# Patient Record
Sex: Female | Born: 1985 | Race: Black or African American | Hispanic: No | Marital: Married | Smoking: Never smoker
Health system: Southern US, Community
[De-identification: ages and names within clinical notes are randomized; demographics above are authoritative.]

## PROBLEM LIST (undated history)

## (undated) DIAGNOSIS — T7840XA Allergy, unspecified, initial encounter: Secondary | ICD-10-CM

## (undated) DIAGNOSIS — E119 Type 2 diabetes mellitus without complications: Secondary | ICD-10-CM

## (undated) DIAGNOSIS — J40 Bronchitis, not specified as acute or chronic: Secondary | ICD-10-CM

## (undated) DIAGNOSIS — I10 Essential (primary) hypertension: Secondary | ICD-10-CM

## (undated) HISTORY — DX: Essential (primary) hypertension: I10

## (undated) HISTORY — DX: Type 2 diabetes mellitus without complications: E11.9

## (undated) HISTORY — DX: Allergy, unspecified, initial encounter: T78.40XA

---

## 2011-05-17 ENCOUNTER — Encounter (HOSPITAL_COMMUNITY): Payer: Self-pay

## 2011-05-17 ENCOUNTER — Emergency Department (INDEPENDENT_AMBULATORY_CARE_PROVIDER_SITE_OTHER)
Admission: EM | Admit: 2011-05-17 | Discharge: 2011-05-17 | Disposition: A | Discharge status: Home / Self Care | Payer: Self-pay | Source: Home / Self Care | Attending: Emergency Medicine | Admitting: Emergency Medicine

## 2011-05-17 DIAGNOSIS — T148XXA Other injury of unspecified body region, initial encounter: Secondary | ICD-10-CM

## 2011-05-17 DIAGNOSIS — J45990 Exercise induced bronchospasm: Secondary | ICD-10-CM

## 2011-05-17 HISTORY — DX: Bronchitis, not specified as acute or chronic: J40

## 2011-05-17 MED ORDER — CYCLOBENZAPRINE HCL 10 MG PO TABS: 10.0000 mg | ORAL_TABLET | Freq: Three times a day (TID) | ORAL | Status: AC | PRN

## 2011-05-17 MED ORDER — IBUPROFEN 600 MG PO TABS: 600.0000 mg | ORAL_TABLET | Freq: Four times a day (QID) | ORAL | Status: AC | PRN

## 2011-05-17 MED ORDER — ALBUTEROL SULFATE HFA 108 (90 BASE) MCG/ACT IN AERS: 1.0000 | INHALATION_SPRAY | Freq: Four times a day (QID) | RESPIRATORY_TRACT | Status: DC | PRN

## 2011-05-17 NOTE — ED Provider Notes (Signed)
History     CSN: [id number]  Arrival date & time 05/17/11  1327   First MD Initiated Contact with Patient 05/17/11 1439      Chief Complaint  Patient presents with  . Back Pain    (Consider location/radiation/quality/duration/timing/severity/associated sxs/prior treatment) HPI Comments: Patient complains of nonradiating upper back "achiness" starting yesterday after working out on the elliptical machine for the first time last night. Pain worse with rowing motion, arm use. Has not tried anything for this. Patient also reports chest tightness, coughing, wheezing starting about 10 minutes into exercising. States this lasted throughout the entire workout, and had persistent coughing last night.  Coughed up some greenish phlegm and Dyspnea has gotten better.  No nausea, vomiting, fevers, chest pain, abdominal pain. No history of asthma. Patient recently moved to [facility] from [place]. Patient has a significant history of multiple food and medication allergies.  Patient is a 26 y.o. female presenting with back pain. The history is provided by the patient. No language interpreter was used.  Back Pain  This is a new problem. The current episode started yesterday. The problem occurs constantly. The quality of the pain is described as aching. The pain does not radiate. Pertinent negatives include no chest pain, no fever and no abdominal pain. She has tried nothing for the symptoms. The treatment provided no relief.    Past Medical History  Diagnosis Date  . Bronchitis     History reviewed. No pertinent past surgical history.  History reviewed. No pertinent family history.  History  Substance Use Topics  . Smoking status: Never Smoker   . Smokeless tobacco: Not on file  . Alcohol Use: No    OB History    Grav Para Term Preterm Abortions TAB SAB Ect Mult Living                  Review of Systems  Constitutional: Negative for fever.  HENT: Negative.   Respiratory: Positive for  chest tightness, shortness of breath and wheezing.   Cardiovascular: Negative for chest pain.  Gastrointestinal: Negative for nausea, vomiting and abdominal pain.  Musculoskeletal: Positive for back pain.    Allergies  Amoxicillin; Corn-containing products; Peanut-containing drug products; Penicillins; and Wheat  Home Medications   Current Outpatient Rx  Name Route Sig Dispense Refill  . LEVONORGESTREL-ETHINYL ESTRAD 0.15-30 MG-MCG PO TABS Oral Take 1 tablet by mouth daily.    . ALBUTEROL SULFATE HFA 108 (90 BASE) MCG/ACT IN AERS Inhalation Inhale 1-2 puffs into the lungs every 6 (six) hours as needed for wheezing. 1 Inhaler 0  . CYCLOBENZAPRINE HCL 10 MG PO TABS Oral Take 1 tablet (10 mg total) by mouth 3 (three) times daily as needed for muscle spasms. 20 tablet 0  . IBUPROFEN 600 MG PO TABS Oral Take 1 tablet (600 mg total) by mouth every 6 (six) hours as needed for pain. 30 tablet 0    BP 126/71  Pulse 74  Temp(Src) 98.3 F (36.8 C) (Oral)  Resp 18  SpO2 100%  Physical Exam  Nursing note and vitals reviewed. Constitutional: She is oriented to person, place, and time. She appears well-developed and well-nourished.  HENT:  Head: Normocephalic and atraumatic.  Eyes: Conjunctivae and EOM are normal.  Neck: Normal range of motion.  Cardiovascular: Normal rate, regular rhythm, normal heart sounds and intact distal pulses.   No murmur heard. Pulmonary/Chest: Effort normal and breath sounds normal. No respiratory distress.  Abdominal: Soft. Bowel sounds are normal.  Musculoskeletal:  Normal range of motion. She exhibits no edema and no tenderness.       Cervical back: She exhibits tenderness. She exhibits normal range of motion, no bony tenderness and no spasm.       Back:       Tenderness along rhomboids b/l. Pain aggravated with rowing motion  Neurological: She is alert and oriented to person, place, and time.  Skin: Skin is warm and dry.  Psychiatric: She has a normal mood  and affect. Her behavior is normal. Judgment and thought content normal.    ED Course  Procedures (including critical care time)  Labs Reviewed - No data to display No results found.   1. Exercise induced bronchospasm   2. Muscle strain     MDM    Luiz Blare, MD 05/17/11 [phone]

## 2011-05-17 NOTE — ED Notes (Addendum)
Reports she had pushed herself at the gym last PM, and since then , she felt as if she was having a problem getting a good breath, her breathing felt tight, and she was having some wheezing. Reportedly brought up clear and yellow tinted , grainy textured phlegm, breathing some better after drinking hot tea; on ascultation, ?faint wheezing noted left upper chest

## 2012-10-16 ENCOUNTER — Emergency Department (INDEPENDENT_AMBULATORY_CARE_PROVIDER_SITE_OTHER)
Admission: EM | Admit: 2012-10-16 | Discharge: 2012-10-16 | Disposition: A | Discharge status: Home / Self Care | Payer: Self-pay | Source: Home / Self Care | Attending: Family Medicine | Admitting: Family Medicine

## 2012-10-16 ENCOUNTER — Encounter (HOSPITAL_COMMUNITY): Payer: Self-pay | Admitting: Emergency Medicine

## 2012-10-16 DIAGNOSIS — J029 Acute pharyngitis, unspecified: Secondary | ICD-10-CM

## 2012-10-16 DIAGNOSIS — J309 Allergic rhinitis, unspecified: Secondary | ICD-10-CM

## 2012-10-16 MED ORDER — PREDNISOLONE SODIUM PHOSPHATE 15 MG/5ML PO SOLN: 30.0000 mg | Freq: Every day | ORAL | Status: AC

## 2012-10-16 MED ORDER — FLUTICASONE PROPIONATE 50 MCG/ACT NA SUSP: 2.0000 | Freq: Every day | NASAL | Status: DC

## 2012-10-16 MED ORDER — CHLORPHENIRAMINE-PSE-IBUPROFEN 2-30-200 MG PO TABS: ORAL_TABLET | ORAL | Status: DC

## 2012-10-16 MED ORDER — EPINEPHRINE 0.3 MG/0.3ML IJ SOAJ: 0.3000 mg | Freq: Once | INTRAMUSCULAR | Status: DC

## 2012-10-16 MED ORDER — METHYLPREDNISOLONE 4 MG PO KIT: PACK | ORAL | Status: DC

## 2012-10-16 NOTE — ED Provider Notes (Signed)
History    CSN: [id number] Arrival date & time 10/16/12  1620  First MD Initiated Contact with Patient 10/16/12 1639     Chief Complaint  Patient presents with  . Sore Throat   (Consider location/radiation/quality/duration/timing/severity/associated sxs/prior Treatment) HPI Comments: 27 year old female presents complaining of tingling and soreness in the right side of her throat. She has a long history of this happening approximately once per year, usually associated with airborne pollen or grass allergy. She says she was recently sick with a cold for the past couple of days but today she feels much better. She says this problem and her throat is different, it feels intermittently tingling and dry. She denies any significant pain in the throat, fever, cough, chills, NVD, abdominal pain. Denies any shortness of breath or feelings of throat swelling  Patient is a 27 y.o. female presenting with pharyngitis.  Sore Throat Pertinent negatives include no chest pain, no abdominal pain and no shortness of breath.   Past Medical History  Diagnosis Date  . Bronchitis    History reviewed. No pertinent past surgical history. No family history on file. History  Substance Use Topics  . Smoking status: Never Smoker   . Smokeless tobacco: Not on file  . Alcohol Use: No   OB History   Grav Para Term Preterm Abortions TAB SAB Ect Mult Living                 Review of Systems  Constitutional: Negative for fever and chills.  HENT: Positive for sore throat (Tingling). Negative for ear pain, congestion, rhinorrhea, sneezing, mouth sores, neck pain, voice change and sinus pressure.   Eyes: Negative for visual disturbance.  Respiratory: Negative for cough and shortness of breath.   Cardiovascular: Negative for chest pain, palpitations and leg swelling.  Gastrointestinal: Negative for nausea, vomiting and abdominal pain.  Endocrine: Negative for polydipsia and polyuria.  Genitourinary: Negative  for dysuria, urgency and frequency.  Musculoskeletal: Negative for myalgias and arthralgias.  Skin: Negative for rash.  Neurological: Negative for dizziness, weakness and light-headedness.    Allergies  Amoxicillin; Corn-containing products; Peanut-containing drug products; Penicillins; Shellfish allergy; and Wheat  Home Medications   Current Outpatient Rx  Name  Route  Sig  Dispense  Refill  . levonorgestrel-ethinyl estradiol (NORDETTE) 0.15-30 MG-MCG tablet   Oral   Take 1 tablet by mouth daily.         Marland Kitchen EXPIRED: albuterol (PROVENTIL HFA;VENTOLIN HFA) 108 (90 BASE) MCG/ACT inhaler   Inhalation   Inhale 1-2 puffs into the lungs every 6 (six) hours as needed for wheezing.   1 Inhaler   0   . Chlorpheniramine-PSE-Ibuprofen (ADVIL ALLERGY SINUS) 2-30-200 MG TABS      2 tabs by mouth Q6 hours when necessary   84 each   0   . EPINEPHrine (EPIPEN 2-PAK) 0.3 mg/0.3 mL SOAJ   Intramuscular   Inject 0.3 mLs (0.3 mg total) into the muscle once.   1 Device   0   . fluticasone (FLONASE) 50 MCG/ACT nasal spray   Nasal   Place 2 sprays into the nose daily.   16 g   2   . prednisoLONE (ORAPRED) 15 MG/5ML solution   Oral   Take 10 mLs (30 mg total) by mouth daily.   50 mL   0    BP 137/87  Pulse 85  Temp(Src) 98.3 F (36.8 C) (Oral)  Resp 16  SpO2 100%  LMP 10/06/2012 Physical Exam  Nursing note  and vitals reviewed. Constitutional: She is oriented to person, place, and time. Vital signs are normal. She appears well-developed and well-nourished. No distress.  HENT:  Head: Normocephalic and atraumatic.  Right Ear: Hearing and ear canal normal. A middle ear effusion (serous) is present.  Left Ear: Hearing, tympanic membrane and ear canal normal.  Mouth/Throat: Uvula is midline and oropharynx is clear and moist. No oropharyngeal exudate, posterior oropharyngeal edema or posterior oropharyngeal erythema.  Eyes: EOM are normal. Pupils are equal, round, and reactive to  light.  Cardiovascular: Normal rate, regular rhythm and normal heart sounds.  Exam reveals no gallop and no friction rub.   No murmur heard. Pulmonary/Chest: Effort normal and breath sounds normal. No respiratory distress. She has no wheezes. She has no rales.  Abdominal: Soft. There is no tenderness.  Neurological: She is alert and oriented to person, place, and time. She has normal strength.  Skin: Skin is warm and dry. She is not diaphoretic.  Psychiatric: She has a normal mood and affect. Her behavior is normal. Judgment normal.    ED Course  Procedures (including critical care time) Labs Reviewed  POCT RAPID STREP A ([facility] CARE ONLY)   No results found. 1. Pharyngitis   2. Allergic rhinitis     MDM  With a normal physical exam and vital signs, this is most likely allergic eustachian tube irritation. We'll treat as such and have her followup if she does not improve. The EpiPen is at her request, she states she used to always keep one to 2 her severe peanut allergy but it has expired  Meds ordered this encounter  Medications                                               . fluticasone (FLONASE) 50 MCG/ACT nasal spray    Sig: Place 2 sprays into the nose daily.    Dispense:  16 g    Refill:  2  . Chlorpheniramine-PSE-Ibuprofen (ADVIL ALLERGY SINUS) 2-30-200 MG TABS    Sig: 2 tabs by mouth Q6 hours when necessary    Dispense:  84 each    Refill:  0  . prednisoLONE (ORAPRED) 15 MG/5ML solution    Sig: Take 10 mLs (30 mg total) by mouth daily.    Dispense:  50 mL    Refill:  0  . EPINEPHrine (EPIPEN 2-PAK) 0.3 mg/0.3 mL SOAJ    Sig: Inject 0.3 mLs (0.3 mg total) into the muscle once.    Dispense:  1 Device    Refill:  0     Graylon Good, PA-C 10/16/12 1746

## 2012-10-16 NOTE — ED Notes (Signed)
Pt c/o throat pain that feels swollen. This is a recurrent problem since 2007. Has tried saltwater rinse with no relief. Feels discomfort on the right side especially when she lies down. Today she felt numbness and tingling in her throat. Throat has closed before. In no acute respiratory distress. Saw ENT doctor in [place], who encouraged her to have tonsillectomy. Previously diagnosed with viral infection. Patient is alert and oriented.

## 2012-10-17 NOTE — ED Provider Notes (Signed)
Medical screening examination/treatment/procedure(s) were performed by resident physician or non-physician practitioner and as supervising physician I was immediately available for consultation/collaboration.   Barkley Bruns MD.   Linna Hoff, MD 10/17/12 [phone]

## 2012-10-18 LAB — CULTURE, GROUP A STREP

## 2012-10-18 NOTE — ED Notes (Signed)
Patient unable to get epi pen, give patient numbers for clinics in the area to establish pcp

## 2013-03-27 ENCOUNTER — Emergency Department (HOSPITAL_COMMUNITY): Payer: Self-pay

## 2013-03-27 ENCOUNTER — Encounter (HOSPITAL_COMMUNITY): Payer: Self-pay | Admitting: Emergency Medicine

## 2013-03-27 ENCOUNTER — Emergency Department (HOSPITAL_COMMUNITY)
Admission: EM | Admit: 2013-03-27 | Discharge: 2013-03-28 | Disposition: A | Discharge status: Home / Self Care | Payer: Self-pay | Attending: Emergency Medicine | Admitting: Emergency Medicine

## 2013-03-27 DIAGNOSIS — M546 Pain in thoracic spine: Secondary | ICD-10-CM | POA: Insufficient documentation

## 2013-03-27 DIAGNOSIS — R0789 Other chest pain: Secondary | ICD-10-CM | POA: Insufficient documentation

## 2013-03-27 DIAGNOSIS — Z79899 Other long term (current) drug therapy: Secondary | ICD-10-CM | POA: Insufficient documentation

## 2013-03-27 DIAGNOSIS — R05 Cough: Secondary | ICD-10-CM | POA: Insufficient documentation

## 2013-03-27 DIAGNOSIS — R059 Cough, unspecified: Secondary | ICD-10-CM | POA: Insufficient documentation

## 2013-03-27 DIAGNOSIS — R0682 Tachypnea, not elsewhere classified: Secondary | ICD-10-CM | POA: Insufficient documentation

## 2013-03-27 DIAGNOSIS — Z8709 Personal history of other diseases of the respiratory system: Secondary | ICD-10-CM | POA: Insufficient documentation

## 2013-03-27 DIAGNOSIS — R062 Wheezing: Secondary | ICD-10-CM | POA: Insufficient documentation

## 2013-03-27 DIAGNOSIS — Z880 Allergy status to penicillin: Secondary | ICD-10-CM | POA: Insufficient documentation

## 2013-03-27 DIAGNOSIS — IMO0002 Reserved for concepts with insufficient information to code with codable children: Secondary | ICD-10-CM | POA: Insufficient documentation

## 2013-03-27 DIAGNOSIS — R0602 Shortness of breath: Secondary | ICD-10-CM | POA: Insufficient documentation

## 2013-03-27 DIAGNOSIS — R000 Tachycardia, unspecified: Secondary | ICD-10-CM | POA: Insufficient documentation

## 2013-03-27 MED ORDER — IPRATROPIUM BROMIDE 0.02 % IN SOLN
0.5000 mg | Freq: Once | RESPIRATORY_TRACT | Status: AC
  Administered 2013-03-27: 0.5 mg via RESPIRATORY_TRACT
  Filled 2013-03-27: qty 2.5

## 2013-03-27 MED ORDER — ALBUTEROL SULFATE (5 MG/ML) 0.5% IN NEBU
5.0000 mg | INHALATION_SOLUTION | Freq: Once | RESPIRATORY_TRACT | Status: AC
  Administered 2013-03-27: 5 mg via RESPIRATORY_TRACT
  Filled 2013-03-27: qty 1

## 2013-03-27 MED ORDER — IBUPROFEN 800 MG PO TABS
800.0000 mg | ORAL_TABLET | Freq: Once | ORAL | Status: AC
  Administered 2013-03-27: 800 mg via ORAL
  Filled 2013-03-27: qty 1

## 2013-03-27 MED ORDER — IPRATROPIUM BROMIDE 0.02 % IN SOLN
0.5000 mg | Freq: Once | RESPIRATORY_TRACT | Status: AC
  Administered 2013-03-28: 0.5 mg via RESPIRATORY_TRACT
  Filled 2013-03-27: qty 2.5

## 2013-03-27 MED ORDER — PREDNISONE 20 MG PO TABS
60.0000 mg | ORAL_TABLET | Freq: Once | ORAL | Status: AC
  Administered 2013-03-27: 60 mg via ORAL
  Filled 2013-03-27: qty 3

## 2013-03-27 MED ORDER — ALBUTEROL SULFATE (5 MG/ML) 0.5% IN NEBU: 5.0000 mg | INHALATION_SOLUTION | Freq: Once | RESPIRATORY_TRACT | Status: DC

## 2013-03-27 MED ORDER — ALBUTEROL SULFATE (5 MG/ML) 0.5% IN NEBU
2.5000 mg | INHALATION_SOLUTION | Freq: Once | RESPIRATORY_TRACT | Status: AC
  Administered 2013-03-27: 5 mg via RESPIRATORY_TRACT
  Filled 2013-03-27: qty 0.5

## 2013-03-27 MED ORDER — ALBUTEROL SULFATE (5 MG/ML) 0.5% IN NEBU
2.5000 mg | INHALATION_SOLUTION | Freq: Once | RESPIRATORY_TRACT | Status: AC
  Administered 2013-03-28: 2.5 mg via RESPIRATORY_TRACT
  Filled 2013-03-27: qty 0.5
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
## 2013-03-28 MED ORDER — ALBUTEROL SULFATE HFA 108 (90 BASE) MCG/ACT IN AERS
2.0000 | INHALATION_SPRAY | RESPIRATORY_TRACT | Status: DC | PRN
  Administered 2013-03-28: 2 via RESPIRATORY_TRACT
  Filled 2013-03-28: qty 6.7

## 2013-03-28 MED ORDER — PREDNISONE 20 MG PO TABS: 40.0000 mg | ORAL_TABLET | Freq: Every day | ORAL | Status: DC

## 2013-03-28 NOTE — ED Provider Notes (Signed)
Medical screening examination/treatment/procedure(s) were performed by non-physician practitioner and as supervising physician I was immediately available for consultation/collaboration.    Mittie Knittel R Amaya Blakeman, MD 03/28/13 1530 

## 2013-03-30 ENCOUNTER — Emergency Department (HOSPITAL_COMMUNITY): Payer: Self-pay

## 2013-03-30 ENCOUNTER — Encounter (HOSPITAL_COMMUNITY): Payer: Self-pay | Admitting: Emergency Medicine

## 2013-03-30 ENCOUNTER — Emergency Department (HOSPITAL_COMMUNITY)
Admission: EM | Admit: 2013-03-30 | Discharge: 2013-03-30 | Disposition: A | Discharge status: Home / Self Care | Payer: Self-pay | Attending: Emergency Medicine | Admitting: Emergency Medicine

## 2013-03-30 DIAGNOSIS — Z79899 Other long term (current) drug therapy: Secondary | ICD-10-CM | POA: Insufficient documentation

## 2013-03-30 DIAGNOSIS — Z880 Allergy status to penicillin: Secondary | ICD-10-CM | POA: Insufficient documentation

## 2013-03-30 DIAGNOSIS — IMO0002 Reserved for concepts with insufficient information to code with codable children: Secondary | ICD-10-CM | POA: Insufficient documentation

## 2013-03-30 DIAGNOSIS — J40 Bronchitis, not specified as acute or chronic: Secondary | ICD-10-CM | POA: Insufficient documentation

## 2013-03-30 MED ORDER — GUAIFENESIN ER 600 MG PO TB12: 1200.0000 mg | ORAL_TABLET | Freq: Two times a day (BID) | ORAL | Status: DC
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
## 2013-03-30 NOTE — ED Provider Notes (Signed)
CSN: [id number]     Arrival date & time 03/30/13  1502 History   First MD Initiated Contact with Patient 03/30/13 2046     Chief Complaint  Patient presents with  . Shortness of Breath   HPI  History provided by the patient in recent medical chart. Patient is a 27 year old female who returns with complaints of some persistent episodes of shortness of breath and wheezing. Patient reports that she was seen in emergency department on the 24th for similar symptoms. She states that she first began having some increasing cough and wheezing symptoms shortly after using an insect bomb in her house on the 19th. Her symptoms were persistent and she was treated in emergency departments with breathing treatments and prednisone. She has continued to use albuterol inhaler at home and prednisone and reported having improvements. This morning she does report having one episode of tightness and wheezing that was difficult to improve with albuterol inhaler. She did use her boyfriend's grandfather's home nebulizer with albuterol and this seemed to improve symptoms. Because she was continuing to have problems throughout the day she was concerned and came for reevaluation. She denies having any other change in symptoms. Denies any chest pain. Denies any hemoptysis. Denies any fever, chills or sweats. She did use her last albuterol treatment shortly before arrival.    Past Medical History  Diagnosis Date  . Bronchitis    History reviewed. No pertinent past surgical history. History reviewed. No pertinent family history. History  Substance Use Topics  . Smoking status: Never Smoker   . Smokeless tobacco: Not on file  . Alcohol Use: No   OB History   Grav Para Term Preterm Abortions TAB SAB Ect Mult Living                 Review of Systems  Constitutional: Negative for fever, chills, diaphoresis and fatigue.  Respiratory: Positive for cough, shortness of breath and wheezing. Negative for stridor.    Cardiovascular: Negative for chest pain, palpitations and leg swelling.  Gastrointestinal: Negative for nausea, vomiting and abdominal pain.  All other systems reviewed and are negative.    Allergies  Amoxicillin; Corn-containing products; Penicillins; Peanut-containing drug products; Shellfish allergy; and Wheat  Home Medications   Current Outpatient Rx  Name  Route  Sig  Dispense  Refill  . albuterol (PROVENTIL HFA;VENTOLIN HFA) 108 (90 BASE) MCG/ACT inhaler   Inhalation   Inhale 1-2 puffs into the lungs every 6 (six) hours as needed for wheezing or shortness of breath.         . EPINEPHrine (EPIPEN) 0.3 mg/0.3 mL SOAJ injection   Intramuscular   Inject 0.3 mg into the muscle once as needed (for severe allergic reaction).         Marland Kitchen levonorgestrel-ethinyl estradiol (NORDETTE) 0.15-30 MG-MCG tablet   Oral   Take 1 tablet by mouth daily.         . predniSONE (DELTASONE) 20 MG tablet   Oral   Take 2 tablets (40 mg total) by mouth daily.   10 tablet   0    BP 115/68  Pulse 83  Temp(Src) 98.9 F (37.2 C) (Oral)  Resp 20  SpO2 99%  LMP 03/30/2013 Physical Exam  Nursing note and vitals reviewed. Constitutional: She is oriented to person, place, and time. She appears well-developed and well-nourished. No distress.  HENT:  Head: Normocephalic.  Cardiovascular: Normal rate and regular rhythm.   Pulmonary/Chest: Effort normal. No respiratory distress. She has wheezes. She has  no rales. She exhibits no tenderness.  Very slight expiratory wheeze bilaterally  Abdominal: Soft. There is no tenderness. There is no rebound and no guarding.  Musculoskeletal: Normal range of motion. She exhibits no edema and no tenderness.  Clinical signs concerning for DVT  Neurological: She is alert and oriented to person, place, and time.  Skin: Skin is warm and dry. No rash noted.  Psychiatric: She has a normal mood and affect. Her behavior is normal.    ED Course  Procedures    DIAGNOSTIC STUDIES: Oxygen Saturation is 99% on room air.    COORDINATION OF CARE:  Nursing notes reviewed. Vital signs reviewed. Initial pt interview and examination performed.   9:06 PM-patient seen and evaluated. She is resting comfortably in no distress. Normal respirations and O2 sats on room air. She has normal heart rate at rest in the 80s. Patient reported using albuterol just prior to arrival to the emergency department. She was slightly tachycardic at arrival.   Chest x-ray reviewed and discussed with the patient. No signs of pneumonia other concerning findings. Patient without any significant clinical concerns for PE. She has slight expiratory wheezes bilaterally. At this time we'll plan for recommendations of continued treatment as well as adding Mucinex for her clear sputum.   Imaging Review Dg Chest 2 View  03/30/2013   CLINICAL DATA:  Shortness of breath.  EXAM: CHEST  2 VIEW  COMPARISON:  March 27, 2013.  FINDINGS: The heart size and mediastinal contours are within normal limits. Both lungs are clear. No pleural effusion or pneumothorax is noted. The visualized skeletal structures are unremarkable.  IMPRESSION: No acute cardiopulmonary abnormality seen.   Electronically Signed   By: Roque Lias M.D.   On: 03/30/2013 16:23    EKG Interpretation   None      Date: 03/30/2013  Rate: 96  Rhythm: normal sinus rhythm  QRS Axis: normal  Intervals: normal  ST/T Wave abnormalities: normal  Conduction Disutrbances:none  Narrative Interpretation: borderline left atrial enlargement  Old EKG Reviewed: none available    MDM   1. Bronchitis        Angus Seller, PA-C 03/30/13 2135

## 2013-03-31 NOTE — ED Provider Notes (Signed)
Medical screening examination/treatment/procedure(s) were performed by non-physician practitioner and as supervising physician I was immediately available for consultation/collaboration.  EKG Interpretation   None         Gavin Pound. Laela Deviney, MD 03/31/13 1356

## 2013-04-13 ENCOUNTER — Emergency Department (HOSPITAL_COMMUNITY)
Admission: EM | Admit: 2013-04-13 | Discharge: 2013-04-13 | Disposition: A | Discharge status: Home / Self Care | Payer: Self-pay | Attending: Emergency Medicine | Admitting: Emergency Medicine

## 2013-04-13 ENCOUNTER — Emergency Department (HOSPITAL_COMMUNITY): Payer: Managed Care, Other (non HMO)

## 2013-04-13 ENCOUNTER — Encounter (HOSPITAL_COMMUNITY): Payer: Self-pay | Admitting: Emergency Medicine

## 2013-04-13 DIAGNOSIS — Z79899 Other long term (current) drug therapy: Secondary | ICD-10-CM | POA: Insufficient documentation

## 2013-04-13 DIAGNOSIS — Z880 Allergy status to penicillin: Secondary | ICD-10-CM | POA: Insufficient documentation

## 2013-04-13 DIAGNOSIS — R0602 Shortness of breath: Secondary | ICD-10-CM | POA: Insufficient documentation

## 2013-04-13 DIAGNOSIS — R062 Wheezing: Secondary | ICD-10-CM

## 2013-04-13 DIAGNOSIS — Z8709 Personal history of other diseases of the respiratory system: Secondary | ICD-10-CM | POA: Insufficient documentation

## 2013-04-13 DIAGNOSIS — F411 Generalized anxiety disorder: Secondary | ICD-10-CM | POA: Insufficient documentation

## 2013-04-13 MED ORDER — AZITHROMYCIN 250 MG PO TABS
250.0000 mg | ORAL_TABLET | Freq: Every day | ORAL | Status: DC
Start: 2013-04-13 — End: 2014-03-15

## 2013-04-13 MED ORDER — ALBUTEROL SULFATE (2.5 MG/3ML) 0.083% IN NEBU
5.0000 mg | INHALATION_SOLUTION | Freq: Once | RESPIRATORY_TRACT | Status: AC
  Administered 2013-04-13: 5 mg via RESPIRATORY_TRACT
  Filled 2013-04-13: qty 6

## 2013-04-13 MED ORDER — PREDNISONE 20 MG PO TABS
60.0000 mg | ORAL_TABLET | Freq: Once | ORAL | Status: AC
  Administered 2013-04-13: 60 mg via ORAL
  Filled 2013-04-13: qty 3

## 2013-04-13 MED ORDER — ALBUTEROL SULFATE HFA 108 (90 BASE) MCG/ACT IN AERS: 2.0000 | INHALATION_SPRAY | RESPIRATORY_TRACT | Status: DC | PRN

## 2013-04-13 MED ORDER — PREDNISONE 20 MG PO TABS
40.0000 mg | ORAL_TABLET | Freq: Every day | ORAL | Status: DC
Start: 2013-04-13 — End: 2014-03-15

## 2013-04-13 MED ORDER — IPRATROPIUM BROMIDE 0.02 % IN SOLN
0.5000 mg | Freq: Once | RESPIRATORY_TRACT | Status: AC
  Administered 2013-04-13: 0.5 mg via RESPIRATORY_TRACT
  Filled 2013-04-13: qty 2.5

## 2013-04-13 MED ORDER — ALBUTEROL SULFATE HFA 108 (90 BASE) MCG/ACT IN AERS
2.0000 | INHALATION_SPRAY | RESPIRATORY_TRACT | Status: DC | PRN
  Administered 2013-04-13: 2 via RESPIRATORY_TRACT
  Filled 2013-04-13: qty 6.7

## 2013-04-13 NOTE — ED Provider Notes (Signed)
CSN: [id number]     Arrival date & time 04/13/13  1739 History  This chart was scribed for non-physician practitioner, Roxy Horsemanobert Adalee Kathan, PA-C working with Candyce ChurnJohn David Wofford, MD by Greggory StallionKayla Andersen, ED scribe. This patient was seen in room TR06C/TR06C and the patient's care was started at 9:53 PM.   Chief Complaint  Patient presents with  . Cough  . Shortness of Breath   The history is provided by the patient. No language interpreter was used.   HPI Comments: Kathleen HousekeeperJessica [name] is a 28 y.o. female who presents to the Emergency Department complaining of SOB, wheezing and productive cough of white sputum that started about 3 weeks ago. Pt states she put pesticide bombs in her apartment in December before her symptoms started. States she was seen here on 12/24 and 12/26 for the same and was diagnosed with bronchitis at one of those visits. Pt has taken Mucinex 600 mg and an albuterol inhaler with no relief. Denies fever. Denies history of asthma.   Past Medical History  Diagnosis Date  . Bronchitis    History reviewed. No pertinent past surgical history. History reviewed. No pertinent family history. History  Substance Use Topics  . Smoking status: Never Smoker   . Smokeless tobacco: Not on file  . Alcohol Use: No   OB History   Grav Para Term Preterm Abortions TAB SAB Ect Mult Living                 Review of Systems A complete 10 system review of systems was obtained and all systems are negative except as noted in the HPI and PMH.   Allergies  Amoxicillin; Corn-containing products; Penicillins; Peanut-containing drug products; Shellfish allergy; and Wheat  Home Medications   Current Outpatient Rx  Name  Route  Sig  Dispense  Refill  . albuterol (PROVENTIL HFA;VENTOLIN HFA) 108 (90 BASE) MCG/ACT inhaler   Inhalation   Inhale 1-2 puffs into the lungs every 6 (six) hours as needed for wheezing or shortness of breath.         . EPINEPHrine (EPIPEN) 0.3 mg/0.3 mL SOAJ injection  Intramuscular   Inject 0.3 mg into the muscle once as needed (for severe allergic reaction).         Marland Kitchen. guaiFENesin (MUCINEX) 600 MG 12 hr tablet   Oral   Take 2 tablets (1,200 mg total) by mouth 2 (two) times daily.   40 tablet   0   . levonorgestrel-ethinyl estradiol (NORDETTE) 0.15-30 MG-MCG tablet   Oral   Take 1 tablet by mouth daily.         . Pseudoephedrine-APAP-DM (DAYQUIL MULTI-SYMPTOM PO)   Oral   Take 30 mLs by mouth daily as needed (for cough).          BP 125/79  Pulse 86  Temp(Src) 98.8 F (37.1 C) (Oral)  Resp 20  Wt 208 lb 7 oz (94.547 kg)  SpO2 97%  LMP 03/30/2013  Physical Exam  Nursing note and vitals reviewed. Constitutional: She is oriented to person, place, and time. She appears well-developed and well-nourished. No distress.  HENT:  Head: Normocephalic and atraumatic.  Eyes: EOM are normal.  Neck: Neck supple. No tracheal deviation present.  Cardiovascular: Normal rate, regular rhythm and normal heart sounds.  Exam reveals no gallop and no friction rub.   No murmur heard. Pulmonary/Chest: Effort normal. No respiratory distress. She has wheezes in the right upper field, the right middle field, the right lower field, the left upper field,  the left middle field and the left lower field.  Abdominal: She exhibits no distension.  Musculoskeletal: Normal range of motion.  Neurological: She is alert and oriented to person, place, and time.  Skin: Skin is warm and dry.  Psychiatric: She has a normal mood and affect. Her behavior is normal.  Anxious.    ED Course  Procedures (including critical care time)  DIAGNOSTIC STUDIES: Oxygen Saturation is 97% on RA, normal by my interpretation.    COORDINATION OF CARE: 10:01 PM-Discussed treatment plan which includes breathing treatment and prednisone with pt at bedside and pt agreed to plan. Advised pt to follow up with a pulmonologist if symptoms do not resolve.   Labs Review Labs Reviewed - No data  to display Imaging Review Dg Chest 2 View  04/13/2013   CLINICAL DATA:  Cough and chest tightness  EXAM: CHEST  2 VIEW  COMPARISON:  04/30/2012  FINDINGS: The heart size and mediastinal contours are within normal limits. Both lungs are clear. The visualized skeletal structures are unremarkable.  IMPRESSION: No active cardiopulmonary disease.   Electronically Signed   By: Alcide Clever M.D.   On: 04/13/2013 18:36    EKG Interpretation   None       MDM   1. Wheezing    Patient was wheezing. She states it is causing her to be short of breath. She denies fevers or chills. Wheezing is improved with her inhaler. She's never been diagnosed with, but says that she has lost about. As the symptoms have been ongoing for several weeks, and come and go.  Patient symptoms are much improved with nebulizer treatment. I'm going to discharge her to home with additional albuterol inhalers and prednisone. Return precautions are given. Will give her pulmonology followup. Patient is stable and ready for discharge.  I personally performed the services described in this documentation, which was scribed in my presence. The recorded information has been reviewed and is accurate.    Roxy Horseman, PA-C 04/13/13 2256

## 2013-04-13 NOTE — Discharge Instructions (Signed)
Bronchospasm, Adult °A bronchospasm is when the tubes that carry air in and out of your lungs (airwarys) spasm or tighten. During a bronchospasm it is hard to breathe. This is because the airways get smaller. A bronchospasm can be triggered by: °· Allergies. These may be to animals, pollen, food, or mold. °· Infection. This is a common cause of bronchospasm. °· Exercise. °· Irritants. These include pollution, cigarette smoke, strong odors, aerosol sprays, and paint fumes. °· Weather changes. °· Stress. °· Being emotional. °HOME CARE  °· Always have a plan for getting help. Know when to call your doctor and local emergency services (911 in the [place] Know where you can get emergency care. °· Only take medicines as told by your doctor. °· If you were prescribed an inhaler or nebulizer machine, ask your doctor how to use it correctly. Always use a spacer with your inhaler if you were given one. °· Stay calm during an attack. Try to relax and breathe more slowly. °· Control your home environment: °· Change your heating and air conditioning filter at least once a month. °· Limit your use of fireplaces and wood stoves. °· Do not  smoke. Do not  allow smoking in your home. °· Avoid perfumes and fragrances. °· Get rid of pests (such as roaches and mice) and their droppings. °· Throw away plants if you see mold on them. °· Keep your house clean and dust free. °· Replace carpet with wood, tile, or vinyl flooring. Carpet can trap dander and dust. °· Use allergy-proof pillows, mattress covers, and box spring covers. °· Wash bed sheets and blankets every week in hot water. Dry them in a dryer. °· Use blankets that are made of polyester or cotton. °· Wash hands frequently. °GET HELP IF: °· You have muscle aches. °· You have chest pain. °· The thick spit you spit or cough up (sputum) changes from clear or white to yellow, green, gray, or bloody. °· The thick spit you spit or cough up gets thicker. °· There are problems that may be  related to the medicine you are given such as: °· A rash. °· Itching. °· Swelling. °· Trouble breathing. °GET HELP RIGHT AWAY IF: °· You feel you cannot breathe or catch your breath. °· You cannot stop coughing. °· Your treatment is not helping you breathe better. °MAKE SURE YOU:  °· Understand these instructions. °· Will watch your condition. °· Will get help right away if you are not doing well or get worse. °Document Released: 01/17/2009 Document Revised: 11/22/2012 Document Reviewed: 09/12/2012 °[employer]® Patient Information ©2014 [employer]. ° °

## 2013-04-13 NOTE — ED Notes (Signed)
Pt c/o  Productive cough with white colored phlegm and SOB, the need to exercise to feel better, states "I am tired of having to exercise to feel better."  Pt seen here on the 03/28/2013 and 03/30/2013 for the same, pt states she was diagnosed with Bronchitis during one of those visits. Pt denies any new or worsening symptoms just ongoing symptoms.

## 2013-04-14 NOTE — ED Provider Notes (Signed)
Medical screening examination/treatment/procedure(s) were performed by non-physician practitioner and as supervising physician I was immediately available for consultation/collaboration.  EKG Interpretation   None         Candyce ChurnJohn David Ayse Mccartin, MD 04/14/13 [phone]

## 2014-03-11 ENCOUNTER — Ambulatory Visit: Payer: 59

## 2014-03-15 ENCOUNTER — Ambulatory Visit (INDEPENDENT_AMBULATORY_CARE_PROVIDER_SITE_OTHER): Payer: 59 | Admitting: Family Medicine

## 2014-03-15 VITALS — BP 122/74 | HR 88 | Temp 98.4°F | Resp 18 | Ht 65.0 in | Wt 232.2 lb

## 2014-03-15 DIAGNOSIS — Z6841 Body Mass Index (BMI) 40.0 and over, adult: Secondary | ICD-10-CM

## 2014-03-15 DIAGNOSIS — Z131 Encounter for screening for diabetes mellitus: Secondary | ICD-10-CM

## 2014-03-15 DIAGNOSIS — Z130 Encounter for screening for diseases of the blood and blood-forming organs and certain disorders involving the immune mechanism: Secondary | ICD-10-CM

## 2014-03-15 DIAGNOSIS — Z0000 Encounter for general adult medical examination without abnormal findings: Secondary | ICD-10-CM

## 2014-03-15 DIAGNOSIS — E669 Obesity, unspecified: Secondary | ICD-10-CM

## 2014-03-15 DIAGNOSIS — N926 Irregular menstruation, unspecified: Secondary | ICD-10-CM

## 2014-03-15 DIAGNOSIS — Z124 Encounter for screening for malignant neoplasm of cervix: Secondary | ICD-10-CM

## 2014-03-15 DIAGNOSIS — Z13220 Encounter for screening for lipoid disorders: Secondary | ICD-10-CM

## 2014-03-15 DIAGNOSIS — Z1329 Encounter for screening for other suspected endocrine disorder: Secondary | ICD-10-CM

## 2014-03-15 LAB — COMPREHENSIVE METABOLIC PANEL
ALBUMIN: 3.8 g/dL (ref 3.5–5.2)
ALT: 28 U/L (ref 0–35)
AST: 22 U/L (ref 0–37)
Alkaline Phosphatase: 97 U/L (ref 39–117)
BUN: 9 mg/dL (ref 6–23)
CALCIUM: 9 mg/dL (ref 8.4–10.5)
CHLORIDE: 106 meq/L (ref 96–112)
CO2: 22 mEq/L (ref 19–32)
Creat: 0.78 mg/dL (ref 0.50–1.10)
Glucose, Bld: 94 mg/dL (ref 70–99)
POTASSIUM: 4.7 meq/L (ref 3.5–5.3)
SODIUM: 138 meq/L (ref 135–145)
Total Bilirubin: 0.4 mg/dL (ref 0.2–1.2)
Total Protein: 6.8 g/dL (ref 6.0–8.3)

## 2014-03-15 LAB — POCT URINE PREGNANCY: Preg Test, Ur: NEGATIVE

## 2014-03-15 LAB — TSH: TSH: 2.713 u[IU]/mL (ref 0.350–4.500)

## 2014-03-15 LAB — CBC
HCT: 40.5 % (ref 36.0–46.0)
HEMOGLOBIN: 12.9 g/dL (ref 12.0–15.0)
MCH: 26.7 pg (ref 26.0–34.0)
MCHC: 31.9 g/dL (ref 30.0–36.0)
MCV: 83.7 fL (ref 78.0–100.0)
MPV: 9.7 fL (ref 9.4–12.4)
Platelets: 418 10*3/uL — ABNORMAL HIGH (ref 150–400)
RBC: 4.84 MIL/uL (ref 3.87–5.11)
RDW: 14.2 % (ref 11.5–15.5)
WBC: 7.4 10*3/uL (ref 4.0–10.5)

## 2014-03-15 LAB — LIPID PANEL
CHOL/HDL RATIO: 3.8 ratio
Cholesterol: 218 mg/dL — ABNORMAL HIGH (ref 0–200)
HDL: 57 mg/dL (ref >39)
LDL CALC: 144 mg/dL — AB (ref 0–99)
Triglycerides: 83 mg/dL (ref <150)
VLDL: 17 mg/dL (ref 0–40)

## 2014-03-15 NOTE — Progress Notes (Signed)
[facility] [address], [place] [id number] [phone]  Date:  03/15/2014   Name:  Kathleen HousekeeperJessica Canterbury   DOB:  1986-01-23   MRN:  [id number]  PCP:  No PCP Per Patient    Chief Complaint: Annual Exam   History of Present Illness:  Kathleen [name] is a 28 y.o. very pleasant female patient who presents with the following:  Here today for a CPE and labs for work.   She is fasting today for labs  She would like to have a pap today- never had an abnormal pap She is not on OCP right now. Her menses are late- last was in early October.  However she is not surprised by this as her menses do tend to be irregular. She had stopped her OCP due to some nausea  She took a home test a couple of weeks ago which was negative.   She is married and does not have any children.  She does not want to concieve right now and is open to going back on her OCP  There are no active problems to display for this patient.   Past Medical History  Diagnosis Date  . Bronchitis   . Allergy     History reviewed. No pertinent past surgical history.  History  Substance Use Topics  . Smoking status: Never Smoker   . Smokeless tobacco: Not on file  . Alcohol Use: 0.6 oz/week    1 Not specified per week    Family History  Problem Relation Age of Onset  . Diabetes Father   . Diabetes Maternal Grandmother     Allergies  Allergen Reactions  . Amoxicillin Other (See Comments)    Pain in back  . Corn-Containing Products Anaphylaxis and Hives  . Penicillins Other (See Comments)    Pain in back  . Peanut-Containing Drug Products Hives  . Shellfish Allergy Hives, Itching and Swelling  . Wheat Hives and Itching    Medication list has been reviewed and updated.  Current Outpatient Prescriptions on File Prior to Visit  Medication Sig Dispense Refill  . albuterol (PROVENTIL HFA;VENTOLIN HFA) 108 (90 BASE) MCG/ACT inhaler Inhale 1-2 puffs into the lungs every 6 (six) hours as needed  for wheezing or shortness of breath.    Marland Kitchen. albuterol (PROVENTIL HFA;VENTOLIN HFA) 108 (90 BASE) MCG/ACT inhaler Inhale 2 puffs into the lungs every 4 (four) hours as needed for wheezing or shortness of breath. (Patient not taking: Reported on 03/15/2014) 1 Inhaler 3  . azithromycin (ZITHROMAX Z-PAK) 250 MG tablet Take 1 tablet (250 mg total) by mouth daily. 500mg  PO day 1, then 250mg  PO days 205 (Patient not taking: Reported on 03/15/2014) 6 tablet 0  . EPINEPHrine (EPIPEN) 0.3 mg/0.3 mL SOAJ injection Inject 0.3 mg into the muscle once as needed (for severe allergic reaction).    Marland Kitchen. guaiFENesin (MUCINEX) 600 MG 12 hr tablet Take 2 tablets (1,200 mg total) by mouth 2 (two) times daily. (Patient not taking: Reported on 03/15/2014) 40 tablet 0  . levonorgestrel-ethinyl estradiol (NORDETTE) 0.15-30 MG-MCG tablet Take 1 tablet by mouth daily.    . predniSONE (DELTASONE) 20 MG tablet Take 2 tablets (40 mg total) by mouth daily. (Patient not taking: Reported on 03/15/2014) 10 tablet 0  . Pseudoephedrine-APAP-DM (DAYQUIL MULTI-SYMPTOM PO) Take 30 mLs by mouth daily as needed (for cough).     No current facility-administered medications on file prior to visit.    Review of Systems:  As per HPI-  otherwise negative.   Physical Examination: Filed Vitals:   03/15/14 1359  BP: 122/74  Pulse: 88  Temp: 98.4 F (36.9 C)  Resp: 18   Filed Vitals:   03/15/14 1359  Height: 5\' 5"  (1.651 m)  Weight: 232 lb 3.2 oz (105.325 kg)   Body mass index is 38.64 kg/(m^2). Ideal Body Weight: Weight in (lb) to have BMI = 25: 149.9  GEN: WDWN, NAD, Non-toxic, A & O x 3, obese but looks well HEENT: Atraumatic, Normocephalic. Neck supple. No masses, No LAD. Bilateral TM wnl, oropharynx normal.  PEERL,EOMI.   Ears and Nose: No external deformity. CV: RRR, No M/G/R. No JVD. No thrill. No extra heart sounds. PULM: CTA B, no wheezes, crackles, rhonchi. No retractions. No resp. distress. No accessory muscle use. ABD:  S, NT, ND. No rebound. No HSM. EXTR: No c/c/e NEURO Normal gait.  PSYCH: Normally interactive. Conversant. Not depressed or anxious appearing.  Calm demeanor.  Pelvic: normal, no vaginal lesions or discharge. Uterus normal, no CMT, no adnexal tendereness or masses  Results for orders placed or performed in visit on 03/15/14  POCT urine pregnancy  Result Value Ref Range   Preg Test, Ur Negative      Assessment and Plan: Physical exam  Screening for cervical cancer - Plan: Pap IG, CT/NG w/ reflex HPV when ASC-U, Hemoglobin A1c  Screening for diabetes mellitus - Plan: Comprehensive metabolic panel  Screening for hyperlipidemia - Plan: Lipid panel  Screening for hypothyroidism - Plan: TSH  Screening for deficiency anemia - Plan: CBC  Irregular menses - Plan: POCT urine pregnancy  Await her labs as above and will mail in her form for her insurance program.  She declines a flu shot today and plans to send us records for her other vaccines.   Encouraged weight loss for her health and also to help with likely PCOS. She will restart her OCP and will take a home test in 2 weeks.    Signed Abbe AmsterdamJessica Adylee Leonardo, MD

## 2014-03-15 NOTE — Patient Instructions (Signed)
Your pregnancy test is negative today.  If you would like to avoid pregnancy I would recommend that you restart your pill.  You can start this now.  Take a home test in about 2 weeks to double check that you are not pregnant.    I will be in touch with your labs and will fax in your form for you when your results come in  You likely have irregular menses due to a condition called "PCOS."  Losing weight will help to restore normal ovulation for if/ wen you want to conceive later on.   Take care!

## 2014-03-16 LAB — HEMOGLOBIN A1C
Hgb A1c MFr Bld: 6.2 % — ABNORMAL HIGH
Mean Plasma Glucose: 131 mg/dL — ABNORMAL HIGH

## 2014-03-19 ENCOUNTER — Encounter: Payer: Self-pay | Admitting: Family Medicine

## 2014-03-19 LAB — PAP IG, CT-NG, RFX HPV ASCU
Chlamydia Probe Amp: NEGATIVE
GC Probe Amp: NEGATIVE

## 2014-04-14 ENCOUNTER — Telehealth: Payer: Self-pay

## 2014-04-14 MED ORDER — LEVONORGESTREL-ETHINYL ESTRAD 0.15-30 MG-MCG PO TABS: 1.0000 | ORAL_TABLET | Freq: Every day | ORAL | Status: DC

## 2014-04-14 NOTE — Telephone Encounter (Signed)
Pt stopped by the office. Spoke with Chelle who RF'ed the BCP. Pt notified.

## 2014-04-14 NOTE — Telephone Encounter (Signed)
Meds ordered this encounter  Medications  . levonorgestrel-ethinyl estradiol (NORDETTE) 0.15-30 MG-MCG tablet    Sig: Take 1 tablet by mouth daily.    Dispense:  1 Package    Refill:  12    OK to dispense #3 packs, RF x 3.    Order Specific Question:  Supervising Provider    Answer:  Ellamae SiaOLITTLE, ROBERT P [[id number]]

## 2014-04-14 NOTE — Telephone Encounter (Signed)
The patient called to ask for a refill of her birth control.  She saw Dr. Patsy Lageropland for her last visit.  The pharmacy on file is [facility] [address], but they are not open on the weekends.  She would [facility] [address].  She also asked about a prescription transfer, if that was possible.  If she needs to come in to be seen in order to get the prescription, she would like to be informed.  She said that she started her period on time with her typical pill cycle.  Please advise.  Thank you.  CB#: [phone]

## 2014-04-30 ENCOUNTER — Ambulatory Visit (INDEPENDENT_AMBULATORY_CARE_PROVIDER_SITE_OTHER): Payer: 59 | Admitting: Family Medicine

## 2014-04-30 VITALS — BP 120/76 | HR 86 | Temp 99.2°F | Resp 18 | Ht 64.0 in | Wt 224.0 lb

## 2014-04-30 DIAGNOSIS — R1012 Left upper quadrant pain: Secondary | ICD-10-CM

## 2014-04-30 DIAGNOSIS — Z791 Long term (current) use of non-steroidal anti-inflammatories (NSAID): Secondary | ICD-10-CM

## 2014-04-30 LAB — POCT CBC
Granulocyte percent: 31.4 %G — AB (ref 37–80)
HCT, POC: 40.6 % (ref 37.7–47.9)
Hemoglobin: 12.6 g/dL (ref 12.2–16.2)
Lymph, poc: 6.1 — AB (ref 0.6–3.4)
MCH, POC: 27.2 pg (ref 27–31.2)
MCHC: 31 g/dL — AB (ref 31.8–35.4)
MCV: 87.9 fL (ref 80–97)
MID (cbc): 0.8 (ref 0–0.9)
MPV: 8.1 fL (ref 0–99.8)
POC Granulocyte: 3.1 (ref 2–6.9)
POC LYMPH PERCENT: 60.9 % — AB (ref 10–50)
POC MID %: 7.7 % (ref 0–12)
Platelet Count, POC: 316 10*3/uL (ref 142–424)
RBC: 4.61 M/uL (ref 4.04–5.48)
RDW, POC: 15.4 %
WBC: 10 10*3/uL (ref 4.6–10.2)

## 2014-04-30 NOTE — Progress Notes (Signed)
Chief Complaint:  Chief Complaint  Patient presents with  . Flank Pain    left upper abd pain x1 yr   . Abdominal Pain    HPI: Kathleen [name] is a 29 y.o. female who is here for left upper quadrant pain, feels like a balloon, seh ahd a BM 1.5 days ago, she has a history fo constipation and when she has had BM she still has left upper quadrant pain She feels it comes on more when she is hungery and it goes away with food , she deneis heart burn, had no ulcers. She had diarrhea nonbloody. She she has no fever or chills or viitng She hhad chinesse food, greays. She had wonton soup and steam dumpling She has some food allergies. NO fevers or chills.   Wt Readings from Last 3 Encounters:  04/30/14 224 lb (101.606 kg)  03/15/14 232 lb 3.2 oz (105.325 kg)  04/13/13 208 lb 7 oz (94.547 kg)     SpO2 Readings from Last 3 Encounters:  04/30/14 98%  03/15/14 99%  04/13/13 98%     Past Medical History  Diagnosis Date  . Bronchitis   . Allergy    History reviewed. No pertinent past surgical history. History   Social History  . Marital Status: Married    Spouse Name: N/A    Number of Children: N/A  . Years of Education: N/A   Social History Main Topics  . Smoking status: Never Smoker   . Smokeless tobacco: None  . Alcohol Use: 0.6 oz/week    1 Not specified per week  . Drug Use: No  . Sexual Activity: None   Other Topics Concern  . None   Social History Narrative   Family History  Problem Relation Age of Onset  . Diabetes Father   . Diabetes Maternal Grandmother    Allergies  Allergen Reactions  . Amoxicillin Other (See Comments)    Pain in back  . Corn-Containing Products Anaphylaxis and Hives  . Penicillins Other (See Comments)    Pain in back  . Peanut-Containing Drug Products Hives  . Shellfish Allergy Hives, Itching and Swelling  . Wheat Hives and Itching   Prior to Admission medications   Medication Sig Start Date End Date Taking? Authorizing  Provider  EPINEPHrine (EPIPEN) 0.3 mg/0.3 mL SOAJ injection Inject 0.3 mg into the muscle once as needed (for severe allergic reaction).   Yes Historical Provider, MD  levonorgestrel-ethinyl estradiol (NORDETTE) 0.15-30 MG-MCG tablet Take 1 tablet by mouth daily. 04/14/14  Yes Chelle S Jeffery, PA-C  albuterol (PROVENTIL HFA;VENTOLIN HFA) 108 (90 BASE) MCG/ACT inhaler Inhale 1-2 puffs into the lungs every 6 (six) hours as needed for wheezing or shortness of breath.    Historical Provider, MD  albuterol (PROVENTIL HFA;VENTOLIN HFA) 108 (90 BASE) MCG/ACT inhaler Inhale 2 puffs into the lungs every 4 (four) hours as needed for wheezing or shortness of breath. Patient not taking: Reported on 03/15/2014 04/13/13   Roxy Horsemanobert Browning, PA-C  guaiFENesin (MUCINEX) 600 MG 12 hr tablet Take 2 tablets (1,200 mg total) by mouth 2 (two) times daily. Patient not taking: Reported on 03/15/2014 03/30/13   Phill MutterPeter S Dammen, PA-C  Pseudoephedrine-APAP-DM (DAYQUIL MULTI-SYMPTOM PO) Take 30 mLs by mouth daily as needed (for cough).    Historical Provider, MD     ROS: The patient denies fevers, chills, night sweats, unintentional weight loss, chest pain, palpitations, wheezing, dyspnea on exertion, dysuria, hematuria, melena, numbness, weakness, or tingling.  All other systems have been reviewed and were otherwise negative with the exception of those mentioned in the HPI and as above.    PHYSICAL EXAM: Filed Vitals:   04/30/14 2008  BP: 120/76  Pulse: 86  Temp: 99.2 F (37.3 C)  Resp: 18   Filed Vitals:   04/30/14 2008  Height:  (1.626 m)  Weight: 224 lb (101.606 kg)   Body mass index is 38.43 kg/(m^2).  General: Alert, no acute distress HEENT:  Normocephalic, atraumatic, oropharynx patent. EOMI, PERRLA Cardiovascular:  Regular rate and rhythm, no rubs murmurs or gallops.  No Carotid bruits, radial pulse intact. No pedal edema.  Respiratory: Clear to auscultation bilaterally.  No wheezes, rales, or  rhonchi.  No cyanosis, no use of accessory musculature GI: No organomegaly, abdomen is soft and non-tender currently , positive bowel sounds.  No masses. Skin: No rashes. Neurologic: Facial musculature symmetric. Psychiatric: Patient is appropriate throughout our interaction. Lymphatic: No cervical lymphadenopathy Musculoskeletal: Gait intact.   LABS: Results for orders placed or performed in visit on 04/30/14  POCT CBC  Result Value Ref Range   WBC 10.0 4.6 - 10.2 K/uL   Lymph, poc 6.1 (A) 0.6 - 3.4   POC LYMPH PERCENT 60.9 (A) 10 - 50 %L   MID (cbc) 0.8 0 - 0.9   POC MID % 7.7 0 - 12 %M   POC Granulocyte 3.1 2 - 6.9   Granulocyte percent 31.4 (A) 37 - 80 %G   RBC 4.61 4.04 - 5.48 M/uL   Hemoglobin 12.6 12.2 - 16.2 g/dL   HCT, POC 40.9 81.1 - 47.9 %   MCV 87.9 80 - 97 fL   MCH, POC 27.2 27 - 31.2 pg   MCHC 31.0 (A) 31.8 - 35.4 g/dL   RDW, POC 91.4 %   Platelet Count, POC 316 142 - 424 K/uL   MPV 8.1 0 - 99.8 fL     EKG/XRAY:   Primary read interpreted by Dr. Conley Rolls at [facility].   ASSESSMENT/PLAN: Encounter Diagnoses  Name Primary?  . Abdominal pain, left upper quadrant Yes  . NSAID long-term use    Currenlty not very symptomatic Will get labs, if she needs referral to GI then will do that but will get [place] abd rule out any splenomegaly OTC PPI recommended for now due to use of NSAIDs, stop NSAIDs Labs pending   Gross sideeffects, risk and benefits, and alternatives of medications d/w patient. Patient is aware that all medications have potential sideeffects and we are unable to predict every sideeffect or drug-drug interaction that may occur.  LE, THAO PHUONG, DO 04/30/2014 9:15 PM   05/03/14 LM at callback number to call with lab results. Will get [place] abd  As planned

## 2014-04-30 NOTE — Patient Instructions (Signed)

## 2014-05-01 LAB — COMPLETE METABOLIC PANEL WITHOUT GFR
AST: 14 U/L (ref 0–37)
Alkaline Phosphatase: 59 U/L (ref 39–117)
CO2: 22 meq/L (ref 19–32)
Chloride: 107 meq/L (ref 96–112)
GFR, Est African American: >89 mL/min
Sodium: 139 meq/L (ref 135–145)
Total Bilirubin: 0.4 mg/dL (ref 0.2–1.2)

## 2014-05-01 LAB — COMPLETE METABOLIC PANEL WITH GFR
ALT: 16 U/L (ref 0–35)
Albumin: 3.7 g/dL (ref 3.5–5.2)
BUN: 8 mg/dL (ref 6–23)
Calcium: 8.9 mg/dL (ref 8.4–10.5)
Creat: 0.78 mg/dL (ref 0.50–1.10)
GFR, Est Non African American: >89 mL/min
Glucose, Bld: 83 mg/dL (ref 70–99)
Potassium: 4.6 mEq/L (ref 3.5–5.3)
Total Protein: 6.5 g/dL (ref 6.0–8.3)

## 2014-05-01 LAB — LIPASE: Lipase: 27 U/L (ref 0–75)

## 2014-05-29 ENCOUNTER — Encounter: Payer: Self-pay | Admitting: Family Medicine

## 2015-03-07 ENCOUNTER — Other Ambulatory Visit: Payer: Self-pay | Admitting: Physician Assistant

## 2015-04-30 ENCOUNTER — Ambulatory Visit (INDEPENDENT_AMBULATORY_CARE_PROVIDER_SITE_OTHER): Payer: 59 | Admitting: Family Medicine

## 2015-04-30 VITALS — BP 110/80 | HR 83 | Temp 98.3°F | Resp 20 | Ht 63.78 in | Wt 221.4 lb

## 2015-04-30 DIAGNOSIS — Z309 Encounter for contraceptive management, unspecified: Secondary | ICD-10-CM | POA: Diagnosis not present

## 2015-04-30 DIAGNOSIS — Z0189 Encounter for other specified special examinations: Secondary | ICD-10-CM

## 2015-04-30 DIAGNOSIS — Z0000 Encounter for general adult medical examination without abnormal findings: Secondary | ICD-10-CM | POA: Diagnosis not present

## 2015-04-30 DIAGNOSIS — Z30011 Encounter for initial prescription of contraceptive pills: Secondary | ICD-10-CM

## 2015-04-30 MED ORDER — NORGESTIM-ETH ESTRAD TRIPHASIC 0.18/0.215/0.25 MG-25 MCG PO TABS: 1.0000 | ORAL_TABLET | Freq: Every day | ORAL | Status: DC

## 2015-04-30 NOTE — Progress Notes (Signed)
Kathleen [name] is a 30 y.o. who presents today for wellness exam.  Denies tobacco or drug use.  Otherwise doing well without complaints.  PAP performed in 2015 and up to date.  No need for early screening of DM/Hyperlipidemia.  BP well controlled today   Past Medical History  Diagnosis Date  . Bronchitis   . Allergy     Social History   Social History  . Marital Status: Married    Spouse Name: N/A  . Number of Children: N/A  . Years of Education: N/A   Occupational History  . Not on file.   Social History Main Topics  . Smoking status: Never Smoker   . Smokeless tobacco: Not on file  . Alcohol Use: 0.6 oz/week    1 Standard drinks or equivalent per week  . Drug Use: No  . Sexual Activity: Not on file   Other Topics Concern  . Not on file   Social History Narrative    Family History  Problem Relation Age of Onset  . Diabetes Father   . Diabetes Maternal Grandmother     Current Outpatient Prescriptions on File Prior to Visit  Medication Sig Dispense Refill  . EPINEPHrine (EPIPEN) 0.3 mg/0.3 mL SOAJ injection Inject 0.3 mg into the muscle once as needed (for severe allergic reaction).    Marland Kitchen levonorgestrel-ethinyl estradiol (LEVORA 0.15/30, 28,) 0.15-30 MG-MCG tablet Take 1 tablet by mouth daily. THEN PATIENT NEEDS OFFICE VISIT FOR ADDITIONAL REFILLS 28 tablet 1  . albuterol (PROVENTIL HFA;VENTOLIN HFA) 108 (90 BASE) MCG/ACT inhaler Inhale 1-2 puffs into the lungs every 6 (six) hours as needed for wheezing or shortness of breath. Reported on 04/30/2015    . albuterol (PROVENTIL HFA;VENTOLIN HFA) 108 (90 BASE) MCG/ACT inhaler Inhale 2 puffs into the lungs every 4 (four) hours as needed for wheezing or shortness of breath. (Patient not taking: Reported on 03/15/2014) 1 Inhaler 3  . guaiFENesin (MUCINEX) 600 MG 12 hr tablet Take 2 tablets (1,200 mg total) by mouth 2 (two) times daily. (Patient not taking: Reported on 03/15/2014) 40 tablet 0  . Pseudoephedrine-APAP-DM (DAYQUIL  MULTI-SYMPTOM PO) Take 30 mLs by mouth daily as needed (for cough). Reported on 04/30/2015     No current facility-administered medications on file prior to visit.    Patient Information Form: Screening and ROS  AUDIT-C Score: 2 Do you feel safe in relationships? Yes.   PHQ-2:negative  Review of Symptoms  General:  Negative for nexplained weight loss, fever Skin: Negative for new or changing mole, sore that won't heal HEENT: Negative for trouble hearing, trouble seeing, ringing in ears, mouth sores, hoarseness, change in voice, dysphagia. CV:  Negative for chest pain, dyspnea, edema, palpitations Resp: Negative for cough, dyspnea, hemoptysis GI: Negative for nausea, vomiting, diarrhea, constipation, abdominal pain, melena, hematochezia. GU: Negative for dysuria, incontinence, urinary hesitance, hematuria, vaginal or penile discharge, polyuria, sexual difficulty, lumps in testicle or breasts MSK: Negative for muscle cramps or aches, joint pain or swelling Neuro: Negative for headaches, weakness, numbness, dizziness, passing out/fainting Psych: Negative for depression, anxiety, memory problems  Physical Exam Filed Vitals:   04/30/15 1756  BP: 110/80  Pulse: 83  Temp: 98.3 F (36.8 C)  Resp: 20    Gen: NAD, Well nourished, Well developed HEENT: PERLA, EOMI, [place]/AT Neck: no JVD Cardio: RRR, No murmurs/gallops/rubs Lungs: CTA, no wheezes, rhonchi, crackles Abd: NABS, soft nontender nondistended MSK: ROM normal  Neuro: CN 2-12 intact, MS 5/5 B/L UE and LE, +2 patellar and achilles  relfex b/l  Psych: AAO x 3     Chemistry      Component Value Date/Time   NA 139 04/30/2014 2040   K 4.6 04/30/2014 2040   CL 107 04/30/2014 2040   CO2 22 04/30/2014 2040   BUN 8 04/30/2014 2040   CREATININE 0.78 04/30/2014 2040      Component Value Date/Time   CALCIUM 8.9 04/30/2014 2040   ALKPHOS 59 04/30/2014 2040   AST 14 04/30/2014 2040   ALT 16 04/30/2014 2040   BILITOT 0.4  04/30/2014 2040      Lab Results  Component Value Date   WBC 10.0 04/30/2014   HGB 12.6 04/30/2014   HCT 40.6 04/30/2014   MCV 87.9 04/30/2014   PLT 418* 03/15/2014   Lab Results  Component Value Date   TSH 2.713 03/15/2014   Lab Results  Component Value Date   HGBA1C 6.2* 03/15/2014    Assessment/Plan 1) Wellness examination 2) Exam for immunization  3) Screening for STD  Denies wanting influenza vaccine and states she is up to date on TDAP.  Will obtain records form her PCP in [place].  Also states she was screened for HIV in the past year as well.

## 2015-05-28 ENCOUNTER — Other Ambulatory Visit: Payer: Self-pay | Admitting: Physician Assistant

## 2015-06-02 ENCOUNTER — Telehealth: Payer: Self-pay

## 2015-06-02 MED ORDER — LEVONORGESTREL-ETHINYL ESTRAD 0.15-30 MG-MCG PO TABS: 1.0000 | ORAL_TABLET | Freq: Every day | ORAL | Status: DC

## 2015-06-02 NOTE — Telephone Encounter (Signed)
Pt called and stated that when she was here for check up last month, she told the Dr that she would like to be put on ortho tri cyl lo because it was listed on formulary. Since then, she found that formulary has changed and she can actually get the Levora which she has been on all last year (and just ran out of day before yesterday) for $0 co-pay. She would like [place] to send in the Rx to just continue on that instead of changing to the Ortho tri. I sent in the RFs as req'd since OV notes states she was doing well on it.

## 2016-04-29 ENCOUNTER — Other Ambulatory Visit: Payer: Self-pay | Admitting: Physician Assistant

## 2016-04-29 NOTE — Telephone Encounter (Signed)
Please call and schedule an appt for the patient with her new PCP

## 2016-06-28 ENCOUNTER — Other Ambulatory Visit: Payer: Self-pay | Admitting: Physician Assistant

## 2016-06-28 NOTE — Telephone Encounter (Signed)
Pt has lost the third pack of BC Kathleen [name](Kathleen [name]) & pharmacy told her call us so it wont get denied.   Please Advise

## 2016-06-30 NOTE — Telephone Encounter (Signed)
Please let the patient know she should use back up birth control since she missed 2 days

## 2016-06-30 NOTE — Telephone Encounter (Signed)
Pt calling back about refill on Birth control stating that she has missed two days of taking the medicine

## 2016-07-01 NOTE — Telephone Encounter (Signed)
Left message reporting the comments by PA-Weber.

## 2016-09-20 ENCOUNTER — Other Ambulatory Visit: Payer: Self-pay | Admitting: Physician Assistant

## 2016-09-20 NOTE — Telephone Encounter (Signed)
Patient needs to establish with a PCP within the next 3 months before the medication that I gave her today runs out

## 2016-12-13 ENCOUNTER — Other Ambulatory Visit: Payer: Self-pay | Admitting: Physician Assistant

## 2016-12-22 ENCOUNTER — Ambulatory Visit: Payer: Managed Care, Other (non HMO) | Admitting: Family Medicine

## 2016-12-23 ENCOUNTER — Telehealth: Payer: Self-pay | Admitting: Physician Assistant

## 2016-12-23 NOTE — Telephone Encounter (Signed)
Pt no longer need BC refill.  Her new doctor is able to see her at the beginning of October before her medication runs out.  Thank you!

## 2016-12-23 NOTE — Telephone Encounter (Signed)
(  I TRIED TO ATTACH THIS MESSAGE TO THE REFILL REQUEST IN SEPT. TO SARAH BUT I COULD NOT ATTACH IT). PATIENT IS TRYING TO GET A REFILL ON HER LEVONORGESTREL-ETHINYL BIRTH CONTROL. I TOLD HER THAT IT WAS DENIED BECAUSE SHE NEEDS TO HAVE AN OFFICE VISIT. SHE EXPLAINED THAT WITH THE INSURANCE PLAN SHE HAS NOW IT IS NOT EXCEPTED HERE. SHE HAS MADE AN APPT. IN DEC. WITH DR. BETTY Swaziland WITH [facility] WHICH IS THE EARLIEST THEY COULD GET HER IN. SHE JUST NEEDS ENOUGH TO MAKE IT UNTIL HER APPT. WITH DR. Swaziland. BEST PHONE [phone] (CELL) PHARMACY CHOICE IS [facility] ON [address]. MBC

## 2017-01-04 ENCOUNTER — Encounter: Payer: Self-pay | Admitting: Family Medicine

## 2017-01-04 ENCOUNTER — Ambulatory Visit (INDEPENDENT_AMBULATORY_CARE_PROVIDER_SITE_OTHER): Payer: 59 | Admitting: Family Medicine

## 2017-01-04 VITALS — BP 118/70 | HR 80 | Resp 12 | Ht 63.78 in | Wt 233.4 lb

## 2017-01-04 DIAGNOSIS — R7303 Prediabetes: Secondary | ICD-10-CM

## 2017-01-04 DIAGNOSIS — E119 Type 2 diabetes mellitus without complications: Secondary | ICD-10-CM | POA: Insufficient documentation

## 2017-01-04 DIAGNOSIS — Z309 Encounter for contraceptive management, unspecified: Secondary | ICD-10-CM | POA: Insufficient documentation

## 2017-01-04 DIAGNOSIS — Z3041 Encounter for surveillance of contraceptive pills: Secondary | ICD-10-CM | POA: Diagnosis not present

## 2017-01-04 DIAGNOSIS — Z91018 Allergy to other foods: Secondary | ICD-10-CM | POA: Diagnosis not present

## 2017-01-04 DIAGNOSIS — E785 Hyperlipidemia, unspecified: Secondary | ICD-10-CM | POA: Insufficient documentation

## 2017-01-04 DIAGNOSIS — Z6841 Body Mass Index (BMI) 40.0 and over, adult: Secondary | ICD-10-CM

## 2017-01-04 MED ORDER — EPINEPHRINE 0.3 MG/0.3ML IJ SOAJ: 0.3000 mg | Freq: Once | INTRAMUSCULAR | 1 refills | Status: DC | PRN

## 2017-01-04 MED ORDER — NORGESTIM-ETH ESTRAD TRIPHASIC 0.18/0.215/0.25 MG-25 MCG PO TABS: 1.0000 | ORAL_TABLET | Freq: Every day | ORAL | 2 refills | Status: DC

## 2017-01-04 NOTE — Patient Instructions (Addendum)
A few things to remember from today's visit:   Morbid obesity with BMI of 40.0-44.9, adult (HCC)  Pre-diabetes  Hyperlipidemia, unspecified hyperlipidemia type What are some tips for weight loss? People become overweight for many reasons. Weight issues can run in families. They can be caused by unhealthy behaviors and a person's environment. Certain health problems and medicines can also lead to weight gain. There are some simple things you can do to reach and maintain a healthy weight:  Eat small more frequent healthy meals instead 3 bid meals. Also Weight Watchers is a good option. Avoid sweet drinks. These include regular soft drinks, fruit juices, fruit drinks, energy drinks, sweetened iced tea, and flavored milk. Avoid fast foods. Fast foods such as french fries, hamburgers, chicken nuggets, and pizza are high in calories and can cause weight gain. Eat a healthy breakfast. People who skip breakfast tend to weigh more. Don't watch more than two hours of television per day. Chew sugar-free gum between meals to cut down on snacking. Avoid grocery shopping when you're hungry. Pack a healthy lunch instead of eating out to control what and how much you eat. Eat a lot of fruits and vegetables. Aim for about 2 cups of fruit and 2 to 3 cups of vegetables per day. Aim for 150 minutes per week of moderate-intensity exercise (such as brisk walking), or 75 minutes per week of vigorous exercise (such as jogging or running). OR 15-30 min of daily brisk walking. Be more active. Small changes in physical activity can easily be added to your daily routine. For example, take the stairs instead of the elevator. Take a walk with your family. A daily walk is a great way to get exercise and to catch up on the day's events.    Please be sure medication list is accurate. If a new problem present, please set up appointment sooner than planned today.

## 2017-01-04 NOTE — Progress Notes (Signed)
HPI:   Kathleen [name] is a 31 y.o. female, who is here today to establish care.  Former PCP: Dr Paulina Fusi Last preventive routine visit: 04/2015. Last pap smear 2016.  Chronic medical problems: Allergic rhinitis and obesity.   Concerns today: Weight gain.  She is concerned about her weight and would like nutrition counseling. She has several food allergies and intolerant to some: So her dietary choices are limited.   She does not followed a healthy diet and does not exercising regularly. Hx of HLD and "borderline" diabetes.  Lab Results  Component Value Date   CHOL 218 (H) 03/15/2014   HDL 57 03/15/2014   LDLCALC 144 (H) 03/15/2014   TRIG 83 03/15/2014   CHOLHDL 3.8 03/15/2014   Lab Results  Component Value Date   HGBA1C 6.2 (H) 03/15/2014   She is also requesting refill on her OCP. She mentions that she's not having menses unless she forgets taking OCP for a couple days. She was on Depo-Provera in the past but discontinue because it caused yeast infections.  She is married and planning on starting a family within the next 3-5 years. She denies Hx of irregular menses before she started birth control.   Review of Systems  Constitutional: Negative for activity change, appetite change, fatigue and fever.  HENT: Negative for mouth sores, nosebleeds and trouble swallowing.   Eyes: Negative for redness and visual disturbance.  Respiratory: Negative for cough, shortness of breath and wheezing.   Cardiovascular: Negative for chest pain, palpitations and leg swelling.  Gastrointestinal: Negative for abdominal pain, nausea and vomiting.       Negative for changes in bowel habits.  Endocrine: Negative for cold intolerance, heat intolerance, polydipsia, polyphagia and polyuria.  Genitourinary: Negative for decreased urine volume, dysuria and hematuria.  Musculoskeletal: Negative for gait problem and myalgias.  Skin: Negative for pallor and rash.    Allergic/Immunologic: Positive for environmental allergies.  Neurological: Negative for syncope, weakness and headaches.  Psychiatric/Behavioral: Negative for confusion. The patient is not nervous/anxious.       Current Outpatient Prescriptions on File Prior to Visit  Medication Sig Dispense Refill  . PORTIA-28 0.15-30 MG-MCG tablet TAKE 1 TABLET BY MOUTH DAILY 84 tablet 0  . Pseudoephedrine-APAP-DM (DAYQUIL MULTI-SYMPTOM PO) Take 30 mLs by mouth daily as needed (for cough). Reported on 04/30/2015     No current facility-administered medications on file prior to visit.      Past Medical History:  Diagnosis Date  . Allergy   . Bronchitis    Allergies  Allergen Reactions  . Amoxicillin Other (See Comments)    Pain in back  . Corn-Containing Products Anaphylaxis and Hives  . Penicillins Other (See Comments)    Pain in back  . Fruit & Vegetable Daily [Nutritional Supplements]     Allergy to fruits  . Peanut-Containing Drug Products Hives  . Shellfish Allergy Hives, Itching and Swelling  . Wheat Hives and Itching    Family History  Problem Relation Age of Onset  . Diabetes Father   . Diabetes Maternal Grandmother   . Cancer Neg Hx   . Hypertension Neg Hx   . Stroke Neg Hx     Social History   Social History  . Marital status: Married    Spouse name: N/A  . Number of children: N/A  . Years of education: N/A   Social History Main Topics  . Smoking status: Never Smoker  . Smokeless tobacco: Never Used  .  Alcohol use 0.6 oz/week    1 Standard drinks or equivalent per week  . Drug use: No  . Sexual activity: Yes    Partners: Male   Other Topics Concern  . None   Social History Narrative  . None    Vitals:   01/04/17 0732  BP: 118/70  Pulse: 80  Resp: 12   Body mass index is 40.34 kg/m.   Physical Exam  Nursing note and vitals reviewed. Constitutional: She is oriented to person, place, and time. She appears well-developed. No distress.  HENT:   Head: Normocephalic and atraumatic.  Mouth/Throat: Oropharynx is clear and moist and mucous membranes are normal.  Eyes: Pupils are equal, round, and reactive to light. Conjunctivae are normal.  Neck: No tracheal deviation present. No thyroid mass and no thyromegaly present.  Cardiovascular: Normal rate and regular rhythm.   No murmur heard. Pulses:      Dorsalis pedis pulses are 2+ on the right side, and 2+ on the left side.  Respiratory: Effort normal and breath sounds normal. No respiratory distress.  GI: Soft. She exhibits no mass. There is no hepatomegaly. There is no tenderness.  Musculoskeletal: She exhibits no edema.  Lymphadenopathy:    She has no cervical adenopathy.  Neurological: She is alert and oriented to person, place, and time. She has normal strength. Coordination and gait normal.  Skin: Skin is warm. No erythema.  Psychiatric: She has a normal mood and affect.  Well groomed, good eye contact.     ASSESSMENT AND PLAN:   Ms. Kathleen [name] was seen today for establish care.  Diagnoses and all orders for this visit:  Morbid obesity with BMI of 40.0-44.9, adult (HCC)  We discussed benefits of wt loss as well as adverse effects of obesity. Consistency with healthy diet and physical activity recommended. Calorie count, portion control, and reviewing labels recommended. Weight Watchers is a good option as well as daily brisk walking for 15-30 min as tolerated.  Pre-diabetes  Healthy lifestyle for primary prevention discussed. ? PCOS.  -     Basic metabolic panel; Future -     Hemoglobin A1c; Future  Hyperlipidemia, unspecified hyperlipidemia type  Low fat diet recommended for now. She will come for fasting labs and further recommendations will be given accordingly.  -     Lipid panel; Future  Encounter for surveillance of contraceptive pills  We discussed birth control options and side effects. Mirena,explanon could be a good option if she wants to hold on  pregnancy for 3-5 years. She is not interested in neither one. Continue current OCP.  -     Norgestimate-Ethinyl Estradiol Triphasic (ORTHO TRI-CYCLEN LO) 0.18/0.215/0.25 MG-25 MCG tab; Take 1 tablet by mouth daily.  Multiple food allergies -     EPINEPHrine (EPIPEN) 0.3 mg/0.3 mL IJ SOAJ injection; Inject 0.3 mLs (0.3 mg total) into the muscle once as needed (for severe allergic reaction).      Prescott Truex G. Swaziland, MD  [facility]. [place] office.

## 2017-01-06 ENCOUNTER — Encounter: Payer: Self-pay | Admitting: Family Medicine

## 2017-01-10 ENCOUNTER — Telehealth: Payer: Self-pay | Admitting: General Practice

## 2017-01-10 MED ORDER — LEVONORGESTREL-ETHINYL ESTRAD 0.15-30 MG-MCG PO TABS: 1.0000 | ORAL_TABLET | Freq: Every day | ORAL | 2 refills | Status: DC

## 2017-01-10 NOTE — Telephone Encounter (Signed)
Pt calling back stated that the correct name of the medication that she use to take is Levora-20 tab 0.15/30 so she needs this one called in.   [facility] and [place]

## 2017-01-10 NOTE — Telephone Encounter (Signed)
Pt states she would like a rx that is free to her for birth control. Pt states she has used PORTIA-28 0.15-30 MG-MCG tablet   Dr Swaziland prescribed: Norgestimate-Ethinyl Estradiol Triphasic (ORTHO TRI-CYCLEN LO) 0.18/0.215/0.25 MG-25 MCG tab  I advised pt note to pharmacy said OK to use formulary alternative.  Pt is going back to the pharmacy and hopes we can contact pharmacy to clarify, because this tx was $18  [id number] - [place], [place] - [address] AT [facility] OF [address]

## 2017-01-10 NOTE — Telephone Encounter (Signed)
I sent in the Rx below that is covered by patient's insurance. I d/c'd the one that was $18.   Just an FYI!  Thanks!

## 2017-01-12 ENCOUNTER — Other Ambulatory Visit (INDEPENDENT_AMBULATORY_CARE_PROVIDER_SITE_OTHER): Payer: 59

## 2017-01-12 DIAGNOSIS — E785 Hyperlipidemia, unspecified: Secondary | ICD-10-CM

## 2017-01-12 DIAGNOSIS — R7303 Prediabetes: Secondary | ICD-10-CM | POA: Diagnosis not present

## 2017-01-12 LAB — BASIC METABOLIC PANEL
BUN: 12 mg/dL (ref 6–23)
CO2: 24 mEq/L (ref 19–32)
Calcium: 8.9 mg/dL (ref 8.4–10.5)
Chloride: 106 mEq/L (ref 96–112)
Creatinine, Ser: 0.88 mg/dL (ref 0.40–1.20)
GFR: 97.27 mL/min (ref >60.00)
Glucose, Bld: 97 mg/dL (ref 70–99)
POTASSIUM: 4.4 meq/L (ref 3.5–5.1)
Sodium: 138 mEq/L (ref 135–145)

## 2017-01-12 LAB — LIPID PANEL
Cholesterol: 182 mg/dL (ref 0–200)
HDL: 41.8 mg/dL (ref >39.00)
LDL Cholesterol: 128 mg/dL — ABNORMAL HIGH (ref 0–99)
NonHDL: 140.61
Total CHOL/HDL Ratio: 4
Triglycerides: 65 mg/dL (ref 0.0–149.0)
VLDL: 13 mg/dL (ref 0.0–40.0)

## 2017-01-12 LAB — HEMOGLOBIN A1C: HEMOGLOBIN A1C: 6.5 % (ref 4.6–6.5)

## 2017-01-14 ENCOUNTER — Ambulatory Visit: Payer: Managed Care, Other (non HMO) | Admitting: Family Medicine

## 2017-01-26 ENCOUNTER — Other Ambulatory Visit: Payer: Self-pay

## 2017-01-26 DIAGNOSIS — R7303 Prediabetes: Secondary | ICD-10-CM

## 2017-08-14 ENCOUNTER — Telehealth: Payer: Self-pay | Admitting: Family Medicine

## 2017-09-02 ENCOUNTER — Other Ambulatory Visit: Payer: Self-pay | Admitting: Family Medicine

## 2017-09-02 MED ORDER — LEVONORGESTREL-ETHINYL ESTRAD 0.15-30 MG-MCG PO TABS: 1.0000 | ORAL_TABLET | Freq: Every day | ORAL | 1 refills | Status: DC

## 2017-09-02 NOTE — Telephone Encounter (Signed)
Message sent to Dr. Jordan for review and approval. 

## 2017-09-02 NOTE — Telephone Encounter (Signed)
Prescription for Kathleen [name] was sent to her pharmacy. Patient advised patient to arrange follow-up appointment for October/2019,annual follow up.  Thanks, BJ

## 2017-09-02 NOTE — Telephone Encounter (Signed)
Pt calling to request this refill for levonorgestrel-ethinyl estradiol (LEVORA 0.15/30, 28,) 0.15-30 MG-MCG tablet  [id number] - [place], [place] - [address] AT [facility] OF [address] [phone] (Phone) [phone] (Fax)    Pt states she cannot go without her bc right now. Pt states she has discussed this med with Dr Swaziland

## 2017-09-02 NOTE — Telephone Encounter (Signed)
Patient informed that Rx was sent to pharmacy and reminded to schedule appointment in October 2019.

## 2017-11-02 ENCOUNTER — Ambulatory Visit: Payer: 59 | Admitting: Family Medicine

## 2017-11-09 ENCOUNTER — Ambulatory Visit: Payer: 59 | Admitting: Family Medicine

## 2018-02-03 ENCOUNTER — Other Ambulatory Visit: Payer: Self-pay | Admitting: Family Medicine

## 2018-02-22 ENCOUNTER — Other Ambulatory Visit: Payer: Self-pay | Admitting: Family Medicine

## 2018-02-22 NOTE — Telephone Encounter (Addendum)
Copied from CRM [id number]. Topic: General - Other >> Feb 22, 2018 11:10 AM Leafy Roobinson, Norma J wrote: Reason for CRM: pt is [facility] told her dr Swazilandjordan is unable to receive faxes. Pt needs refill on  levora bcp . [facility]. Pt has a physical schedule for 03-08-18

## 2018-02-23 MED ORDER — LEVONORGESTREL-ETHINYL ESTRAD 0.15-30 MG-MCG PO TABS: 1.0000 | ORAL_TABLET | Freq: Every day | ORAL | 0 refills | Status: DC

## 2018-02-23 NOTE — Telephone Encounter (Signed)
Courtesy refill. Appt with Dr. SwazilandJordan 03/08/18

## 2018-03-08 ENCOUNTER — Encounter: Payer: 59 | Admitting: Family Medicine

## 2018-08-11 ENCOUNTER — Inpatient Hospital Stay (HOSPITAL_COMMUNITY)
Admission: EM | Admit: 2018-08-11 | Discharge: 2018-08-15 | DRG: 638 | Disposition: A | Discharge status: Home / Self Care | Payer: No Typology Code available for payment source | Attending: Family Medicine | Admitting: Family Medicine

## 2018-08-11 ENCOUNTER — Inpatient Hospital Stay (HOSPITAL_COMMUNITY): Payer: No Typology Code available for payment source

## 2018-08-11 ENCOUNTER — Emergency Department (HOSPITAL_COMMUNITY): Payer: No Typology Code available for payment source

## 2018-08-11 ENCOUNTER — Other Ambulatory Visit: Payer: Self-pay

## 2018-08-11 DIAGNOSIS — K219 Gastro-esophageal reflux disease without esophagitis: Secondary | ICD-10-CM | POA: Diagnosis present

## 2018-08-11 DIAGNOSIS — E1310 Other specified diabetes mellitus with ketoacidosis without coma: Secondary | ICD-10-CM | POA: Diagnosis present

## 2018-08-11 DIAGNOSIS — E876 Hypokalemia: Secondary | ICD-10-CM | POA: Diagnosis present

## 2018-08-11 DIAGNOSIS — Z880 Allergy status to penicillin: Secondary | ICD-10-CM | POA: Diagnosis not present

## 2018-08-11 DIAGNOSIS — E1010 Type 1 diabetes mellitus with ketoacidosis without coma: Secondary | ICD-10-CM

## 2018-08-11 DIAGNOSIS — Z79899 Other long term (current) drug therapy: Secondary | ICD-10-CM

## 2018-08-11 DIAGNOSIS — Z91010 Allergy to peanuts: Secondary | ICD-10-CM

## 2018-08-11 DIAGNOSIS — Z91013 Allergy to seafood: Secondary | ICD-10-CM | POA: Diagnosis not present

## 2018-08-11 DIAGNOSIS — Z888 Allergy status to other drugs, medicaments and biological substances status: Secondary | ICD-10-CM

## 2018-08-11 DIAGNOSIS — Z91018 Allergy to other foods: Secondary | ICD-10-CM | POA: Diagnosis not present

## 2018-08-11 DIAGNOSIS — E669 Obesity, unspecified: Secondary | ICD-10-CM | POA: Diagnosis present

## 2018-08-11 DIAGNOSIS — Z6833 Body mass index (BMI) 33.0-33.9, adult: Secondary | ICD-10-CM | POA: Diagnosis not present

## 2018-08-11 DIAGNOSIS — N179 Acute kidney failure, unspecified: Secondary | ICD-10-CM | POA: Diagnosis present

## 2018-08-11 DIAGNOSIS — Z20828 Contact with and (suspected) exposure to other viral communicable diseases: Secondary | ICD-10-CM | POA: Diagnosis present

## 2018-08-11 DIAGNOSIS — E785 Hyperlipidemia, unspecified: Secondary | ICD-10-CM | POA: Diagnosis present

## 2018-08-11 DIAGNOSIS — Z833 Family history of diabetes mellitus: Secondary | ICD-10-CM

## 2018-08-11 DIAGNOSIS — E1110 Type 2 diabetes mellitus with ketoacidosis without coma: Secondary | ICD-10-CM | POA: Diagnosis not present

## 2018-08-11 DIAGNOSIS — E119 Type 2 diabetes mellitus without complications: Secondary | ICD-10-CM | POA: Diagnosis present

## 2018-08-11 DIAGNOSIS — E875 Hyperkalemia: Secondary | ICD-10-CM | POA: Diagnosis present

## 2018-08-11 DIAGNOSIS — J982 Interstitial emphysema: Secondary | ICD-10-CM | POA: Diagnosis present

## 2018-08-11 DIAGNOSIS — R0602 Shortness of breath: Secondary | ICD-10-CM

## 2018-08-11 LAB — BLOOD GAS, ARTERIAL
Acid-base deficit: 18.4 mmol/L — ABNORMAL HIGH (ref 0.0–2.0)
Bicarbonate: 8.2 mmol/L — ABNORMAL LOW (ref 20.0–28.0)
Drawn by: 270211
Drawn by: 270211
O2 Content: 2 L/min
O2 Saturation: 97.2 %
O2 Saturation: 98.4 %
Patient temperature: 98.6
Patient temperature: 37
pCO2 arterial: 21.4 mmHg — ABNORMAL LOW (ref 32.0–48.0)
pH, Arterial: 7.014 — CL (ref 7.350–7.450)
pH, Arterial: 7.207 — ABNORMAL LOW (ref 7.350–7.450)
pO2, Arterial: 109 mmHg — ABNORMAL HIGH (ref 83.0–108.0)
pO2, Arterial: 107 mmHg (ref 83.0–108.0)

## 2018-08-11 LAB — BASIC METABOLIC PANEL
Anion gap: 18 — ABNORMAL HIGH (ref 5–15)
Anion gap: 21 — ABNORMAL HIGH (ref 5–15)
BUN: 14 mg/dL (ref 6–20)
BUN: 13 mg/dL (ref 6–20)
BUN: 12 mg/dL (ref 6–20)
CO2: <7 mmol/L — ABNORMAL LOW (ref 22–32)
CO2: 7 mmol/L — ABNORMAL LOW (ref 22–32)
CO2: 11 mmol/L — ABNORMAL LOW (ref 22–32)
Calcium: 8 mg/dL — ABNORMAL LOW (ref 8.9–10.3)
Calcium: 8 mg/dL — ABNORMAL LOW (ref 8.9–10.3)
Calcium: 8.1 mg/dL — ABNORMAL LOW (ref 8.9–10.3)
Chloride: 108 mmol/L (ref 98–111)
Chloride: 118 mmol/L — ABNORMAL HIGH (ref 98–111)
Chloride: 114 mmol/L — ABNORMAL HIGH (ref 98–111)
Creatinine, Ser: 1.98 mg/dL — ABNORMAL HIGH (ref 0.44–1.00)
Creatinine, Ser: 1.45 mg/dL — ABNORMAL HIGH (ref 0.44–1.00)
Creatinine, Ser: 1.22 mg/dL — ABNORMAL HIGH (ref 0.44–1.00)
GFR calc Af Amer: 38 mL/min — ABNORMAL LOW (ref >60)
GFR calc Af Amer: 55 mL/min — ABNORMAL LOW (ref >60)
GFR calc Af Amer: >60 mL/min (ref >60)
GFR calc non Af Amer: 33 mL/min — ABNORMAL LOW (ref >60)
GFR calc non Af Amer: 48 mL/min — ABNORMAL LOW (ref >60)
GFR calc non Af Amer: 59 mL/min — ABNORMAL LOW (ref >60)
Glucose, Bld: 694 mg/dL (ref 70–99)
Glucose, Bld: 241 mg/dL — ABNORMAL HIGH (ref 70–99)
Glucose, Bld: 189 mg/dL — ABNORMAL HIGH (ref 70–99)
Potassium: 5.4 mmol/L — ABNORMAL HIGH (ref 3.5–5.1)
Potassium: 4 mmol/L (ref 3.5–5.1)
Potassium: 3.7 mmol/L (ref 3.5–5.1)
Sodium: 136 mmol/L (ref 135–145)
Sodium: 143 mmol/L (ref 135–145)
Sodium: 146 mmol/L — ABNORMAL HIGH (ref 135–145)

## 2018-08-11 LAB — ABO/RH: ABO/RH(D): B POS

## 2018-08-11 LAB — GLUCOSE, CAPILLARY
Glucose-Capillary: 205 mg/dL — ABNORMAL HIGH (ref 70–99)
Glucose-Capillary: 202 mg/dL — ABNORMAL HIGH (ref 70–99)
Glucose-Capillary: 192 mg/dL — ABNORMAL HIGH (ref 70–99)
Glucose-Capillary: 173 mg/dL — ABNORMAL HIGH (ref 70–99)
Glucose-Capillary: 173 mg/dL — ABNORMAL HIGH (ref 70–99)
Glucose-Capillary: 190 mg/dL — ABNORMAL HIGH (ref 70–99)

## 2018-08-11 LAB — URINALYSIS, ROUTINE W REFLEX MICROSCOPIC
Bilirubin Urine: NEGATIVE
Glucose, UA: >=500 mg/dL — AB
Ketones, ur: 80 mg/dL — AB
Leukocytes,Ua: NEGATIVE
Nitrite: NEGATIVE
Protein, ur: 100 mg/dL — AB
Specific Gravity, Urine: 1.024 (ref 1.005–1.030)
pH: 5 (ref 5.0–8.0)

## 2018-08-11 LAB — CBG MONITORING, ED
Glucose-Capillary: >600 mg/dL (ref 70–99)
Glucose-Capillary: >600 mg/dL (ref 70–99)
Glucose-Capillary: >600 mg/dL (ref 70–99)
Glucose-Capillary: 463 mg/dL — ABNORMAL HIGH (ref 70–99)
Glucose-Capillary: 498 mg/dL — ABNORMAL HIGH (ref 70–99)
Glucose-Capillary: 297 mg/dL — ABNORMAL HIGH (ref 70–99)

## 2018-08-11 LAB — MRSA PCR SCREENING: MRSA by PCR: NEGATIVE

## 2018-08-11 LAB — BLOOD GAS, VENOUS
Acid-base deficit: 28.5 mmol/L — ABNORMAL HIGH (ref 0.0–2.0)
Bicarbonate: 5 mmol/L — ABNORMAL LOW (ref 20.0–28.0)
O2 Saturation: 56 %
Patient temperature: 98.6
pCO2, Ven: 28 mmHg — ABNORMAL LOW (ref 44.0–60.0)
pH, Ven: 6.884 — CL (ref 7.250–7.430)
pO2, Ven: 34.5 mmHg (ref 32.0–45.0)

## 2018-08-11 LAB — I-STAT BETA HCG BLOOD, ED (MC, WL, AP ONLY): I-stat hCG, quantitative: <5 m[IU]/mL (ref <5)

## 2018-08-11 LAB — CBC
HCT: 47 % — ABNORMAL HIGH (ref 36.0–46.0)
Hemoglobin: 14.3 g/dL (ref 12.0–15.0)
MCH: 27.4 pg (ref 26.0–34.0)
MCHC: 30.4 g/dL (ref 30.0–36.0)
MCV: 90.2 fL (ref 80.0–100.0)
Platelets: 291 10*3/uL (ref 150–400)
RBC: 5.21 MIL/uL — ABNORMAL HIGH (ref 3.87–5.11)
RDW: 13.6 % (ref 11.5–15.5)
WBC: 25.8 10*3/uL — ABNORMAL HIGH (ref 4.0–10.5)
nRBC: 0 % (ref 0.0–0.2)

## 2018-08-11 LAB — MAGNESIUM
Magnesium: 2 mg/dL (ref 1.7–2.4)
Magnesium: 1.9 mg/dL (ref 1.7–2.4)

## 2018-08-11 LAB — SARS CORONAVIRUS 2 BY RT PCR (HOSPITAL ORDER, PERFORMED IN ~~LOC~~ HOSPITAL LAB): SARS Coronavirus 2: NEGATIVE

## 2018-08-11 LAB — TSH: TSH: 1.33 u[IU]/mL (ref 0.350–4.500)

## 2018-08-11 MED ORDER — STERILE WATER FOR INJECTION IV SOLN: Freq: Once | INTRAVENOUS | Status: AC

## 2018-08-11 MED ORDER — INSULIN REGULAR(HUMAN) IN NACL 100-0.9 UT/100ML-% IV SOLN
INTRAVENOUS | Status: DC
  Filled 2018-08-11: qty 100

## 2018-08-11 MED ORDER — SODIUM CHLORIDE 0.9 % IV BOLUS
1000.0000 mL | Freq: Once | INTRAVENOUS | Status: AC
  Administered 2018-08-11: 1000 mL via INTRAVENOUS

## 2018-08-11 MED ORDER — SODIUM BICARBONATE 8.4 % IV SOLN
INTRAVENOUS | Status: AC
  Administered 2018-08-11: 50 meq via INTRAVENOUS
  Filled 2018-08-11: qty 50

## 2018-08-11 MED ORDER — SODIUM CHLORIDE 0.9 % IV SOLN
INTRAVENOUS | Status: DC
  Administered 2018-08-11: 17:00:00 via INTRAVENOUS

## 2018-08-11 MED ORDER — INSULIN GLARGINE 100 UNIT/ML ~~LOC~~ SOLN: 10.0000 [IU] | Freq: Every day | SUBCUTANEOUS | Status: DC

## 2018-08-11 MED ORDER — SODIUM BICARBONATE 8.4 % IV SOLN
50.0000 meq | Freq: Once | INTRAVENOUS | Status: AC
  Administered 2018-08-11: 18:00:00 50 meq via INTRAVENOUS

## 2018-08-11 MED ORDER — LIVING WELL WITH DIABETES BOOK
Freq: Once | Status: AC
  Administered 2018-08-11: 16:00:00
  Filled 2018-08-11: qty 1

## 2018-08-11 MED ORDER — INSULIN GLARGINE 100 UNIT/ML ~~LOC~~ SOLN
10.0000 [IU] | Freq: Once | SUBCUTANEOUS | Status: DC
  Filled 2018-08-11: qty 0.1

## 2018-08-11 MED ORDER — SODIUM CHLORIDE 0.9% FLUSH
3.0000 mL | Freq: Two times a day (BID) | INTRAVENOUS | Status: DC
  Administered 2018-08-11 (×2): 3 mL via INTRAVENOUS

## 2018-08-11 MED ORDER — STERILE WATER FOR INJECTION IV SOLN
Freq: Once | INTRAVENOUS | Status: AC
  Administered 2018-08-11: 13:00:00 via INTRAVENOUS
  Filled 2018-08-11: qty 850

## 2018-08-11 MED ORDER — SODIUM BICARBONATE 8.4 % IV SOLN
50.0000 meq | Freq: Once | INTRAVENOUS | Status: AC
  Administered 2018-08-11: 50 meq via INTRAVENOUS

## 2018-08-11 MED ORDER — DEXTROSE 50 % IV SOLN: 25.0000 mL | INTRAVENOUS | Status: DC | PRN

## 2018-08-11 MED ORDER — DEXTROSE-NACL 5-0.45 % IV SOLN
INTRAVENOUS | Status: DC
  Administered 2018-08-11: 18:00:00 via INTRAVENOUS

## 2018-08-11 MED ORDER — INSULIN REGULAR BOLUS VIA INFUSION
0.0000 [IU] | Freq: Three times a day (TID) | INTRAVENOUS | Status: DC
  Administered 2018-08-12: 5.6 [IU]/h via INTRAVENOUS
  Filled 2018-08-11: qty 10

## 2018-08-11 MED ORDER — INSULIN REGULAR(HUMAN) IN NACL 100-0.9 UT/100ML-% IV SOLN
INTRAVENOUS | Status: DC
  Administered 2018-08-11: 5.4 [IU]/h via INTRAVENOUS
  Filled 2018-08-11: qty 100

## 2018-08-11 MED ORDER — DEXTROSE-NACL 5-0.45 % IV SOLN
INTRAVENOUS | Status: DC
  Administered 2018-08-11: 12:00:00 via INTRAVENOUS

## 2018-08-11 MED ORDER — ENOXAPARIN SODIUM 40 MG/0.4ML ~~LOC~~ SOLN
40.0000 mg | SUBCUTANEOUS | Status: DC
  Administered 2018-08-11 – 2018-08-14 (×4): 40 mg via SUBCUTANEOUS
  Filled 2018-08-11 (×4): qty 0.4

## 2018-08-11 MED ORDER — SODIUM CHLORIDE 0.9 % IV SOLN
INTRAVENOUS | Status: DC
  Administered 2018-08-11: 13:00:00 via INTRAVENOUS

## 2018-08-11 NOTE — Progress Notes (Signed)
Inpatient Diabetes Program Recommendations  AACE/ADA: New Consensus Statement on Inpatient Glycemic Control (2015)  Target Ranges:  Prepandial:   less than 140 mg/dL      Peak postprandial:   less than 180 mg/dL (1-2 hours)      Critically ill patients:  140 - 180 mg/dL   Lab Results  Component Value Date   GLUCAP 463 (H) 08/11/2018   HGBA1C 6.5 01/12/2017    Review of Glycemic Control  Diabetes history: "Pre-diabetes" Outpatient Diabetes medications: None Current orders for Inpatient glycemic control: IV insulin per ED GlucoStabilizer order set  HgbA1C pending. Pt has recently been on a liquid diet to lose weight. BMI - 40 CO2 < 7    AG not calculated Will need to be on DKA order set  Inpatient Diabetes Program Recommendations:     IV insulin per DKA order set (includes BMET Q4H) Will order Living Well with Diabetes book Order OP Diabetes Education consult for new onset DM Will need close f/u with PCP within 1-2 weeks of discharge.  Will attempt to speak with pt via phone regarding new diagnosis of DM when pt admitted.  Continue to follow.  Thank you. Ailene Ards, RD, LDN, CDE Inpatient Diabetes Coordinator [phone]

## 2018-08-11 NOTE — ED Triage Notes (Signed)
Pt BIBA from home c/o polyuria and polydipsia x3 days.  Pt reports fatigue for same amount of time. Pt states that shes been on a liquid diet x4 weeks in an effort to lose weight.    Normally ambulatory, unable to at this time. AOx4.

## 2018-08-11 NOTE — ED Provider Notes (Addendum)
[facility]-EMERGENCY DEPT Provider Note   CSN: [id number] Arrival date & time: 08/11/18  0955    History   Chief Complaint Chief Complaint  Patient presents with  . Hyperglycemia    HPI Kathleen [name] is a 33 y.o. female.     HPI   She presents for evaluation of shortness of breath, and "trouble breathing," since this morning.  She is also had increased urinary frequency, and drinking more fluids than usual.  She had a couple episodes of vomiting today.  She denies cough, diarrhea, documented fever, back pain, focal weakness or paresthesia.  Today she has felt cold but not taken her temperature.  She does not have any known sick contacts.  She states that she has been trying to diet using a liquid diet for about a month.  She has never been diagnosed with diabetes.  She has no known sick contacts.  No recent travel.  There are no other known modifying factors.  Past Medical History:  Diagnosis Date  . Allergy   . Bronchitis     Patient Active Problem List   Diagnosis Date Noted  . Pre-diabetes 01/04/2017  . Hyperlipidemia 01/04/2017  . Contraception management 01/04/2017  . Multiple food allergies 01/04/2017  . Morbid obesity with BMI of 40.0-44.9, adult (HCC) 03/15/2014    No past surgical history on file.   OB History   No obstetric history on file.      Home Medications    Prior to Admission medications   Medication Sig Start Date End Date Taking? Authorizing Provider  EPINEPHrine (EPIPEN) 0.3 mg/0.3 mL IJ SOAJ injection Inject 0.3 mLs (0.3 mg total) into the muscle once as needed (for severe allergic reaction). 01/04/17   [place], Betty G, MD  levonorgestrel-ethinyl estradiol (LEVORA 0.15/30, 28,) 0.15-30 MG-MCG tablet Take 1 tablet by mouth daily. 02/23/18   [place], Betty G, MD  Pseudoephedrine-APAP-DM (DAYQUIL MULTI-SYMPTOM PO) Take 30 mLs by mouth daily as needed (for cough). Reported on 04/30/2015    [provider]     Family History Family History  Problem Relation Age of Onset  . Diabetes Father   . Diabetes Maternal Grandmother   . Cancer Neg Hx   . Hypertension Neg Hx   . Stroke Neg Hx     Social History Social History   Tobacco Use  . Smoking status: Never Smoker  . Smokeless tobacco: Never Used  Substance Use Topics  . Alcohol use: Yes    Alcohol/week: 1.0 standard drinks    Types: 1 Standard drinks or equivalent per week  . Drug use: No     Allergies   Amoxicillin; Corn-containing products; Penicillins; Fruit & vegetable daily [nutritional supplements]; Peanut-containing drug products; Shellfish allergy; and Wheat   Review of Systems Review of Systems  All other systems reviewed and are negative.    Physical Exam Updated Vital Signs BP (!) 147/127   Pulse (!) 129   Temp 98.6 F (37 C) (Rectal)   Resp 16   Ht  (1.626 m)   Wt 106.6 kg   SpO2 100%   BMI 40.34 kg/m   Physical Exam Vitals signs and nursing note reviewed.  Constitutional:      General: She is in acute distress.     Appearance: She is well-developed. She is obese. She is ill-appearing and toxic-appearing. She is not diaphoretic.  HENT:     Head: Normocephalic and atraumatic.     Right Ear: External ear normal.  Left Ear: External ear normal.  Eyes:     Conjunctiva/sclera: Conjunctivae normal.     Pupils: Pupils are equal, round, and reactive to light.  Neck:     Musculoskeletal: Normal range of motion and neck supple.     Trachea: Phonation normal.  Cardiovascular:     Rate and Rhythm: Regular rhythm. Tachycardia present.     Heart sounds: Normal heart sounds.  Pulmonary:     Effort: Respiratory distress present.     Breath sounds: Normal breath sounds.     Comments: Tachypneic Abdominal:     Palpations: Abdomen is soft.     Tenderness: There is no abdominal tenderness.  Musculoskeletal: Normal range of motion.     Right lower leg: No edema.     Left lower leg: No edema.  Skin:     General: Skin is warm and dry.     Coloration: Skin is not jaundiced or pale.  Neurological:     Mental Status: She is alert and oriented to person, place, and time.     Cranial Nerves: No cranial nerve deficit.     Sensory: No sensory deficit.     Motor: No abnormal muscle tone.     Coordination: Coordination normal.  Psychiatric:        Behavior: Behavior normal.        Thought Content: Thought content normal.        Judgment: Judgment normal.      ED Treatments / Results  Labs (all labs ordered are listed, but only abnormal results are displayed) Labs Reviewed  BASIC METABOLIC PANEL - Abnormal; Notable for the following components:      Result Value   Potassium 5.4 (*)    CO2 <7 (*)    Glucose, Bld 694 (*)    Creatinine, Ser 1.98 (*)    Calcium 8.0 (*)    GFR calc non Af Amer 33 (*)    GFR calc Af Amer 38 (*)    All other components within normal limits  CBC - Abnormal; Notable for the following components:   WBC 25.8 (*)    RBC 5.21 (*)    HCT 47.0 (*)    All other components within normal limits  BLOOD GAS, VENOUS - Abnormal; Notable for the following components:   pH, Ven 6.884 (*)    pCO2, Ven 28.0 (*)    Bicarbonate 5.0 (*)    Acid-base deficit 28.5 (*)    All other components within normal limits  CBG MONITORING, ED - Abnormal; Notable for the following components:   Glucose-Capillary >600 (*)    All other components within normal limits  CBG MONITORING, ED - Abnormal; Notable for the following components:   Glucose-Capillary >600 (*)    All other components within normal limits  CBG MONITORING, ED - Abnormal; Notable for the following components:   Glucose-Capillary >600 (*)    All other components within normal limits  SARS CORONAVIRUS 2 (HOSPITAL ORDER, PERFORMED IN [facility])  URINALYSIS, ROUTINE W REFLEX MICROSCOPIC  HIV ANTIBODY (ROUTINE TESTING W REFLEX)  BASIC METABOLIC PANEL  I-STAT BETA HCG BLOOD, ED (MC, [facility], AP ONLY)  I-STAT  BETA HCG BLOOD, ED ([facility], [facility], [facility] ONLY)  ABO/RH    EKG None  Radiology Dg Chest Port 1 View  Result Date: 08/11/2018 CLINICAL DATA:  Shortness of breath EXAM: PORTABLE CHEST 1 VIEW COMPARISON:  04/13/2013 FINDINGS: Pneumomediastinum seen along the left heart border and the descending aorta. Small volume  gas is seen in the neck. There is no history of vomiting and source is uncertain, presumably barotrauma. No evidence of pneumothorax. The lungs appear clear and normal volume. Normal heart size. IMPRESSION: 1. Pneumomediastinum. 2. Clear lungs. Electronically Signed   By: Marnee Spring M.D.   On: 08/11/2018 11:52    Procedures .Critical Care Performed by: Mancel Bale, MD Authorized by: Mancel Bale, MD   Critical care provider statement:    Critical care time (minutes):  35   Critical care start time:  08/11/2018 10:25 AM   Critical care end time:  08/11/2018 1:51 PM   Critical care time was exclusive of:  Separately billable procedures and treating other patients   Critical care was necessary to treat or prevent imminent or life-threatening deterioration of the following conditions:  Metabolic crisis   Critical care was time spent personally by me on the following activities:  Blood draw for specimens, development of treatment plan with patient or surrogate, discussions with consultants, evaluation of patient's response to treatment, examination of patient, obtaining history from patient or surrogate, ordering and performing treatments and interventions, ordering and review of laboratory studies, pulse oximetry, re-evaluation of patient's condition, review of old charts and ordering and review of radiographic studies   (including critical care time)  Medications Ordered in ED Medications  sodium chloride flush (NS) 0.9 % injection 3 mL (3 mLs Intravenous Given 08/11/18 1108)  dextrose 5 %-0.45 % sodium chloride infusion ( Intravenous Paused 08/11/18 1247)  insulin regular, human  (MYXREDLIN) 100 units/ 100 mL infusion (10.8 Units/hr Intravenous Rate/Dose Change 08/11/18 1254)  sodium chloride 0.9 % bolus 1,000 mL (1,000 mLs Intravenous New Bag/Given 08/11/18 1149)    And  sodium chloride 0.9 % bolus 1,000 mL (1,000 mLs Intravenous New Bag/Given 08/11/18 1250)    And  sodium chloride 0.9 % bolus 1,000 mL (has no administration in time range)    And  0.9 %  sodium chloride infusion ( Intravenous New Bag/Given 08/11/18 1249)  sodium bicarbonate 150 mEq in sterile water 1,000 mL infusion ( Intravenous New Bag/Given 08/11/18 1253)     Initial Impression / Assessment and Plan / ED Course  I have reviewed the triage vital signs and the nursing notes.  Pertinent labs & imaging results that were available during my care of the patient were reviewed by me and considered in my medical decision making (see chart for details).  Clinical Course as of Aug 10 1400  Fri Aug 11, 2018  1138 Elevated  CBG monitoring, ED(!!) [EW]  1139 Abnormal, potassium high, glucose high, creatinine high, GFR low  Basic metabolic panel(!!) [EW]  1139 Abnormal, pH low, PCO2 low, bicarb low, acid-base high  Blood gas, venous(!!) [EW]  1139 Abnormal, white count high, hematocrit high  CBC(!) [EW]  1154 Call received from radiologist, Dr. Grace Isaac, we discussed the chest x-ray which shows a pneumomediastinum.  No apparent pneumothorax or infiltrate.   [EW]  1155 Pneumomediastinum, no CHF or infiltrate, image reviewed by me  DG Chest Port 1 View [EW]  1228 Normal  SARS Coronavirus 2 [facility] order, Performed in [facility] hospital lab) [EW]    Clinical Course User Index [EW] Mancel Bale, MD        Patient Vitals for the past 24 hrs:  BP Temp Temp src Pulse Resp SpO2 Height Weight  08/11/18 1220 - - - (!) 129 16 100 % - -  08/11/18 1215 - - - (!) 130 (!) 28 100 % - -  08/11/18 1149 - - - (!) 127 (!) 23 100 % - -  08/11/18 1145 - - - (!) 128 16 100 % - -  08/11/18 1130 (!) 147/127 - - - (!) 26  - - -  08/11/18 1129 - 98.6 F (37 C) Rectal - - - - -  08/11/18 1115 - - - (!) 128 (!) 34 100 % - -  08/11/18 1100 124/79 - - - (!) 34 - - -  08/11/18 1021 127/86 98.5 F (36.9 C) Axillary (!) 132 (!) 33 100 % - -  08/11/18 1015 - - - - - 100 % 5\' 4"  (1.626 m) 106.6 kg  08/11/18 1009 - - - - - 98 % - -    12:35 PM Reevaluation with update and discussion. After initial assessment and treatment, an updated evaluation reveals she remains tachypneic, and tachycardic.  Findings discussed with the patient all questions were answered. Mancel Bale   Medical Decision Making: Patient with signs and symptoms of diabetic ketoacidosis, new onset diabetes.  Patient appears severely dehydrated with elevated creatinine from baseline.  Nonspecific white count elevation, likely related to acute illness.  Urinalysis ordered, not yet reported, 1:04 PM.  Fluid management with saline resuscitation and medications, insulin per protocol for DKA.  Incidental pneumomediastinum likely related to vomiting, and tachypnea.  This can be evaluated further with CT imaging after fluid resuscitation to improve renal function.  Note is for pneumothorax, or requirement for advanced airway, at this time.  She will require admission to an intensive care unit for close monitoring and treatment.  CRITICAL CARE-yes Performed by: Mancel Bale   Nursing Notes Reviewed/ Care Coordinated Applicable Imaging Reviewed Interpretation of Laboratory Data incorporated into ED treatment   1:09 PM-Consult complete with hospitalist. Patient case explained and discussed.  He agrees to admit patient for further evaluation and treatment. Call ended at 2:02 PM  Plan: Admit  Final Clinical Impressions(s) / ED Diagnoses   Final diagnoses:  Diabetic ketoacidosis without coma associated with type 1 diabetes mellitus (HCC)  Pneumomediastinum (HCC)  AKI (acute kidney injury) (HCC)  Hyperkalemia    ED Discharge Orders    None        Mancel Bale, MD 08/11/18 1402    Mancel Bale, MD 08/11/18 [phone]

## 2018-08-11 NOTE — ED Notes (Signed)
Date and time results received: 08/11/18 11:04 AM  Test: Glucose Critical Value: 694  Name of Provider Notified: Wentz,MD  Awaiting orders

## 2018-08-11 NOTE — ED Notes (Signed)
Pure wick has been placed. Pt has been educated on pure wick. Suction set to 45mmHg 

## 2018-08-11 NOTE — H&P (Addendum)
[facility] History and Physical  Kevyn Minelli Meller ASN:[id number] DOB: [birth year] DOA: 08/11/2018  Referring physician:  PCP: [place], Betty G, MD   Chief Complaint: DKA/diabetes type 2 uncontrolled  HPI: Kathleen [name] is a 33 y.o. female She presents for evaluation of shortness of breath, and "trouble breathing," since this morning.  She is also had increased urinary frequency, and drinking more fluids than usual.  She had a couple episodes of vomiting today.  She denies cough, diarrhea, documented fever, back pain, focal weakness or paresthesia.  Today she has felt cold but not taken her temperature.  She does not have any known sick contacts.  She states that she has been trying to diet using a liquid diet for about a month.  She has never been diagnosed with diabetes.  She has no known sick contacts.  No recent travel.  There are no other known modifying factors.      Review of Systems:  Constitutional:  No weight loss, night sweats, Fevers, positive chills, fatigue.  HEENT:  No headaches, Difficulty swallowing,Tooth/dental problems,Sore throat,  No sneezing, itching, ear ache, nasal congestion, post nasal drip,  Cardio-vascular:  No chest pain, Orthopnea, PND, swelling in lower extremities, anasarca, dizziness, palpitations  GI:  No heartburn, indigestion, abdominal pain, nausea, vomiting, diarrhea, change in bowel habits, loss of appetite  Resp:  positive shortness of breath with exertion or at rest. No excess mucus, no productive cough, No non-productive cough, No coughing up of blood.No change in color of mucus.No wheezing.No chest wall deformity  Skin:  no rash or lesions.  GU:  no dysuria, change in color of urine, no urgency or frequency. No flank pain.  Musculoskeletal:  No joint pain or swelling. No decreased range of motion. No back pain.  Psych:  positivechange in mood or affect. No depression or anxiety. No memory loss.   Past Medical History:   Diagnosis Date  . Allergy   . Bronchitis    No past surgical history on file. Social History:  reports that she has never smoked. She has never used smokeless tobacco. She reports current alcohol use of about 1.0 standard drinks of alcohol per week. She reports that she does not use drugs.  Allergies  Allergen Reactions  . Amoxicillin Other (See Comments)    Pain in back  . Corn-Containing Products Anaphylaxis and Hives  . Penicillins Other (See Comments)    Pain in back  . Fruit & Vegetable Daily [Nutritional Supplements]     Allergy to fruits  . Peanut-Containing Drug Products Hives  . Shellfish Allergy Hives, Itching and Swelling  . Wheat Hives and Itching    Family History  Problem Relation Age of Onset  . Diabetes Father   . Diabetes Maternal Grandmother   . Cancer Neg Hx   . Hypertension Neg Hx   . Stroke Neg Hx         Prior to Admission medications   Medication Sig Start Date End Date Taking? Authorizing Provider  EPINEPHrine (EPIPEN) 0.3 mg/0.3 mL IJ SOAJ injection Inject 0.3 mLs (0.3 mg total) into the muscle once as needed (for severe allergic reaction). 01/04/17   [place], Betty G, MD  levonorgestrel-ethinyl estradiol (LEVORA 0.15/30, 28,) 0.15-30 MG-MCG tablet Take 1 tablet by mouth daily. 02/23/18   [place], Betty G, MD  Pseudoephedrine-APAP-DM (DAYQUIL MULTI-SYMPTOM PO) Take 30 mLs by mouth daily as needed (for cough). Reported on 04/30/2015    [provider]     Consultants:  Procedures/Significant Events:  5/8 PCXR:-Pneumomediastinum seen along the left heart border and the descending aorta. Small volume gas is seen in the neck.    I have personally reviewed and interpreted all radiology studies and my findings are as above.   VENTILATOR SETTINGS:    Cultures   Antimicrobials:    Devices    LINES / TUBES:      Continuous Infusions: . sodium chloride     And  . sodium chloride 125 mL/hr at 08/11/18 1249  . dextrose 5  % and 0.45% NaCl Stopped (08/11/18 1247)  . insulin 10.8 Units/hr (08/11/18 1254)    Physical Exam: Vitals:   08/11/18 1145 08/11/18 1149 08/11/18 1215 08/11/18 1220  BP:      Pulse: (!) 128 (!) 127 (!) 130 (!) 129  Resp: 16 (!) 23 (!) 28 16  Temp:      TempSrc:      SpO2: 100% 100% 100% 100%  Weight:      Height:        Wt Readings from Last 3 Encounters:  08/11/18 106.6 kg  01/04/17 105.9 kg  04/30/15 100.4 kg    General: A/O x3 (does not know when) positive acute respiratory distress Eyes: negative scleral hemorrhage, negative anisocoria, negative icterus ENT: Negative Runny nose, negative gingival bleeding, Neck:  Negative scars, masses, torticollis, lymphadenopathy, JVD Lungs: Tachypneic, clear to auscultation bilaterally without wheezes or crackles Cardiovascular: Tachycardic, regular rate and rhythm without murmur gallop or rub normal S1 and S2 Abdomen: negative abdominal pain, nondistended, positive soft, bowel sounds, no rebound, no ascites, no appreciable mass Extremities: No significant cyanosis, clubbing, or edema bilateral lower extremities Skin: Negative rashes, lesions, ulcers Psychiatric:  Negative depression, negative anxiety, negative fatigue, negative mania  Central nervous system:  Cranial nerves II through XII intact, tongue/uvula midline, all extremities muscle strength 5/5, negative dysarthria, negative expressive aphasia, negative receptive aphasia.        Labs on Admission:  Basic Metabolic Panel: Recent Labs  Lab 08/11/18 1022  NA 136  K 5.4*  CL 108  CO2 <7*  GLUCOSE 694*  BUN 14  CREATININE 1.98*  CALCIUM 8.0*   Liver Function Tests: No results for input(s): AST, ALT, ALKPHOS, BILITOT, PROT, ALBUMIN in the last 168 hours. No results for input(s): LIPASE, AMYLASE in the last 168 hours. No results for input(s): AMMONIA in the last 168 hours. CBC: Recent Labs  Lab 08/11/18 1022  WBC 25.8*  HGB 14.3  HCT 47.0*  MCV 90.2  PLT 291    Cardiac Enzymes: No results for input(s): CKTOTAL, CKMB, CKMBINDEX, TROPONINI in the last 168 hours.  BNP (last 3 results) No results for input(s): BNP in the last 8760 hours.  ProBNP (last 3 results) No results for input(s): PROBNP in the last 8760 hours.  CBG: Recent Labs  Lab 08/11/18 1011 08/11/18 1125 08/11/18 1240  GLUCAP >600* >600* >600*    Radiological Exams on Admission: Dg Chest Port 1 View  Result Date: 08/11/2018 CLINICAL DATA:  Shortness of breath EXAM: PORTABLE CHEST 1 VIEW COMPARISON:  04/13/2013 FINDINGS: Pneumomediastinum seen along the left heart border and the descending aorta. Small volume gas is seen in the neck. There is no history of vomiting and source is uncertain, presumably barotrauma. No evidence of pneumothorax. The lungs appear clear and normal volume. Normal heart size. IMPRESSION: 1. Pneumomediastinum. 2. Clear lungs. Electronically Signed   By: Marnee SpringJonathon  Watts M.D.   On: 08/11/2018 11:52    EKG: Pending  Assessment/Plan  Active Problems:   * No active hospital problems. *  DKA (new onset)/Diabetes type 2 uncontrolled -Admit glucose stabilizer protocol - Sodium bicarb drip 138ml/h:x 1 L - Sodium bicarb 1 amp (50 mEq) x1; after 1814 BMP repeat sodium bicarb 1 amp (50 mEq) x1 -BMP shows HCO3 =7 - 1841 ABG repeat 7.2, PCO2= 21.4, PO2= 107 -Hemoglobin A1c pending -Administer Lantus 10 units ONLY AFTER patient has had 4 consecutive CBG<200 -Start moderate SSI ONLY AFTER patient has had 4 consecutive CBG<200  HLD -Lipid panel pending   Pneumomediastinum -Will require CT chest with contrast once patient's renal failure improved - 1830 patient continues with tachypnea could be secondary to DKA however will obtain repeat PCXR to ensure no interval change all pneumomediastinum  AKI vs CKD - Last creatinine 01/12/2017= 0.88 - Most likely AKI but will need to monitor closely to see if this is a permanent change in renal status that has worsened  over the years.      Code Status: Full (DVT Prophylaxis: Lovenox Family Communication: None Disposition Plan: TBD   Data Reviewed: Care during the described time interval was provided by me .  I have reviewed this patient's available data, including medical history, events of note, physical examination, and all test results as part of my evaluation.   The patient is critically ill with multiple organ systems failure and requires high complexity decision making for assessment and support, frequent evaluation and titration of therapies, application of advanced monitoring technologies and extensive interpretation of multiple databases. Critical Care Time devoted to patient care services described in this note  Time spent: 70 minutes   WOODS, Roselind Messier [facility] Pager [phone]

## 2018-08-11 NOTE — ED Notes (Signed)
ED TO INPATIENT HANDOFF REPORT  ED Nurse Name and Phone #: [place], [place]  S Name/Age/Gender Kathleen [name] 33 y.o. female Room/Bed: WA11/WA11  Code Status   Code Status: Full Code  Home/SNF/Other Home Patient oriented to: self, place, time and situation Is this baseline? Yes   Triage Complete: Triage complete  Chief Complaint frequent urination thirst hypoglycemic  Triage Note Pt BIBA from home c/o polyuria and polydipsia x3 days.  Pt reports fatigue for same amount of time. Pt states that shes been on a liquid diet x4 weeks in an effort to lose weight.    Normally ambulatory, unable to at this time. AOx4.    Allergies Allergies  Allergen Reactions  . Amoxicillin Other (See Comments)    Pain in back  . Corn-Containing Products Anaphylaxis and Hives  . Penicillins Other (See Comments)    Pain in back  . Fruit & Vegetable Daily [Nutritional Supplements]     Allergy to fruits  . Peanut-Containing Drug Products Hives  . Shellfish Allergy Hives, Itching and Swelling  . Wheat Hives and Itching    Level of Care/Admitting Diagnosis ED Disposition    ED Disposition Condition Comment   Admit  Hospital Area: [facility] [[id number]]  Level of Care: Stepdown [14]  Admit to SDU based on following criteria: Other see comments  Comments: New onset DKA on glucose stabilizer protocol  Covid Evaluation: N/A  Diagnosis: Secondary DM with DKA, uncontrolled (HCC) [[id number]]  Admitting Physician: Drema Dallas [[id number]]  Attending Physician: Drema Dallas [[id number]]  Estimated length of stay: 3 - 4 days  Certification:: I certify this patient will need inpatient services for at least 2 midnights  PT Class (Do Not Modify): Inpatient [101]  PT Acc Code (Do Not Modify): Private [1]       B Medical/Surgery History Past Medical History:  Diagnosis Date  . Allergy   . Bronchitis    No past surgical history on file.   A IV  Location/Drains/Wounds Patient Lines/Drains/Airways Status   Active Line/Drains/Airways    Name:   Placement date:   Placement time:   Site:   Days:   Peripheral IV 08/11/18 Right Antecubital   08/11/18    -    Antecubital   less than 1   External Urinary Catheter   08/11/18    -    -   less than 1          Intake/Output Last 24 hours  Intake/Output Summary (Last 24 hours) at 08/11/2018 1634 Last data filed at 08/11/2018 1008 Gross per 24 hour  Intake 150 ml  Output -  Net 150 ml    Labs/Imaging Results for orders placed or performed during the hospital encounter of 08/11/18 (from the past 48 hour(s))  CBG monitoring, ED     Status: Abnormal   Collection Time: 08/11/18 10:11 AM  Result Value Ref Range   Glucose-Capillary >600 (HH) 70 - 99 mg/dL  Basic metabolic panel     Status: Abnormal   Collection Time: 08/11/18 10:22 AM  Result Value Ref Range   Sodium 136 135 - 145 mmol/L    Comment: REPEATED TO VERIFY   Potassium 5.4 (H) 3.5 - 5.1 mmol/L    Comment: SLIGHT HEMOLYSIS   Chloride 108 98 - 111 mmol/L    Comment: REPEATED TO VERIFY   CO2 <7 (L) 22 - 32 mmol/L    Comment: REPEATED TO VERIFY   Glucose, Bld 694 (HH) 70 - 99  mg/dL    Comment: CRITICAL RESULT CALLED TO, READ BACK BY AND VERIFIED WITH: VAUGHN,L. RN AT 1104 08/11/18 MULLINS,T    BUN 14 6 - 20 mg/dL   Creatinine, Ser 1.91 (H) 0.44 - 1.00 mg/dL   Calcium 8.0 (L) 8.9 - 10.3 mg/dL   GFR calc non Af Amer 33 (L) >60 mL/min   GFR calc Af Amer 38 (L) >60 mL/min   Anion gap NOT CALCULATED 5 - 15    Comment: Performed at [facility], [address], [place], [place] [address]  CBC     Status: Abnormal   Collection Time: 08/11/18 10:22 AM  Result Value Ref Range   WBC 25.8 (H) 4.0 - 10.5 K/uL   RBC 5.21 (H) 3.87 - 5.11 MIL/uL   Hemoglobin 14.3 12.0 - 15.0 g/dL   HCT 56.2 (H) 13.0 - 86.5 %   MCV 90.2 80.0 - 100.0 fL   MCH 27.4 26.0 - 34.0 pg   MCHC 30.4 30.0 - 36.0 g/dL   RDW 78.4 69.6 - 29.5  %   Platelets 291 150 - 400 K/uL   nRBC 0.0 0.0 - 0.2 %    Comment: Performed at [facility], [address] [place], [place] [address]  I-Stat beta hCG blood, ED     Status: None   Collection Time: 08/11/18 10:29 AM  Result Value Ref Range   I-stat hCG, quantitative <5.0 <5 mIU/mL   Comment 3            Comment:   GEST. AGE      CONC.  (mIU/mL)   <=1 WEEK        5 - 50     2 WEEKS       50 - 500     3 WEEKS       100 - 10,000     4 WEEKS     1,000 - 30,000        FEMALE AND NON-PREGNANT FEMALE:     LESS THAN 5 mIU/mL   ABO/Rh     Status: None   Collection Time: 08/11/18 10:37 AM  Result Value Ref Range   ABO/RH(D) B POS    No rh immune globuloin      NOT A RH IMMUNE GLOBULIN CANDIDATE, PT RH POSITIVE Performed at [facility], [address] [place], [place] [address]   SARS Coronavirus 2 [facility] Main order, Performed in [facility] hospital lab)     Status: None   Collection Time: 08/11/18 10:38 AM  Result Value Ref Range   SARS Coronavirus 2 NEGATIVE NEGATIVE    Comment: (NOTE) If result is NEGATIVE SARS-CoV-2 target nucleic acids are NOT DETECTED. The SARS-CoV-2 RNA is generally detectable in upper and lower  respiratory specimens during the acute phase of infection. The lowest  concentration of SARS-CoV-2 viral copies this assay can detect is 250  copies / mL. A negative result does not preclude SARS-CoV-2 infection  and should not be used as the sole basis for treatment or other  patient management decisions.  A negative result may occur with  improper specimen collection / handling, submission of specimen other  than nasopharyngeal swab, presence of viral mutation(s) within the  areas targeted by this assay, and inadequate number of viral copies  (<250 copies / mL). A negative result must be combined with clinical  observations, patient history, and epidemiological information. If result is POSITIVE SARS-CoV-2 target nucleic  acids are DETECTED.  The SARS-CoV-2 RNA is generally detectable in upper and lower  respiratory specimens dur ing the acute phase of infection.  Positive  results are indicative of active infection with SARS-CoV-2.  Clinical  correlation with patient history and other diagnostic information is  necessary to determine patient infection status.  Positive results do  not rule out bacterial infection or co-infection with other viruses. If result is PRESUMPTIVE POSTIVE SARS-CoV-2 nucleic acids MAY BE PRESENT.   A presumptive positive result was obtained on the submitted specimen  and confirmed on repeat testing.  While 2019 novel coronavirus  (SARS-CoV-2) nucleic acids may be present in the submitted sample  additional confirmatory testing may be necessary for epidemiological  and / or clinical management purposes  to differentiate between  SARS-CoV-2 and other Sarbecovirus currently known to infect humans.  If clinically indicated additional testing with an alternate test  methodology [phone]) is advised. The SARS-CoV-2 RNA is generally  detectable in upper and lower respiratory sp ecimens during the acute  phase of infection. The expected result is Negative. Fact Sheet for Patients:  BoilerBrush.com.cy Fact Sheet for Healthcare Providers: https://pope.com/ This test is not yet approved or cleared by the [place] FDA and has been authorized for detection and/or diagnosis of SARS-CoV-2 by FDA under an Emergency Use Authorization (EUA).  This EUA will remain in effect (meaning this test can be used) for the duration of the COVID-19 declaration under Section 564(b)(1) of the Act, 21 U.S.C. section 360bbb-3(b)(1), unless the authorization is terminated or revoked sooner. Performed at [facility], [address], [place], [place] [address]   Blood gas, venous     Status: Abnormal   Collection Time: 08/11/18 11:11 AM   Result Value Ref Range   pH, Ven 6.884 (LL) 7.250 - 7.430    Comment: CRITICAL RESULT CALLED TO, READ BACK BY AND VERIFIED WITH: dr Gray Bernhardt by Currie Paris rrt rcp on 08/11/18 at 1114    pCO2, Ven 28.0 (L) 44.0 - 60.0 mmHg   pO2, Ven 34.5 32.0 - 45.0 mmHg   Bicarbonate 5.0 (L) 20.0 - 28.0 mmol/L   Acid-base deficit 28.5 (H) 0.0 - 2.0 mmol/L   O2 Saturation 56.0 %   Patient temperature 98.6    Collection site VEIN    Drawn by DRAWN BY RN    Sample type VENOUS     Comment: Performed at [facility], [address], [place], [place] [address]  CBG monitoring, ED     Status: Abnormal   Collection Time: 08/11/18 11:25 AM  Result Value Ref Range   Glucose-Capillary >600 (HH) 70 - 99 mg/dL  CBG monitoring, ED     Status: Abnormal   Collection Time: 08/11/18 12:40 PM  Result Value Ref Range   Glucose-Capillary >600 (HH) 70 - 99 mg/dL  CBG monitoring, ED     Status: Abnormal   Collection Time: 08/11/18  2:01 PM  Result Value Ref Range   Glucose-Capillary 463 (H) 70 - 99 mg/dL  CBG monitoring, ED     Status: Abnormal   Collection Time: 08/11/18  2:39 PM  Result Value Ref Range   Glucose-Capillary 498 (H) 70 - 99 mg/dL  Urinalysis, Routine w reflex microscopic     Status: Abnormal   Collection Time: 08/11/18  2:56 PM  Result Value Ref Range   Color, Urine YELLOW YELLOW   APPearance HAZY (A) CLEAR   Specific Gravity, Urine 1.024 1.005 - 1.030   pH 5.0 5.0 -  8.0   Glucose, UA >=500 (A) NEGATIVE mg/dL   Hgb urine dipstick SMALL (A) NEGATIVE   Bilirubin Urine NEGATIVE NEGATIVE   Ketones, ur 80 (A) NEGATIVE mg/dL   Protein, ur 161 (A) NEGATIVE mg/dL   Nitrite NEGATIVE NEGATIVE   Leukocytes,Ua NEGATIVE NEGATIVE   RBC / HPF 0-5 0 - 5 RBC/hpf   WBC, UA 0-5 0 - 5 WBC/hpf   Bacteria, UA RARE (A) NONE SEEN   Squamous Epithelial / LPF 11-20 0 - 5    Comment: Performed at [facility], [address], [place], [place] [address]  Blood gas, arterial      Status: Abnormal   Collection Time: 08/11/18  3:26 PM  Result Value Ref Range   pH, Arterial 7.014 (LL) 7.350 - 7.450   pCO2 arterial BELOW REPORTABLE RANGE 32.0 - 48.0 mmHg    Comment: CRITICAL RESULT CALLED TO, READ BACK BY AND VERIFIED WITH: CURTIS WOODS MD BY ANGIE DUNLAP RRT RCP ON 08/11/18 AT 1537    pO2, Arterial 109 (H) 83.0 - 108.0 mmHg   Bicarbonate BELOW REPORTABLE RANGE 20.0 - 28.0 mmol/L    Comment: CRITICAL RESULT CALLED TO, READ BACK BY AND VERIFIED WITH: CURTIS WOODS MD BY ANGIE DUNLAP RRT RCP ON 08/11/18 1538    Acid-Base Excess BELOW REPORTABLE RANGE 0.0 - 2.0 mmol/L    Comment: CRITICAL RESULT CALLED TO, READ BACK BY AND VERIFIED WITH: CURTIS WOODS MD BY ANGIE DUNLAP RRT RCP ON 08/11/18 AT 1537    Acid-base deficit BELOW REPORTABLE RANGE 0.0 - 2.0 mmol/L    Comment: CRITICAL RESULT CALLED TO, READ BACK BY AND VERIFIED WITH: CURTIS WOODS MD BY ANGIE DUNLAP RRT RCP ON 08/11/18 AT 1537    O2 Saturation 97.2 %   Patient temperature 98.6    Collection site RIGHT RADIAL    Drawn by [id number]    Sample type ARTERIAL DRAW    Allens test (pass/fail) PASS PASS   Mechanical Rate PASS     Comment: Performed at [facility], [address], [place], [place] [address]  CBG monitoring, ED     Status: Abnormal   Collection Time: 08/11/18  4:12 PM  Result Value Ref Range   Glucose-Capillary 297 (H) 70 - 99 mg/dL   Dg Chest Port 1 View  Result Date: 08/11/2018 CLINICAL DATA:  Shortness of breath EXAM: PORTABLE CHEST 1 VIEW COMPARISON:  04/13/2013 FINDINGS: Pneumomediastinum seen along the left heart border and the descending aorta. Small volume gas is seen in the neck. There is no history of vomiting and source is uncertain, presumably barotrauma. No evidence of pneumothorax. The lungs appear clear and normal volume. Normal heart size. IMPRESSION: 1. Pneumomediastinum. 2. Clear lungs. Electronically Signed   By: Marnee Spring M.D.   On: 08/11/2018 11:52    Pending  Labs Unresulted Labs (From admission, onward)    Start     Ordered   08/18/18 0500  Creatinine, serum  (enoxaparin (LOVENOX)    CrCl >/= 30 ml/min)  Weekly,   R    Comments:  while on enoxaparin therapy    08/11/18 1416   08/11/18 1527  Basic metabolic panel  ONCE - STAT,   R     08/11/18 1526   08/11/18 1526  Magnesium  ONCE - STAT,   R     08/11/18 1526   08/11/18 1416  Hemoglobin A1c  ONCE - STAT,   R     08/11/18 1416   08/11/18  1416  Lipid panel  Once,   R     08/11/18 1416   08/11/18 1416  Lipid panel  Once,   R     08/11/18 1416   08/11/18 1415  Magnesium  Now then every 4 hours,   R     08/11/18 1416   08/11/18 1415  CBC  Now then every 12 hours,   R     08/11/18 1416   08/11/18 1415  Urine culture  ONCE - STAT,   R     08/11/18 1416   08/11/18 1415  TSH  Once,   R     08/11/18 1416   08/11/18 1414  Basic metabolic panel  Now then every 4 hours,   R     08/11/18 1416   08/11/18 1410  CBC  (enoxaparin (LOVENOX)    CrCl >/= 30 ml/min)  Once,   R    Comments:  Baseline for enoxaparin therapy IF NOT ALREADY DRAWN.  Notify MD if PLT < 100 K.    08/11/18 1416   08/11/18 1303  Basic metabolic panel  Once,   STAT     08/11/18 1302   08/11/18 1113  HIV Antibody (routine testing w rflx)  Once,   R     08/11/18 1113          Vitals/Pain Today's Vitals   08/11/18 1529 08/11/18 1530 08/11/18 1600 08/11/18 1615  BP:  (!) 141/64 127/72   Pulse: (!) 130  (!) 129 (!) 133  Resp: (!) 26 (!) 29 (!) 27 (!) 25  Temp:      TempSrc:      SpO2: 100%  100% 100%  Weight:      Height:      PainSc:        Isolation Precautions No active isolations  Medications Medications  sodium chloride flush (NS) 0.9 % injection 3 mL (3 mLs Intravenous Given 08/11/18 1108)  dextrose 5 %-0.45 % sodium chloride infusion ( Intravenous Hold 08/11/18 1422)  insulin regular bolus via infusion 0-10 Units (has no administration in time range)  insulin regular, human (MYXREDLIN) 100 units/ 100 mL  infusion (4.7 Units/hr Intravenous Rate/Dose Change 08/11/18 1630)  dextrose 50 % solution 25 mL (has no administration in time range)  0.9 %  sodium chloride infusion ( Intravenous New Bag/Given 08/11/18 1631)  enoxaparin (LOVENOX) injection 40 mg (has no administration in time range)  living well with diabetes book MISC (has no administration in time range)  sodium chloride 0.9 % bolus 1,000 mL (0 mLs Intravenous Stopped 08/11/18 1410)    And  sodium chloride 0.9 % bolus 1,000 mL (0 mLs Intravenous Stopped 08/11/18 1410)    And  sodium chloride 0.9 % bolus 1,000 mL (0 mLs Intravenous Stopped 08/11/18 1627)  sodium bicarbonate 150 mEq in sterile water 1,000 mL infusion ( Intravenous Paused 08/11/18 1503)    Mobility walks Moderate fall risk   Focused Assessments Cardiac Assessment Handoff:  Cardiac Rhythm: Sinus tachycardia No results found for: CKTOTAL, CKMB, CKMBINDEX, TROPONINI No results found for: DDIMER Does the Patient currently have chest pain? No      R Recommendations: See Admitting Provider Note  Report given to:   Additional Notes:

## 2018-08-11 NOTE — ED Notes (Signed)
Bed: NI77 Expected date:  Expected time:  Means of arrival:  Comments: Thirst and frequent urination

## 2018-08-11 NOTE — ED Notes (Signed)
Report called to floorApolonio Schneiders, RN

## 2018-08-11 NOTE — ED Notes (Signed)
Pt stated that she has been on a liquid fast since Monday, drinking  "anything and everything liquid"  "I stopped drinking sodas, but anything else liquid."

## 2018-08-12 ENCOUNTER — Inpatient Hospital Stay (HOSPITAL_COMMUNITY): Payer: No Typology Code available for payment source

## 2018-08-12 LAB — BLOOD GAS, ARTERIAL
Acid-base deficit: 4.2 mmol/L — ABNORMAL HIGH (ref 0.0–2.0)
Bicarbonate: 18.7 mmol/L — ABNORMAL LOW (ref 20.0–28.0)
Drawn by: 441261
O2 Content: 2 L/min
O2 Saturation: 99.2 %
Patient temperature: 37
pCO2 arterial: 28.8 mmHg — ABNORMAL LOW (ref 32.0–48.0)
pH, Arterial: 7.427 (ref 7.350–7.450)
pO2, Arterial: 123 mmHg — ABNORMAL HIGH (ref 83.0–108.0)

## 2018-08-12 LAB — GLUCOSE, CAPILLARY
Glucose-Capillary: 118 mg/dL — ABNORMAL HIGH (ref 70–99)
Glucose-Capillary: 140 mg/dL — ABNORMAL HIGH (ref 70–99)
Glucose-Capillary: 144 mg/dL — ABNORMAL HIGH (ref 70–99)
Glucose-Capillary: 149 mg/dL — ABNORMAL HIGH (ref 70–99)
Glucose-Capillary: 157 mg/dL — ABNORMAL HIGH (ref 70–99)
Glucose-Capillary: 157 mg/dL — ABNORMAL HIGH (ref 70–99)
Glucose-Capillary: 161 mg/dL — ABNORMAL HIGH (ref 70–99)
Glucose-Capillary: 162 mg/dL — ABNORMAL HIGH (ref 70–99)
Glucose-Capillary: 163 mg/dL — ABNORMAL HIGH (ref 70–99)
Glucose-Capillary: 171 mg/dL — ABNORMAL HIGH (ref 70–99)
Glucose-Capillary: 214 mg/dL — ABNORMAL HIGH (ref 70–99)
Glucose-Capillary: 384 mg/dL — ABNORMAL HIGH (ref 70–99)

## 2018-08-12 LAB — BASIC METABOLIC PANEL
Anion gap: 15 (ref 5–15)
Anion gap: 15 (ref 5–15)
Anion gap: 16 — ABNORMAL HIGH (ref 5–15)
BUN: 11 mg/dL (ref 6–20)
BUN: 11 mg/dL (ref 6–20)
BUN: 13 mg/dL (ref 6–20)
CO2: 16 mmol/L — ABNORMAL LOW (ref 22–32)
CO2: 17 mmol/L — ABNORMAL LOW (ref 22–32)
CO2: 11 mmol/L — ABNORMAL LOW (ref 22–32)
Calcium: 8 mg/dL — ABNORMAL LOW (ref 8.9–10.3)
Calcium: 8.3 mg/dL — ABNORMAL LOW (ref 8.9–10.3)
Calcium: 8.3 mg/dL — ABNORMAL LOW (ref 8.9–10.3)
Chloride: 113 mmol/L — ABNORMAL HIGH (ref 98–111)
Chloride: 113 mmol/L — ABNORMAL HIGH (ref 98–111)
Chloride: 110 mmol/L (ref 98–111)
Creatinine, Ser: 1.12 mg/dL — ABNORMAL HIGH (ref 0.44–1.00)
Creatinine, Ser: 1.06 mg/dL — ABNORMAL HIGH (ref 0.44–1.00)
Creatinine, Ser: 1.17 mg/dL — ABNORMAL HIGH (ref 0.44–1.00)
GFR calc Af Amer: >60 mL/min (ref >60)
GFR calc Af Amer: >60 mL/min (ref >60)
GFR calc Af Amer: >60 mL/min (ref >60)
GFR calc non Af Amer: >60 mL/min (ref >60)
GFR calc non Af Amer: >60 mL/min (ref >60)
GFR calc non Af Amer: >60 mL/min (ref >60)
Glucose, Bld: 173 mg/dL — ABNORMAL HIGH (ref 70–99)
Glucose, Bld: 165 mg/dL — ABNORMAL HIGH (ref 70–99)
Glucose, Bld: 377 mg/dL — ABNORMAL HIGH (ref 70–99)
Potassium: 3.8 mmol/L (ref 3.5–5.1)
Potassium: 2.5 mmol/L — CL (ref 3.5–5.1)
Potassium: 3.4 mmol/L — ABNORMAL LOW (ref 3.5–5.1)
Sodium: 144 mmol/L (ref 135–145)
Sodium: 145 mmol/L (ref 135–145)
Sodium: 137 mmol/L (ref 135–145)

## 2018-08-12 LAB — LIPID PANEL
Cholesterol: 158 mg/dL (ref 0–200)
HDL: 31 mg/dL — ABNORMAL LOW (ref >40)
Total CHOL/HDL Ratio: 5.1 RATIO
Triglycerides: 105 mg/dL (ref <150)
VLDL: 21 mg/dL (ref 0–40)

## 2018-08-12 LAB — URINE CULTURE

## 2018-08-12 LAB — MAGNESIUM
Magnesium: 1.8 mg/dL (ref 1.7–2.4)
Magnesium: 1.9 mg/dL (ref 1.7–2.4)

## 2018-08-12 LAB — CBC
HCT: 37 % (ref 36.0–46.0)
Hemoglobin: 12.6 g/dL (ref 12.0–15.0)
MCH: 27.4 pg (ref 26.0–34.0)
MCHC: 34.1 g/dL (ref 30.0–36.0)
MCV: 80.4 fL (ref 80.0–100.0)
Platelets: 203 10*3/uL (ref 150–400)
RBC: 4.6 MIL/uL (ref 3.87–5.11)
RDW: 38.4 % — ABNORMAL HIGH (ref 11.5–15.5)
WBC: 15.4 10*3/uL — ABNORMAL HIGH (ref 4.0–10.5)
nRBC: 0.2 % (ref 0.0–0.2)

## 2018-08-12 LAB — HCG, QUANTITATIVE, PREGNANCY: hCG, Beta Chain, Quant, S: <1 m[IU]/mL (ref <5)

## 2018-08-12 LAB — HIV ANTIBODY (ROUTINE TESTING W REFLEX): HIV Screen 4th Generation wRfx: NONREACTIVE

## 2018-08-12 MED ORDER — INSULIN DETEMIR 100 UNIT/ML ~~LOC~~ SOLN
8.0000 [IU] | Freq: Every day | SUBCUTANEOUS | Status: DC
  Administered 2018-08-12 – 2018-08-13 (×2): 8 [IU] via SUBCUTANEOUS
  Filled 2018-08-12 (×2): qty 0.08

## 2018-08-12 MED ORDER — SODIUM CHLORIDE 0.45 % IV SOLN
INTRAVENOUS | Status: DC
  Administered 2018-08-12 – 2018-08-13 (×3): via INTRAVENOUS
  Filled 2018-08-12 (×6): qty 1000

## 2018-08-12 MED ORDER — PANTOPRAZOLE SODIUM 40 MG IV SOLR
40.0000 mg | Freq: Two times a day (BID) | INTRAVENOUS | Status: DC
  Administered 2018-08-12 (×2): 40 mg via INTRAVENOUS
  Filled 2018-08-12 (×2): qty 40

## 2018-08-12 MED ORDER — INSULIN ASPART 100 UNIT/ML ~~LOC~~ SOLN
0.0000 [IU] | Freq: Three times a day (TID) | SUBCUTANEOUS | Status: DC
  Administered 2018-08-12: 11:00:00 2 [IU] via SUBCUTANEOUS
  Administered 2018-08-12: 9 [IU] via SUBCUTANEOUS
  Administered 2018-08-13: 3 [IU] via SUBCUTANEOUS
  Administered 2018-08-13 (×2): 9 [IU] via SUBCUTANEOUS
  Administered 2018-08-14: 5 [IU] via SUBCUTANEOUS

## 2018-08-12 MED ORDER — LIP MEDEX EX OINT
TOPICAL_OINTMENT | CUTANEOUS | Status: AC
  Administered 2018-08-12: 1
  Filled 2018-08-12: qty 7

## 2018-08-12 MED ORDER — LIP MEDEX EX OINT
TOPICAL_OINTMENT | CUTANEOUS | Status: DC | PRN
  Administered 2018-08-12: 1 via TOPICAL
  Filled 2018-08-12: qty 7

## 2018-08-12 MED ORDER — INSULIN ASPART 100 UNIT/ML ~~LOC~~ SOLN
3.0000 [IU] | Freq: Three times a day (TID) | SUBCUTANEOUS | Status: DC
  Administered 2018-08-12 – 2018-08-14 (×6): 3 [IU] via SUBCUTANEOUS

## 2018-08-12 MED ORDER — INSULIN STARTER KIT- PEN NEEDLES (ENGLISH)
1.0000 | Freq: Once | Status: DC
  Filled 2018-08-12 (×3): qty 1

## 2018-08-12 MED ORDER — CHLORHEXIDINE GLUCONATE CLOTH 2 % EX PADS
6.0000 | MEDICATED_PAD | Freq: Every day | CUTANEOUS | Status: DC
  Administered 2018-08-12 – 2018-08-13 (×2): 6 via TOPICAL

## 2018-08-12 NOTE — Progress Notes (Addendum)
Inpatient Diabetes Program Recommendations  AACE/ADA: New Consensus Statement on Inpatient Glycemic Control (2015)  Target Ranges:  Prepandial:   less than 140 mg/dL      Peak postprandial:   less than 180 mg/dL (1-2 hours)      Critically ill patients:  140 - 180 mg/dL   Lab Results  Component Value Date   GLUCAP 118 (H) 08/12/2018   HGBA1C 6.5 01/12/2017    Review of Glycemic Control Results for Kathleen [name], Kathleen [name] (MRN [id number]) as of 08/12/2018 09:34  Ref. Range 08/12/2018 05:22 08/12/2018 06:28  Glucose-Capillary Latest Ref Range: 70 - 99 mg/dL 794 (H) 801 (H)   Diabetes history: New Dx. DM Outpatient Diabetes medications: None Current orders for Inpatient glycemic control:  Transitioning off insulin drip to Levemir 8 units daily, Novolog 3 units tid with meals, and Novolog sensitive tid with meals and HS  Inpatient Diabetes Program Recommendations:    Note new diagnosis.  Please consider adding A1c to labs.  May also consider adding c-peptide to determine if patient is producing insulin?  Consider increasing Levemir to 8 units bid.  Will attempt to talk to patient today regarding insulin.  It does not appear that patient has insurance so will also order care management consult.   Thanks  Beryl Meager, RN, BC-ADM Inpatient Diabetes Coordinator Pager [phone] (8a-5p)   Addendum:  Spoke to patient by phone regarding new diagnosis. Patient is very surprised regarding new diagnosis.  She was told in the past that she had borderline DM.  She asked if she had Type 1 vs. Type 2 DM.  We reviewed both and I explained that it will likely take more testing to determine. She has multiple food allergies.   Discussed what DKA is and that she definitely will need insulin at this time.  RN will review insulin administration, checking blood sugars and have patient watch videos regarding DM.  A1C pending.  Will follow-up on 5/10- She states that she works for [employer] care and does  have insurance for medications.  Patient is overwhelmed with new dx. But very interested in learning.

## 2018-08-12 NOTE — Progress Notes (Signed)
[facility] PROGRESS NOTE  Kathleen [name] MRN:[id number] DOB: 12/08/1985 DOA: 08/11/2018 PCP: Jordan, Betty G, MD   Narrative: 33-year-old African-American female originally from [place] moved here a year ago or so Has been trying to do a juice cleanse in addition to trial of weight loss recently Has a history of HLD and borderline diabetes not on meds  last followed with Dr. Jordan in 2018 and was supposed to see her 01/2018  Presented to emergency department with severe DKA And severe acidosis was given bicarb in the ED and placed on Glucomander It was felt that she might have pneumomediastinum based on plain x-ray films  She was admitted to the stepdown unit where she remained   A & Plan Severe DKA-transitioned rapidly off the same Blood gas showed resolution of acidosis Needs significant education-reasonable to consider C-peptide doubt A1c would change my management at this time would need that in 3 months Continue 8 units Levemir 3 times daily meals 3 units coverage and sliding scale and will determine long-term needs She will need to be here at least through the weekend for teaching Pneumomediastinum secondary to excessive vomiting Will obtain CT scan of chest rule out worsening pneumomediastinum as she is still tachypneic tachycardic AKI on admission Hypokalemia severe Now resolving, give half normal saline + K, repeat labs 1500 A.m. labs including magnesium Leukocytosis Stress response likely secondary to pneumomediastinum etc. Likely reflux secondary to profuse vomiting from DKA Start PPI IV consider Carafate if able to take p.o. BMI 33 Requires medically supervised weight loss-CC Dr. Jordan  Lovenox, full code, stepdown, inpatient   Samtani, MD  [facility] Via amion app OR [email or web address] 7PM-7AM contact night coverage as above 08/12/2018, 8:27 AM  LOS: 1 day   Consultants:  None  Procedures:  No  Antimicrobials:  No  Interval  history/Subjective:  Awake Alert but in distress this morning when I saw her, main complaint was abdominal and chest pain No fever no chills Cannot really keep down much Looks a little tachypneic Neurologically intact no focal deficit   Objctive:  Vitals:  Vitals:   08/12/18 0600 08/12/18 0700  BP: (!) 143/85 134/88  Pulse:    Resp: 19 18  Temp:    SpO2:  98%    Exam:  Sleepy slightly tachypneic no distress Monitors heart rate 1 10-1 20 sinus tach Abdomen soft however tenderness in epigastrium and upper chest No blurred vision no double vision No lower extremity edema    I have personally reviewed the following:  DATA   Labs:  K2.5 BUN/creatinine down to 11/1.0 magnesium 1.9 White count 15 hemoglobin 12 MCV 80 platelet 203  Imaging studies:  CT chest pending   Scheduled Meds: . Chlorhexidine Gluconate Cloth  6 each Topical Daily  . enoxaparin (LOVENOX) injection  40 mg Subcutaneous Q24H  . insulin aspart  0-9 Units Subcutaneous TID WC  . insulin aspart  3 Units Subcutaneous TID WC  . insulin detemir  8 Units Subcutaneous Daily  . insulin starter kit- pen needles  1 kit Other Once  . pantoprazole (PROTONIX) IV  40 mg Intravenous Q12H   Continuous Infusions: . sodium chloride 0.45 % 1,000 mL with potassium chloride 40 mEq infusion 125 mL/hr at 08/12/18 1400    Active Problems:   Secondary DM with DKA, uncontrolled (HCC)   LOS: 1 day          

## 2018-08-12 NOTE — Progress Notes (Signed)
K 2.5 given to Dr. Mahala Menghini. Md will place orders.

## 2018-08-13 ENCOUNTER — Encounter (HOSPITAL_COMMUNITY): Payer: Self-pay

## 2018-08-13 LAB — GLUCOSE, CAPILLARY
Glucose-Capillary: 368 mg/dL — ABNORMAL HIGH (ref 70–99)
Glucose-Capillary: 386 mg/dL — ABNORMAL HIGH (ref 70–99)
Glucose-Capillary: 216 mg/dL — ABNORMAL HIGH (ref 70–99)
Glucose-Capillary: 206 mg/dL — ABNORMAL HIGH (ref 70–99)

## 2018-08-13 LAB — CBC WITH DIFFERENTIAL/PLATELET
Abs Immature Granulocytes: 0.05 10*3/uL (ref 0.00–0.07)
Basophils Absolute: 0 10*3/uL (ref 0.0–0.1)
Basophils Relative: 0 %
Eosinophils Absolute: 0 10*3/uL (ref 0.0–0.5)
Eosinophils Relative: 0 %
HCT: 33.2 % — ABNORMAL LOW (ref 36.0–46.0)
Hemoglobin: 11.2 g/dL — ABNORMAL LOW (ref 12.0–15.0)
Immature Granulocytes: 0 %
Lymphocytes Relative: 23 %
Lymphs Abs: 2.8 10*3/uL (ref 0.7–4.0)
MCH: 27.5 pg (ref 26.0–34.0)
MCHC: 33.7 g/dL (ref 30.0–36.0)
MCV: 81.4 fL (ref 80.0–100.0)
Monocytes Absolute: 1.4 10*3/uL — ABNORMAL HIGH (ref 0.1–1.0)
Monocytes Relative: 11 %
Neutro Abs: 8.1 10*3/uL — ABNORMAL HIGH (ref 1.7–7.7)
Neutrophils Relative %: 66 %
Platelets: 193 10*3/uL (ref 150–400)
RBC: 4.08 MIL/uL (ref 3.87–5.11)
RDW: 13.8 % (ref 11.5–15.5)
WBC: 12.3 10*3/uL — ABNORMAL HIGH (ref 4.0–10.5)
nRBC: 0.2 % (ref 0.0–0.2)

## 2018-08-13 LAB — BASIC METABOLIC PANEL
Anion gap: 17 — ABNORMAL HIGH (ref 5–15)
BUN: 11 mg/dL (ref 6–20)
CO2: 12 mmol/L — ABNORMAL LOW (ref 22–32)
Calcium: 8.4 mg/dL — ABNORMAL LOW (ref 8.9–10.3)
Chloride: 111 mmol/L (ref 98–111)
Creatinine, Ser: 1.09 mg/dL — ABNORMAL HIGH (ref 0.44–1.00)
GFR calc Af Amer: >60 mL/min (ref >60)
GFR calc non Af Amer: >60 mL/min (ref >60)
Glucose, Bld: 277 mg/dL — ABNORMAL HIGH (ref 70–99)
Potassium: 3.2 mmol/L — ABNORMAL LOW (ref 3.5–5.1)
Sodium: 140 mmol/L (ref 135–145)

## 2018-08-13 LAB — MAGNESIUM: Magnesium: 2.2 mg/dL (ref 1.7–2.4)

## 2018-08-13 MED ORDER — LEVONORGESTREL-ETHINYL ESTRAD 0.15-30 MG-MCG PO TABS: 1.0000 | ORAL_TABLET | Freq: Every day | ORAL | Status: DC

## 2018-08-13 MED ORDER — INSULIN DETEMIR 100 UNIT/ML ~~LOC~~ SOLN
8.0000 [IU] | Freq: Two times a day (BID) | SUBCUTANEOUS | Status: DC
  Administered 2018-08-13: 8 [IU] via SUBCUTANEOUS
  Filled 2018-08-13 (×3): qty 0.08

## 2018-08-13 MED ORDER — CARVEDILOL 6.25 MG PO TABS
6.2500 mg | ORAL_TABLET | Freq: Two times a day (BID) | ORAL | Status: DC
  Administered 2018-08-13 – 2018-08-15 (×5): 6.25 mg via ORAL
  Filled 2018-08-13 (×6): qty 1

## 2018-08-13 MED ORDER — METFORMIN HCL 500 MG PO TABS
500.0000 mg | ORAL_TABLET | Freq: Every day | ORAL | Status: DC
  Administered 2018-08-13 – 2018-08-15 (×3): 500 mg via ORAL
  Filled 2018-08-13 (×4): qty 1

## 2018-08-13 MED ORDER — PANTOPRAZOLE SODIUM 40 MG PO TBEC
40.0000 mg | DELAYED_RELEASE_TABLET | Freq: Every day | ORAL | Status: DC
  Administered 2018-08-13 – 2018-08-15 (×3): 40 mg via ORAL
  Filled 2018-08-13 (×4): qty 1

## 2018-08-13 MED ORDER — POTASSIUM CHLORIDE CRYS ER 20 MEQ PO TBCR
40.0000 meq | EXTENDED_RELEASE_TABLET | Freq: Two times a day (BID) | ORAL | Status: DC
  Administered 2018-08-13 – 2018-08-15 (×5): 40 meq via ORAL
  Filled 2018-08-13 (×5): qty 2

## 2018-08-13 MED ORDER — ACETAMINOPHEN 500 MG PO TABS
500.0000 mg | ORAL_TABLET | Freq: Four times a day (QID) | ORAL | Status: DC | PRN
  Administered 2018-08-13: 500 mg via ORAL
  Filled 2018-08-13: qty 1

## 2018-08-13 MED ORDER — SUCRALFATE 1 G PO TABS
1.0000 g | ORAL_TABLET | Freq: Three times a day (TID) | ORAL | Status: DC
  Administered 2018-08-13 – 2018-08-15 (×9): 1 g via ORAL
  Filled 2018-08-13 (×9): qty 1

## 2018-08-13 NOTE — Progress Notes (Signed)
Inpatient Diabetes Program Recommendations  AACE/ADA: New Consensus Statement on Inpatient Glycemic Control (2015)  Target Ranges:  Prepandial:   less than 140 mg/dL      Peak postprandial:   less than 180 mg/dL (1-2 hours)      Critically ill patients:  140 - 180 mg/dL   Lab Results  Component Value Date   GLUCAP 368 (H) 08/13/2018   HGBA1C 6.5 01/12/2017    Review of Glycemic Control Results for Kathleen [name], DESHMUKH (MRN [id number]) as of 08/13/2018 11:31  Ref. Range 08/12/2018 11:19 08/12/2018 15:52 08/12/2018 21:25 08/13/2018 07:51  Glucose-Capillary Latest Ref Range: 70 - 99 mg/dL 433 (H) 295 (H) 188 (H) 368 (H)   Diabetes history: New Dx. DM Outpatient Diabetes medications: None Current orders for Inpatient glycemic control:  Levemir 8 units daily, Novolog 3 units tid with meals, and Novolog sensitive tid with meals and HS  Inpatient Diabetes Program Recommendations:   -Increase Levemir to 8 units bid -Increase Novolog to 4 units meal coverage if eats 50%  Thank you, Darel Hong E. Parv Manthey, RN, MSN, CDE  Diabetes Coordinator Inpatient Glycemic Control Team Team Pager [phone] (8am-5pm) 08/13/2018 11:34 AM

## 2018-08-13 NOTE — Progress Notes (Signed)
Dr Mahala Menghini paged, patient would like to speak to you about Metformin side effects prior to taking.

## 2018-08-13 NOTE — Progress Notes (Signed)
Report called to receiving Rn [id number]. Patient given diabetes education book and diet education materials. Patient also given instructions on insulin administration and self administered insulin. Will transfer via WC.

## 2018-08-13 NOTE — Progress Notes (Signed)
[facility] PROGRESS NOTE  Kathleen [name] ZRA:[id number] DOB: 1985-07-03 DOA: 08/11/2018 PCP: Martinique, Betty G, MD   Narrative: 33 year old African-American female originally from [place] moved here a year ago or so Has been trying to do a juice cleanse in addition to trial of weight loss recently Has a history of HLD and borderline diabetes not on meds  last followed with Dr. Martinique in 2018 and was supposed to see her 01/2018  Presented to emergency department with severe DKA And severe acidosis was given bicarb in the ED and placed on Glucomander It was felt that she might have pneumomediastinum based on plain x-ray films  She was admitted to the stepdown unit where she remained   A & Plan Severe DKA-transitioned rapidly off the same Blood gas showed resolution of acidosis Needs significant education-has bizarre beliefs re: juicing, mother has concerns about metformin--I have given ample education for patient to read, and left long and detailed VM on mother's VM as did not reach her in person Changed 8 units Levemir to bid  3 times daily meals 3 units coverage and sliding scale and will determine long-term needs Expect continuation of Education as OP on d/c am 5/11 Pneumomediastinum secondary to excessive vomiting Ct chest concordant with CXR No furthe rwork up AKI on admission Hypokalemia severe D/c slaine Kdur 40 bid Mag corrected Leukocytosis Stress response and resolved Likely reflux secondary to profuse vomiting from DKA Start PPI IV consider Carafate if able to take p.o. BMI 33 Requires medically supervised weight loss-CC Dr. Martinique  Lovenox, full code, stepdown, inpatient   Zaina Jenkin, MD  [facility] [place] [email or web address] 7PM-7AM contact night coverage as above 08/13/2018, 4:10 PM  LOS: 2 days   Consultants:  None  Procedures:  No  Antimicrobials:  No  Interval history/Subjective:  Awake alert coherent no distress less  pain tolerating diet Eating and drinking Asked me if she can have her OCPs    Objctive:  Vitals:  Vitals:   08/13/18 1300 08/13/18 1415  BP: (!) 161/86 122/74  Pulse: (!) 106 (!) 107  Resp: 19 16  Temp:  99.1 F (37.3 C)  SpO2: 96% 99%    Exam:  Awake alert pleasant no distress EOMI NCAT Chest clear no added sound Abdomen soft nontender no rebound no guarding No lower extremity edema Visual acuity intact More awake alert than previous  I have personally reviewed the following:  DATA   Labs:  Potassium 3.2 BUN/creatinine 11/1.09 magnesium up to 2.2 white count down to 10  Imaging studies:  CT chest pending   Scheduled Meds: . carvedilol  6.25 mg Oral BID WC  . Chlorhexidine Gluconate Cloth  6 each Topical Daily  . enoxaparin (LOVENOX) injection  40 mg Subcutaneous Q24H  . insulin aspart  0-9 Units Subcutaneous TID WC  . insulin aspart  3 Units Subcutaneous TID WC  . insulin detemir  8 Units Subcutaneous BID  . insulin starter kit- pen needles  1 kit Other Once  . metFORMIN  500 mg Oral Q breakfast  . pantoprazole  40 mg Oral Daily  . potassium chloride  40 mEq Oral BID  . sucralfate  1 g Oral TID WC & HS   Continuous Infusions:   Active Problems:   Secondary DM with DKA, uncontrolled ([place])   LOS: 2 days

## 2018-08-14 LAB — HEMOGLOBIN A1C
Hgb A1c MFr Bld: 15 % — ABNORMAL HIGH (ref 4.8–5.6)
Mean Plasma Glucose: 384 mg/dL

## 2018-08-14 LAB — GLUCOSE, CAPILLARY
Glucose-Capillary: 263 mg/dL — ABNORMAL HIGH (ref 70–99)
Glucose-Capillary: 325 mg/dL — ABNORMAL HIGH (ref 70–99)
Glucose-Capillary: 293 mg/dL — ABNORMAL HIGH (ref 70–99)
Glucose-Capillary: 271 mg/dL — ABNORMAL HIGH (ref 70–99)
Glucose-Capillary: 177 mg/dL — ABNORMAL HIGH (ref 70–99)

## 2018-08-14 LAB — RENAL FUNCTION PANEL
Albumin: 3 g/dL — ABNORMAL LOW (ref 3.5–5.0)
Anion gap: 12 (ref 5–15)
BUN: 13 mg/dL (ref 6–20)
CO2: 17 mmol/L — ABNORMAL LOW (ref 22–32)
Calcium: 9 mg/dL (ref 8.9–10.3)
Chloride: 110 mmol/L (ref 98–111)
Creatinine, Ser: 0.94 mg/dL (ref 0.44–1.00)
GFR calc Af Amer: >60 mL/min (ref >60)
GFR calc non Af Amer: >60 mL/min (ref >60)
Glucose, Bld: 335 mg/dL — ABNORMAL HIGH (ref 70–99)
Phosphorus: <1 mg/dL — CL (ref 2.5–4.6)
Potassium: 3.1 mmol/L — ABNORMAL LOW (ref 3.5–5.1)
Sodium: 139 mmol/L (ref 135–145)

## 2018-08-14 LAB — MAGNESIUM: Magnesium: 2 mg/dL (ref 1.7–2.4)

## 2018-08-14 MED ORDER — INSULIN ASPART 100 UNIT/ML ~~LOC~~ SOLN: 0.0000 [IU] | Freq: Every day | SUBCUTANEOUS | Status: DC

## 2018-08-14 MED ORDER — INSULIN DETEMIR 100 UNIT/ML ~~LOC~~ SOLN
10.0000 [IU] | Freq: Once | SUBCUTANEOUS | Status: AC
  Administered 2018-08-14: 10 [IU] via SUBCUTANEOUS
  Filled 2018-08-14: qty 0.1

## 2018-08-14 MED ORDER — PHENOL 1.4 % MT LIQD
1.0000 | OROMUCOSAL | Status: DC | PRN
  Administered 2018-08-14: 1 via OROMUCOSAL
  Filled 2018-08-14: qty 177

## 2018-08-14 MED ORDER — INSULIN ASPART 100 UNIT/ML ~~LOC~~ SOLN
4.0000 [IU] | Freq: Three times a day (TID) | SUBCUTANEOUS | Status: DC
  Administered 2018-08-14 – 2018-08-15 (×4): 4 [IU] via SUBCUTANEOUS

## 2018-08-14 MED ORDER — INSULIN DETEMIR 100 UNIT/ML ~~LOC~~ SOLN
10.0000 [IU] | Freq: Two times a day (BID) | SUBCUTANEOUS | Status: DC
  Administered 2018-08-14 – 2018-08-15 (×2): 10 [IU] via SUBCUTANEOUS
  Filled 2018-08-14 (×3): qty 0.1

## 2018-08-14 MED ORDER — BENZONATATE 100 MG PO CAPS
100.0000 mg | ORAL_CAPSULE | Freq: Two times a day (BID) | ORAL | Status: DC
  Administered 2018-08-14 – 2018-08-15 (×3): 100 mg via ORAL
  Filled 2018-08-14 (×3): qty 1

## 2018-08-14 MED ORDER — POTASSIUM PHOSPHATES 15 MMOLE/5ML IV SOLN
20.0000 mmol | Freq: Once | INTRAVENOUS | Status: AC
  Administered 2018-08-14: 20 mmol via INTRAVENOUS
  Filled 2018-08-14: qty 6.67

## 2018-08-14 MED ORDER — INSULIN ASPART 100 UNIT/ML ~~LOC~~ SOLN
0.0000 [IU] | Freq: Three times a day (TID) | SUBCUTANEOUS | Status: DC
  Administered 2018-08-14 (×2): 11 [IU] via SUBCUTANEOUS
  Administered 2018-08-15 (×2): 7 [IU] via SUBCUTANEOUS

## 2018-08-14 NOTE — Discharge Instructions (Signed)
Diabetes Label Reading Tips Check the Nutrition Facts on food labels for nutrient information for the food. The Nutrition Facts information is based on a standard serving size.  However, that serving size may not be the same as the serving size used in carbohydrate counting. Always start by checking the serving size on the label. Is this the serving size you will be eating?  How many servings are in the package? Next, look at the total carbohydrate. It is measured in grams (g).  To find the number of carbohydrate servings in 1 standard serving of a food, divide the total grams of carbohydrate by 15. One (1) carbohydrate serving is the amount of food with 15 g carbohydrate. You dont need to count grams of sugars. They are included in the total carbohydrate. The label shows how many calories are in the standard serving.  It also lists the amount of fat, cholesterol, sodium, protein, and some vitamins and minerals.  Look below the line listing total fat to find out how much of that fat is saturated fat or trans fat. Choose foods that are low in these kinds of fats because they are not healthy for your heart.  In the foods that are healthiest for your heart, grams of saturated fat and trans fat are less than one-third of the total fat grams. If foods are very low in calories (less than 20 calories per serving) or carbohydrates (5 g carbohydrate or less per serving), you may not need to count them when you count carbohydrates.     Carbohydrate Counting for People with Diabetes Why Is Carbohydrate Counting Important? Counting carbohydrate servings may help you control your blood glucose level so that you feel better. The balance between the carbohydrates you eat and insulin determines what your blood glucose level will be after eating. Carbohydrate counting can also help you plan your meals.  Which Foods Have Carbohydrates? Foods with carbohydrates include:  Breads, crackers, and cereals Pasta,  rice, and grains Starchy vegetables, such as potatoes, corn, and peas Beans and legumes Milk, soy milk, and yogurt Fruits and fruit juices Sweets, such as cakes, cookies, ice cream, jam, and jelly  Carbohydrate Servings In diabetes meal planning, 1 serving of a food with carbohydrate has about 15 grams of carbohydrate: Check serving sizes with measuring cups and spoons or a food scale. Read the Nutrition Facts on food labels to find out how many grams of carbohydrate are in foods you eat. The food lists in this handout show portions that have about 15 grams of carbohydrate.  Meal Planning Tips An Eating Plan tells you how many carbohydrate servings to eat at your meals and snacks.  For many adults, eating 3-5 servings of carbohydrate foods at each meal and 1-2 carbohydrate servings for each snack works well.  In a healthy daily Eating Plan, most carbohydrates come from: At least 6 servings of fruits and nonstarchy vegetables At least 6 servings of grains, beans, and starchy vegetables, with at least 3 servings from whole grains At least 2 servings of milk or milk products  Check your blood glucose level regularly.  It can tell you if you need to adjust when you eat carbohydrates. Eating foods that have fiber, such as whole grains, and having very few salty foods is good for your health. Eat 4 to 6 ounces of meat or other protein foods (such as soybean burgers) each day.  Choose low-fat sources of protein, such as lean beef, lean pork, chicken, fish, low-fat cheese,  or vegetarian foods such as soy. Eat some healthy fats, such as olive oil, canola oil, and nuts. Eat very little saturated fats. These unhealthy fats are found in butter, cream, and high-fat meats, such as bacon and sausage. Eat very little or no trans fats. These unhealthy fats are found in all foods that list partially hydrogenated oil as an ingredient.  Label Reading Tips The Nutrition Facts panel on a label lists the  grams of total carbohydrate in 1 standard serving.  The labels standard serving may be larger or smaller than 1 carbohydrate serving. To figure out how many carbohydrate servings are in the food: First, look at the labels standard serving size. Check the grams of total carbohydrate. This is the amount of carbohydrate in 1 standard serving. Divide the grams of total carbohydrate by 15. This number equals the number of carbohydrate servings in 1 standard serving.  Remember: 1 carbohydrate serving is 15 grams of carbohydrate. Note: You may ignore the grams of sugars on the Nutrition Facts panel because they are included in the grams of total carbohydrate.  Foods Recommended 1 serving = about 15 grams of carbohydrate  Starches 1 slice bread (1 ounce) 1 tortilla (6-inch size)  large bagel (1 ounce) 2 taco shells (5-inch size)  hamburger or hot dog bun ( ounce)  cup ready-to-eat unsweetened cereal  cup cooked cereal 1 cup broth-based soup 4 to 6 small crackers 1/3 cup pasta or rice (cooked)  cup beans, peas, corn, sweet potatoes, winter squash, or mashed or boiled potatoes (cooked)  large baked potato (3 ounces)  ounce pretzels, potato chips, or tortilla chips 3 cups popcorn (popped)  Fruit 1 small fresh fruit ( to 1 cup)  cup canned or frozen fruit 2 tablespoons dried fruit (blueberries, cherries, cranberries, mixed fruit, raisins) 17 small grapes (3 ounces) 1 cup melon or berries  cup unsweetened fruit juice  Milk 1 cup fat-free or reduced-fat milk 1 cup soy milk 2/3 cup (6 ounces) nonfat yogurt sweetened with sugar-free sweetener  Sweets and Desserts 2-inch square cake (unfrosted) 2 small cookies (2/3 ounce)  cup ice cream or frozen yogurt  cup sherbet or sorbet 1 tablespoon syrup, jam, jelly, table sugar, or honey 2 tablespoons light syrup  Other Foods Count 1 cup raw vegetables or  cup cooked nonstarchy vegetables as zero (0) carbohydrate servings or  free foods. If you eat 3 or more servings at one meal, count them as 1 carbohydrate serving. Foods that have less than 20 calories in each serving also may be counted as zero carbohydrate servings or free foods. Count 1 cup of casserole or other mixed foods as 2 carbohydrate servings.  Carbohydrate Counting for People with Diabetes Sample 1-Day Menu Breakfast 1 extra-small banana (1 carbohydrate serving) 3/4 cup corn flakes (1 carbohydrate serving) 1 cup low-fat or fat-free milk (1 carbohydrate serving) 1 slice whole wheat bread (1 carbohydrate serving) 1 teaspoon margarine Lunch 2 ounces [place] slices 2 slices whole wheat bread (2 carbohydrate servings) 2 lettuce leaves 4 celery sticks 4 carrot sticks 1 medium apple (1 carbohydrate serving) 1 cup low-fat or fat-free milk (1 carbohydrate serving) Afternoon Snack  2 tablespoons raisins (1 carbohydrate serving) 3/4 ounce unsalted mini pretzels (1 carbohydrate serving) Evening Meal 3 ounces lean roast beef 1/2 large baked potato (2 carbohydrate servings) 1 tablespoon reduced-fat sour cream 1/2 cup green beans 1 cup vegetable salad 1 tablespoon light salad dressing 1 whole wheat dinner roll (1 carbohydrate serving) 1 teaspoon margarine 1 cup  melon balls (1 carbohydrate serving) Evening Snack  6 ounces low-fat sugar-free fruit yogurt (1 carbohydrate serving) 2 tablespoons unsalted nuts  Carbohydrate Counting for People with Diabetes Vegan Sample 1-Day Menu Breakfast 1 cup cooked oatmeal (2 carbohydrate servings)  cup blueberries (1 carbohydrate serving) 2 tablespoons flaxseeds 1 cup soymilk fortified with calcium and vitamin D 1 cup coffee Lunch 2 slices whole wheat bread (2 carbohydrate servings)  cup baked tofu  cup lettuce 2 slices tomato 2 slices avocado  cup baby carrots 1 orange (1 carbohydrate serving) 1 cup soymilk fortified with calcium and vitamin D Evening Meal Burrito made with: 1 6-inch corn  tortilla (1 carbohydrate serving) 1 cup refried vegetarian beans (1 carbohydrate serving)  cup chopped tomatoes  cup lettuce  cup salsa 1/3 cup brown rice (1 carbohydrate serving) 1 tablespoon olive oil for rice  cup zucchini Evening Snack 6 small whole grain crackers (1 carbohydrate serving) 2 apricots ( carbohydrate serving)  cup unsalted peanuts ( carbohydrate serving)

## 2018-08-14 NOTE — Plan of Care (Signed)

## 2018-08-14 NOTE — Progress Notes (Signed)
Inpatient Diabetes Program Recommendations  AACE/ADA: New Consensus Statement on Inpatient Glycemic Control (2015)  Target Ranges:  Prepandial:   less than 140 mg/dL      Peak postprandial:   less than 180 mg/dL (1-2 hours)      Critically ill patients:  140 - 180 mg/dL   Lab Results  Component Value Date   GLUCAP 263 (H) 08/14/2018   HGBA1C 6.5 01/12/2017    Review of Glycemic Control  Diabetes history: New-onset Outpatient Diabetes medications: None Current orders for Inpatient glycemic control: Levemir 8 units bid, Novolog 0-9 units tidwc + 3 units tidwc, metformin 500 mg QD  Inpatient Diabetes Program Recommendations:     Increase Levemir to 10 units bid Add HS correction Increase Novolog to 5 units tidwc for meal coverage insulin  Plan to speak with pt about new diagnosis of diabetes and teach insulin pen administration midday today. Communicated to RN this Coordinator will see pt midday.  Continue to follow and titrate insulin.  Thank you. Ailene Ards, RD, LDN, CDE Inpatient Diabetes Coordinator [phone]

## 2018-08-14 NOTE — Progress Notes (Signed)
Order for 8units of Levemir at 10am d/c'd and new order placed by MD for 10units Levemir two times daily, but start time not until 10pm tonight. Verbal order from Dr. Mahala Menghini to give 10units of Levemir now for 10am dose, and then continue on with 10 units two times day.

## 2018-08-14 NOTE — Progress Notes (Addendum)
[facility] PROGRESS NOTE  Kathleen [name] ZLD:[id number] DOB: 03-29-1986 DOA: 08/11/2018 PCP: Martinique, Betty G, MD   Narrative: 33 year old African-American female originally from [place] moved here a year ago or so Has been trying to do a juice cleanse in addition to trial of weight loss recently Has a history of HLD and borderline diabetes not on meds  last followed with Dr. Martinique in 2018 and was supposed to see her 01/2018  Presented to emergency department with severe DKA And severe acidosis was given bicarb in the ED and placed on Glucomander It was felt that she might have pneumomediastinum based on plain x-ray films  She was admitted to the stepdown unit where she remained   A & Plan Severe DKA-transitioned rapidly off the same 5/09 Needs significant education-has bizarre beliefs-appreciate RD input Increased levemir to 10 bid, changed insulin dosing to more aggressive management Expect OP further education Pneumomediastinum secondary to excessive vomiting cough Ct chest concordant with CXR No further work up Adding tessalon perls AKI on admission Hypokalemia severe Micronutrient def--replacing phos and mag aggressively today D/c ns Kdur 40 bid Leukocytosis Stress response and resolved Likely reflux secondary to profuse vomiting from DKA Start PPI IV consider Carafate if able to take p.o. BMI 33 Requires medically supervised weight loss-CC Dr. Martinique  Lovenox, full code, stepdown, inpatient Long d/w mothe ron phone 5/11--all q answered   Verlon Au, MD  [facility] [place] [email or web address] 7PM-7AM contact night coverage as above 08/14/2018, 11:32 AM  LOS: 3 days   Consultants:  None  Procedures:  No  Antimicrobials:  No  Interval history/Subjective:  Cough++ No sputum metformkin caused abd upset No cp    Objctive:  Vitals:  Vitals:   08/14/18 0430 08/14/18 0821  BP: 124/80 136/78  Pulse: 90 91  Resp: 20 16   Temp: 98.8 F (37.1 C) 98.1 F (36.7 C)  SpO2: 99% 99%    Exam  No SM lan Chest clear no added sound, no rales  soft nontender no rebound no guarding No lower extremity edema Visual acuity intact  I have personally reviewed the following:  DATA   Labs:  Potassium 3.1, co2 17, Phos <1, Mag 2  Imaging studies: CT chest  IMPRESSION: Pneumomediastinum, in keeping with findings of prior radiographs, without obvious causative etiology.   Scheduled Meds: . carvedilol  6.25 mg Oral BID WC  . Chlorhexidine Gluconate Cloth  6 each Topical Daily  . enoxaparin (LOVENOX) injection  40 mg Subcutaneous Q24H  . insulin aspart  0-9 Units Subcutaneous TID WC  . insulin aspart  3 Units Subcutaneous TID WC  . insulin detemir  10 Units Subcutaneous BID  . insulin starter kit- pen needles  1 kit Other Once  . levonorgestrel-ethinyl estradiol  1 tablet Oral QHS  . metFORMIN  500 mg Oral Q breakfast  . pantoprazole  40 mg Oral Daily  . potassium chloride  40 mEq Oral BID  . sucralfate  1 g Oral TID WC & HS   Continuous Infusions: . potassium PHOSPHATE IVPB (in mmol) 20 mmol (08/14/18 0831)    Active Problems:   Secondary DM with DKA, uncontrolled ([place])   LOS: 3 days

## 2018-08-14 NOTE — Progress Notes (Signed)
NUTRITION NOTE RD working remotely.   RD consulted for nutrition education regarding newly diagnosed diabetes.   Lab Results  Component Value Date   HGBA1C 6.5 01/12/2017    Will put handouts in Discharge Instructions: "Diabetes Label Reading Tips" and "Carbohydrate Counting for People with Diabetes" handouts from the Academy of Nutrition and Dietetics. Discussed different food groups and their effects on blood sugar, emphasizing carbohydrate-containing foods. Provided list of carbohydrates and recommended serving sizes of common foods.  Discussed importance of controlled and consistent carbohydrate intake throughout the day. Provided examples of ways to balance meals/snacks and encouraged intake of high-fiber, whole grain complex carbohydrates. Teach back method used.  Patient previously informed that she was pre-diabetic and was informed that losing weight and increased exercise would be beneficial. She has been doing a juice cleanse as a means to lose weight. Patient does seem to fully understand how this impacted her CBGs and likely led to current dx of severe DKA.   Per notes, DM Coordinator plans to visit patient this afternoon and MD note states plan for patient to have ongoing outpatient education regarding DM.   Expect fair compliance.  Body mass index is 38.33 kg/m. Pt meets criteria for obesity based on current BMI.  Current diet order is Carb Modified, patient is consuming approximately 75% of meals at this time. Medications reviewed and include sliding scale novolog, 3 units novolog TID, and 10 units levemir BID. Labs reviewed and today CBGs have been 263 and 325 mg/dl.   No further inpatient nutrition interventions warranted at this time. Recommend ongoing outpatient DM education/counceling. RD contact information provided. If additional nutrition issues arise, please re-consult RD.    Trenton Gammon, MS, RD, LDN, [facility] Inpatient Clinical Dietitian Pager #  [phone] After hours/weekend pager # [phone]

## 2018-08-14 NOTE — Progress Notes (Addendum)
Inpatient Diabetes Program Recommendations  AACE/ADA: New Consensus Statement on Inpatient Glycemic Control (2015)  Target Ranges:  Prepandial:   less than 140 mg/dL      Peak postprandial:   less than 180 mg/dL (1-2 hours)      Critically ill patients:  140 - 180 mg/dL   Lab Results  Component Value Date   GLUCAP 271 (H) 08/14/2018   HGBA1C 6.5 01/12/2017    Review of Glycemic Control  Educated patient on insulin pen use at home. Reviewed contents of insulin flexpen starter kit. Reviewed all steps if insulin pen including attachment of needle, 2-unit air shot, dialing up dose, giving injection, removing needle, disposal of sharps, storage of unused insulin, disposal of insulin etc. Patient able to provide successful return demonstration. Also reviewed troubleshooting with insulin pen. MD to give patient Rxs for insulin pens and insulin pen needles.  Long discussion with pt regarding all aspects of newly diagnosed DM. Pt had great questions and appears to be eager to learn how best to control his blood sugars to prevent long-term complications. Discussed diet, exercise, stress management, glucose monitoring, and importance of f/u with PCP. Stressed importance of lifestyle modification to control blood sugars.  CBGs 263, 325, 293, 271. Titrate both Levemir and Novolog.  Inpatient Diabetes Program Recommendations:     Levemir increased to 10 units bid Novolog increased to 0-20 units tidwc and hs + 4 units tidwc.  Requested pt continue to review educational materials and write down questions. Will call in am to f/u.  Thank you. Lorenda Peck, RD, LDN, CDE Inpatient Diabetes Coordinator [phone]  Addendum:  Pt prefers insulin pens. Will need prescription for pens, pen needles (#[id number]) and glucose meter kit (#[id number])

## 2018-08-15 LAB — COMPREHENSIVE METABOLIC PANEL
ALT: 21 U/L (ref 0–44)
AST: 21 U/L (ref 15–41)
Albumin: 3 g/dL — ABNORMAL LOW (ref 3.5–5.0)
Alkaline Phosphatase: 105 U/L (ref 38–126)
Anion gap: 10 (ref 5–15)
BUN: 7 mg/dL (ref 6–20)
CO2: 17 mmol/L — ABNORMAL LOW (ref 22–32)
Calcium: 8.9 mg/dL (ref 8.9–10.3)
Chloride: 110 mmol/L (ref 98–111)
Creatinine, Ser: 0.77 mg/dL (ref 0.44–1.00)
GFR calc Af Amer: >60 mL/min (ref >60)
GFR calc non Af Amer: >60 mL/min (ref >60)
Glucose, Bld: 301 mg/dL — ABNORMAL HIGH (ref 70–99)
Potassium: 3.7 mmol/L (ref 3.5–5.1)
Sodium: 137 mmol/L (ref 135–145)
Total Bilirubin: 1 mg/dL (ref 0.3–1.2)
Total Protein: 6.5 g/dL (ref 6.5–8.1)

## 2018-08-15 LAB — MAGNESIUM: Magnesium: 1.9 mg/dL (ref 1.7–2.4)

## 2018-08-15 LAB — GLUCOSE, CAPILLARY
Glucose-Capillary: 238 mg/dL — ABNORMAL HIGH (ref 70–99)
Glucose-Capillary: 247 mg/dL — ABNORMAL HIGH (ref 70–99)

## 2018-08-15 LAB — PHOSPHORUS: Phosphorus: 1.8 mg/dL — ABNORMAL LOW (ref 2.5–4.6)

## 2018-08-15 MED ORDER — CARVEDILOL 6.25 MG PO TABS: 6.2500 mg | ORAL_TABLET | Freq: Two times a day (BID) | ORAL | 1 refills | Status: DC

## 2018-08-15 MED ORDER — METFORMIN HCL 500 MG PO TABS: 500.0000 mg | ORAL_TABLET | Freq: Every day | ORAL | 0 refills | Status: DC

## 2018-08-15 MED ORDER — INSULIN ASPART FLEXPEN 100 UNIT/ML ~~LOC~~ SOPN: 6.0000 [IU] | PEN_INJECTOR | Freq: Three times a day (TID) | SUBCUTANEOUS | 2 refills | Status: DC

## 2018-08-15 MED ORDER — INSULIN GLARGINE 100 UNIT/ML SOLOSTAR PEN: 12.0000 [IU] | PEN_INJECTOR | Freq: Every day | SUBCUTANEOUS | 11 refills | Status: DC

## 2018-08-15 MED ORDER — POTASSIUM CHLORIDE CRYS ER 20 MEQ PO TBCR: 40.0000 meq | EXTENDED_RELEASE_TABLET | Freq: Two times a day (BID) | ORAL | 0 refills | Status: DC

## 2018-08-15 MED ORDER — BASAGLAR KWIKPEN 100 UNIT/ML ~~LOC~~ SOPN: 12.0000 [IU] | PEN_INJECTOR | Freq: Every day | SUBCUTANEOUS | 1 refills | Status: DC

## 2018-08-15 MED ORDER — FREESTYLE SYSTEM KIT: 1.0000 | PACK | 0 refills | Status: DC | PRN

## 2018-08-15 MED ORDER — BENZONATATE 100 MG PO CAPS: 100.0000 mg | ORAL_CAPSULE | Freq: Two times a day (BID) | ORAL | 0 refills | Status: DC

## 2018-08-15 MED ORDER — PANTOPRAZOLE SODIUM 40 MG PO TBEC: 40.0000 mg | DELAYED_RELEASE_TABLET | Freq: Every day | ORAL | 0 refills | Status: DC

## 2018-08-15 MED ORDER — BASAGLAR KWIKPEN 100 UNIT/ML ~~LOC~~ SOPN: 12.0000 [IU] | PEN_INJECTOR | Freq: Two times a day (BID) | SUBCUTANEOUS | 1 refills | Status: DC

## 2018-08-15 MED ORDER — INSULIN STARTER KIT- PEN NEEDLES (ENGLISH): 1.0000 | Freq: Once | 0 refills | Status: AC

## 2018-08-15 NOTE — Progress Notes (Signed)
Taking over care of patient agree with previous RN assessment. Denies any needs at this time will continue to monitor. 

## 2018-08-15 NOTE — Progress Notes (Signed)
Inpatient Diabetes Program Recommendations  AACE/ADA: New Consensus Statement on Inpatient Glycemic Control (2015)  Target Ranges:  Prepandial:   less than 140 mg/dL      Peak postprandial:   less than 180 mg/dL (1-2 hours)      Critically ill patients:  140 - 180 mg/dL   Lab Results  Component Value Date   GLUCAP 238 (H) 08/15/2018   HGBA1C 15.0 (H) 08/11/2018    Review of Glycemic Control  Blood sugars still above goal. Will likely d/c home today.  Inpatient Diabetes Program Recommendations:     Increase Levemir to 12 units bid Increase Novolog to 6 units tidwc  Will speak with pt via phone to answer any questions or concerns prior to discharge. Make certain pt has f/u appt with PCP after discharge.  Thank you. Ailene Ards, RD, LDN, CDE Inpatient Diabetes Coordinator [phone]

## 2018-08-15 NOTE — TOC Transition Note (Addendum)
Transition of Care [facility]) - CM/SW Discharge Note   Patient Details  Name: Kathleen [name] MRN: [id number] Date of Birth: 09-28-1985  [facility] CM/SW Contact:  Bartholome Bill, RN Phone Number: 08/15/2018, 2:01 PM   Clinical Narrative:     This CM spoke with pt for dc planning as she will be new on insulin and has Medicaid Potential listed as insurance. Per pt she does have insurance through [facility] and does have a FSA account. Pt states that her husband is bringing her new insurance card to the hospital. Pt states that she uses [facility] on [address]. Pt to contact [facility] when her husband arrives to give them her insurance info and see how much her copay will be for insulin. Pt instructed to contact this CM if has difficulty with getting copay for insulin.  Addendum:  Per pt conversation with pharmacist at [facility] is not covered by her insurance. Either Evaristo Bury or Hospital doctor are the preferred brand. MD texted information.   Sandford Craze RN,BSN [phone]

## 2018-08-15 NOTE — Progress Notes (Signed)
Went over all discharge papers with patient.  All questions answered.  VSS.

## 2018-08-15 NOTE — Discharge Summary (Addendum)
Physician Discharge Summary  Kathleen [name] PZW:[id number] DOB: 1985-07-07 DOA: 08/11/2018  PCP: Martinique, Betty G, MD  Admit date: 08/11/2018 Discharge date: 08/15/2018  Time spent: 35 minutes  Recommendations for Outpatient Follow-up:  1. Needs outpatient follow-up with Dr. Martinique and possible referral to endocrinology 2. Has many strange beliefs about foods and outpatient diabetic and nutritionist input would be integral to helping her comply with diabetic diet 3. Might benefit from outpatient referral to dermatology she states she has a lot of food allergies and has been tested in [place] in the past 4. I have prescribed both freestyle glucometer in addition to regular glucometer which ever 1 is affordable for the patient she should use 5. Recommend titration upward of metformin as an outpatient to goal if she is able to tolerate 6. New medications include insulin, Levemir, potassium, metformin  Discharge Diagnoses:  Active Problems:   Secondary DM with DKA, uncontrolled ([place])   Discharge Condition: Improved  Diet recommendation: Diabetic  Filed Weights   08/11/18 1015 08/11/18 1730  Weight: 106.6 kg 101.3 kg    History of present illness:  33 year old African-American female originally from [place] moved here a year ago or so Has been trying to do a juice cleanse in addition to trial of weight loss recently Has a history of HLD and borderline diabetes not on meds  last followed with Dr. Martinique in 2018 and was supposed to see her 01/2018  Presented to emergency department with severe DKA And severe acidosis was given bicarb in the ED and placed on Glucomander It was felt that she might have pneumomediastinum based on plain x-ray films  She was admitted to the stepdown unit where she remained for 2 days and then was transferred to the floor but still had micronutrient deficiencies which were replaced  Hospital Course:  Severe DKA-transitioned rapidly off the same  5/09 Needs significant education-has bizarre beliefs-appreciate RD input Insulin dosing adjusted during hospital stay Started on metformin Expect OP further education--has RD follow-up in the next couple of days so she can clarify some of her believes Pneumomediastinum secondary to excessive vomiting cough Ct chest concordant with CXR No further work up Adding tessalon perls-can use other medication for cough suppressant over-the-counter as long as it does not have sugar she understands AKI on admission Hypokalemia severe Micronutrient def-magnesium on discharge was 1.9 phosphorus was 1.8 D/c ns Kdur 40 bid on discharge prescribed Leukocytosis Stress response and resolved Likely reflux secondary to profuse vomiting from DKA PPI was changed to oral and Carafate discontinued BMI 33 Requires medically supervised weight loss-CC Dr. Martinique    Discharge Exam: Vitals:   08/14/18 2112 08/15/18 0511  BP: 123/79 123/76  Pulse: 89 (!) 110  Resp: 20 20  Temp: 98.4 F (36.9 C) 99 F (37.2 C)  SpO2: 99% 99%    General: Awake alert pleasant no distress EOMI NCAT rash over face does not appear butterfly pattern-also has a rash over body extensor and outer surfaces not inner surfaces of arms Slight swelling of mastoid area which looks to be superficial Cardiovascular: S1-S2 no murmur Respiratory: Clinically clear no added sound Abdomen soft no rebound no guarding  Discharge Instructions   Discharge Instructions    Diet - low sodium heart healthy   Complete by:  As directed    Discharge instructions   Complete by:  As directed    Please obtain a glucometer either the freestyle or regular and make sure that you check your sugars 4  times a day I will give you the name of the dermatology practice in town that you can follow-up with for your allergy testing Would recommend that you continue all the medications that I prescribed including your insulin as well as your blood pressure  medications Would recommend that you get lab work in about 1 week at primary care physician office to ensure that you have no side effects from any of your meds Please work hard with dietitian to safely cut back on diabetic foods and control your diet Best of luck have a happy summer   For home use only DME Glucometer   Complete by:  As directed    Increase activity slowly   Complete by:  As directed      Allergies as of 08/15/2018      Reactions   Amoxicillin Other (See Comments)   Pain in back   Apple Anaphylaxis   Corn-containing Products Anaphylaxis, Hives   Penicillins Other (See Comments)   Pain in back   Fruit & Vegetable Daily [nutritional Supplements]    Allergy to fruits- Grapes, pears, peaches, apples, plums, bananas, kiwi, melons and mangoes.    Peanut-containing Drug Products Hives   ALL TREE NUTS!   Shellfish Allergy Hives, Itching, Swelling   Wheat Hives, Itching      Medication List    TAKE these medications   Basaglar KwikPen 100 UNIT/ML Sopn Inject 0.12 mLs (12 Units total) into the skin 2 (two) times daily.   benzonatate 100 MG capsule Commonly known as:  TESSALON Take 1 capsule (100 mg total) by mouth 2 (two) times daily.   carvedilol 6.25 MG tablet Commonly known as:  COREG Take 1 tablet (6.25 mg total) by mouth 2 (two) times daily with a meal.   EPINEPHrine 0.3 mg/0.3 mL Soaj injection Commonly known as:  EpiPen Inject 0.3 mLs (0.3 mg total) into the muscle once as needed (for severe allergic reaction).   glucose monitoring kit monitoring kit 1 each by Does not apply route as needed for other.   Insulin Aspart FlexPen 100 UNIT/ML Sopn Inject 6 Units into the skin 3 (three) times daily with meals.   insulin starter kit- pen needles Misc 1 kit by Other route once for 1 dose.   levonorgestrel-ethinyl estradiol 0.15-30 MG-MCG tablet Commonly known as:  Levora 0.15/30 (28) Take 1 tablet by mouth daily.   metFORMIN 500 MG tablet Commonly known  as:  GLUCOPHAGE Take 1 tablet (500 mg total) by mouth daily with breakfast. Start taking on:  Aug 16, 2018   pantoprazole 40 MG tablet Commonly known as:  PROTONIX Take 1 tablet (40 mg total) by mouth daily. Start taking on:  Aug 16, 2018   potassium chloride SA 20 MEQ tablet Commonly known as:  K-DUR Take 2 tablets (40 mEq total) by mouth 2 (two) times daily.            Durable Medical Equipment  (From admission, onward)         Start     Ordered   08/15/18 0000  For home use only DME Glucometer     08/15/18 1250         Allergies  Allergen Reactions  . Amoxicillin Other (See Comments)    Pain in back  . Apple Anaphylaxis  . Corn-Containing Products Anaphylaxis and Hives  . Penicillins Other (See Comments)    Pain in back  . Fruit & Vegetable Daily [Nutritional Supplements]     Allergy to fruits-  Grapes, pears, peaches, apples, plums, bananas, kiwi, melons and mangoes.   . Peanut-Containing Drug Products Hives    ALL TREE NUTS!  . Shellfish Allergy Hives, Itching and Swelling  . Wheat Hives and Itching      The results of significant diagnostics from this hospitalization (including imaging, microbiology, ancillary and laboratory) are listed below for reference.    Significant Diagnostic Studies: Ct Chest Wo Contrast  Result Date: 08/12/2018 CLINICAL DATA:  Evaluate pneumomediastinum, vomiting EXAM: CT CHEST WITHOUT CONTRAST TECHNIQUE: Multidetector CT imaging of the chest was performed following the standard protocol without IV contrast. COMPARISON:  Chest radiographs, 08/11/2018 FINDINGS: Cardiovascular: No significant vascular findings. Normal heart size. No pericardial effusion. Mediastinum/Nodes: Pneumomediastinum. No enlarged mediastinal, hilar, or axillary lymph nodes. Thyroid gland, trachea, and esophagus demonstrate no significant findings. Lungs/Pleura: Mild bibasilar atelectasis. No pleural effusion or pneumothorax. Upper Abdomen: No acute abnormality.  Musculoskeletal: No chest wall mass or suspicious bone lesions identified. IMPRESSION: Pneumomediastinum, in keeping with findings of prior radiographs, without obvious causative etiology. Electronically Signed   By: Eddie Candle M.D.   On: 08/12/2018 17:22   Dg Chest Port 1 View  Result Date: 08/11/2018 CLINICAL DATA:  Check pneumomediastinum EXAM: PORTABLE CHEST 1 VIEW COMPARISON:  08/11/2018 FINDINGS: Cardiac shadows within normal limits. The lungs are hypoinflated. Previously seen pneumomediastinum changes are stable. No new focal abnormality is seen. No bony abnormality is seen. IMPRESSION: Stable changes of pneumomediastinum. Electronically Signed   By: Inez Catalina M.D.   On: 08/11/2018 19:41   Dg Chest Port 1 View  Result Date: 08/11/2018 CLINICAL DATA:  Shortness of breath EXAM: PORTABLE CHEST 1 VIEW COMPARISON:  04/13/2013 FINDINGS: Pneumomediastinum seen along the left heart border and the descending aorta. Small volume gas is seen in the neck. There is no history of vomiting and source is uncertain, presumably barotrauma. No evidence of pneumothorax. The lungs appear clear and normal volume. Normal heart size. IMPRESSION: 1. Pneumomediastinum. 2. Clear lungs. Electronically Signed   By: Monte Fantasia M.D.   On: 08/11/2018 11:52    Microbiology: Recent Results (from the past 240 hour(s))  SARS Coronavirus 2 [facility] order, Performed in [facility] hospital lab)     Status: None   Collection Time: 08/11/18 10:38 AM  Result Value Ref Range Status   SARS Coronavirus 2 NEGATIVE NEGATIVE Final    Comment: (NOTE) If result is NEGATIVE SARS-CoV-2 target nucleic acids are NOT DETECTED. The SARS-CoV-2 RNA is generally detectable in upper and lower  respiratory specimens during the acute phase of infection. The lowest  concentration of SARS-CoV-2 viral copies this assay can detect is 250  copies / mL. A negative result does not preclude SARS-CoV-2 infection  and should not be used as the  sole basis for treatment or other  patient management decisions.  A negative result may occur with  improper specimen collection / handling, submission of specimen other  than nasopharyngeal swab, presence of viral mutation(s) within the  areas targeted by this assay, and inadequate number of viral copies  (<250 copies / mL). A negative result must be combined with clinical  observations, patient history, and epidemiological information. If result is POSITIVE SARS-CoV-2 target nucleic acids are DETECTED. The SARS-CoV-2 RNA is generally detectable in upper and lower  respiratory specimens dur ing the acute phase of infection.  Positive  results are indicative of active infection with SARS-CoV-2.  Clinical  correlation with patient history and other diagnostic information is  necessary to determine patient infection status.  Positive results do  not rule out bacterial infection or co-infection with other viruses. If result is PRESUMPTIVE POSTIVE SARS-CoV-2 nucleic acids MAY BE PRESENT.   A presumptive positive result was obtained on the submitted specimen  and confirmed on repeat testing.  While 2019 novel coronavirus  (SARS-CoV-2) nucleic acids may be present in the submitted sample  additional confirmatory testing may be necessary for epidemiological  and / or clinical management purposes  to differentiate between  SARS-CoV-2 and other Sarbecovirus currently known to infect humans.  If clinically indicated additional testing with an alternate test  methodology [phone]) is advised. The SARS-CoV-2 RNA is generally  detectable in upper and lower respiratory sp ecimens during the acute  phase of infection. The expected result is Negative. Fact Sheet for Patients:  StrictlyIdeas.no Fact Sheet for Healthcare Providers: BankingDealers.co.za This test is not yet approved or cleared by the [place] FDA and has been authorized for detection  and/or diagnosis of SARS-CoV-2 by FDA under an Emergency Use Authorization (EUA).  This EUA will remain in effect (meaning this test can be used) for the duration of the COVID-19 declaration under Section 564(b)(1) of the Act, 21 U.S.C. section 360bbb-3(b)(1), unless the authorization is terminated or revoked sooner. Performed at [facility], [place] [address], [place], [place] [address]   Urine culture     Status: Abnormal   Collection Time: 08/11/18  2:56 PM  Result Value Ref Range Status   Specimen Description   Final    URINE, CLEAN CATCH Performed at [facility], [place] [address], [place], [place] [address]    Special Requests   Final    NONE Performed at [facility], [place] [address] [place], [place] [address]    Culture MULTIPLE SPECIES PRESENT, SUGGEST RECOLLECTION (A)  Final   Report Status 08/12/2018 FINAL  Final  MRSA PCR Screening     Status: None   Collection Time: 08/11/18  6:29 PM  Result Value Ref Range Status   MRSA by PCR NEGATIVE NEGATIVE Final    Comment:        The GeneXpert MRSA Assay (FDA approved for NASAL specimens only), is one component of a comprehensive MRSA colonization surveillance program. It is not intended to diagnose MRSA infection nor to guide or monitor treatment for MRSA infections. Performed at [facility], [place] [address] [place], [place] [address]      Labs: Basic Metabolic Panel: Recent Labs  Lab 08/12/18 0218 08/12/18 0640 08/12/18 1509 08/13/18 0234 08/14/18 0332 08/15/18 0913  NA 144 145 137 140 139 137  K 3.8 2.5* 3.4* 3.2* 3.1* 3.7  CL 113* 113* 110 111 110 110  CO2 16* 17* 11* 12* 17* 17*  GLUCOSE 173* 165* 377* 277* 335* 301*  BUN '11 11 13 11 13 7  '$ CREATININE 1.12* 1.06* 1.17* 1.09* 0.94 0.77  CALCIUM 8.0* 8.3* 8.3* 8.4* 9.0 8.9  MG 1.8 1.9  --  2.2 2.0 1.9  PHOS  --   --   --   --  <1.0* 1.8*   Liver Function Tests: Recent Labs  Lab  08/14/18 0332 08/15/18 0913  AST  --  21  ALT  --  21  ALKPHOS  --  105  BILITOT  --  1.0  PROT  --  6.5  ALBUMIN 3.0* 3.0*   No results for input(s): LIPASE, AMYLASE in the last 168 hours. No results for input(s): AMMONIA in the last 168 hours. CBC:  Recent Labs  Lab 08/11/18 1022 08/12/18 0335 08/13/18 0440  WBC 25.8* 15.4* 12.3*  NEUTROABS  --   --  8.1*  HGB 14.3 12.6 11.2*  HCT 47.0* 37.0 33.2*  MCV 90.2 80.4 81.4  PLT 291 203 193   Cardiac Enzymes: No results for input(s): CKTOTAL, CKMB, CKMBINDEX, TROPONINI in the last 168 hours. BNP: BNP (last 3 results) No results for input(s): BNP in the last 8760 hours.  ProBNP (last 3 results) No results for input(s): PROBNP in the last 8760 hours.  CBG: Recent Labs  Lab 08/14/18 1139 08/14/18 1620 08/14/18 2109 08/15/18 0747 08/15/18 1200  GLUCAP 293* 271* 177* 238* 247*       Signed:  Nita Sells MD   [facility] 08/15/2018, 1:54 PM

## 2018-08-16 ENCOUNTER — Ambulatory Visit (INDEPENDENT_AMBULATORY_CARE_PROVIDER_SITE_OTHER): Payer: Self-pay | Admitting: Family Medicine

## 2018-08-16 ENCOUNTER — Other Ambulatory Visit: Payer: Self-pay

## 2018-08-16 ENCOUNTER — Telehealth: Payer: Self-pay | Admitting: *Deleted

## 2018-08-16 ENCOUNTER — Encounter: Payer: Self-pay | Admitting: Family Medicine

## 2018-08-16 VITALS — BP 130/71 | HR 80 | Resp 12

## 2018-08-16 DIAGNOSIS — E876 Hypokalemia: Secondary | ICD-10-CM

## 2018-08-16 DIAGNOSIS — E1310 Other specified diabetes mellitus with ketoacidosis without coma: Secondary | ICD-10-CM

## 2018-08-16 DIAGNOSIS — K219 Gastro-esophageal reflux disease without esophagitis: Secondary | ICD-10-CM | POA: Insufficient documentation

## 2018-08-16 DIAGNOSIS — E785 Hyperlipidemia, unspecified: Secondary | ICD-10-CM

## 2018-08-16 DIAGNOSIS — Z3041 Encounter for surveillance of contraceptive pills: Secondary | ICD-10-CM

## 2018-08-16 DIAGNOSIS — Z6841 Body Mass Index (BMI) 40.0 and over, adult: Secondary | ICD-10-CM

## 2018-08-16 MED ORDER — FREESTYLE LIBRE READER DEVI: 1.0000 | 2 refills | Status: DC

## 2018-08-16 MED ORDER — FREESTYLE LIBRE SENSOR SYSTEM MISC: 1.0000 | Freq: Three times a day (TID) | 2 refills | Status: DC | PRN

## 2018-08-16 MED ORDER — LEVONORGESTREL-ETHINYL ESTRAD 0.15-30 MG-MCG PO TABS: 1.0000 | ORAL_TABLET | Freq: Every day | ORAL | 3 refills | Status: DC

## 2018-08-16 NOTE — Telephone Encounter (Signed)
Transition Care Management Follow-up Telephone Call  PCP: [place], Betty G, MD  Admit date: 08/11/2018 Discharge date: 08/15/2018  Time spent: 35 minutes  Recommendations for Outpatient Follow-up:  1. Needs outpatient follow-up with Dr. Swaz[place]and and possible referral to endocrinology 2. Has many strange beliefs about foods and outpatient diabetic and nutritionist input would be integral to helping her comply with diabetic diet 3. Might benefit from outpatient referral to dermatology she states she has a lot of food allergies and has been tested in [place] in the past 4. I have prescribed both freestyle glucometer in addition to regular glucometer which ever 1 is affordable for the patient she should use 5. Recommend titration upward of metformin as an outpatient to goal if she is able to tolerate 6. New medications include insulin, Levemir, potassium, metformin    How have you been since you were released from the hospital? Better since left the hospital    Do you understand why you were in the hospital? yes   Do you understand the discharge instructions? yes   Where were you discharged to? home   Items Reviewed:  Medications reviewed: yes  Allergies reviewed: yes  Dietary changes reviewed: yes  Referrals reviewed: yes   Functional Questionnaire:   Activities of Daily Living (ADLs):   She states they are independent in the following: mom will help States they require assistance with the following: mom   Any transportation issues/concerns?: no   Any patient concerns? yes   Confirmed importance and date/time of follow-up visits scheduled yes  Provider Appointment booked with  Dr Swaziland  Confirmed with patient if condition begins to worsen call PCP or go to the ER.  Patient was given the office number and encouraged to call back with question or concerns.  : yes

## 2018-08-16 NOTE — Assessment & Plan Note (Signed)
Educated about benefits from statin medications. For now recommend to continue working on low fat diet. Will plan on recommending statin med next OV

## 2018-08-16 NOTE — Assessment & Plan Note (Addendum)
Newly Dx with DM II. Metformin increased from 500 mg daily to 500 mg bid.Side effects discussed. No changes in Basaglar dose. Insulin Aspart to be given before or with meal. Appt with nutritionist will be arranged. No changes in current management. Eye exam and appropriate foot care. HgA1C in 11/2018.

## 2018-08-16 NOTE — Assessment & Plan Note (Signed)
Tolerating medication well. Preg test in the hospital negative, per pt report. Side effects of OCP discussed,including thrombotic events. No changes in current medications.

## 2018-08-16 NOTE — Progress Notes (Signed)
Virtual Visit via Video Note   I connected with Kathleen [name] on 08/16/18 at  8:45 AM EDT by a video enabled telemedicine application and verified that I am speaking with the correct person using two identifiers.  Location patient: home Location provider:home office Persons participating in the virtual visit: patient, provider  I discussed the limitations of evaluation and management by telemedicine and the availability of in person appointments. The patient expressed understanding and agreed to proceed.   HPI:  Kathleen [name] is a 33 yo AA female with new onset DM II,obesity,allergies, and GERD.  She was last seen on 01/04/2017.  Recently hospitalized for new onset of DM 2 complicated with DKA. Admitted on 08/11/18 and discharged on 08/15/18.  She was discharged on Metformin 500 mg daily, Basaglar 12 U bid and Insulin Aspart 6 U TID. She is taking the latter one after meals. BS's 170's to mid 200's. Checking BS's about 6 times per day.  Denies episodes of hypoglycemia.  Denies abdominal pain, nausea,vomiting, polydipsia,polyuria, or polyphagia. Requesting a glucose monitor she does not have to stick her fingers.She is requesting Care Touch blood monitor    Lab Results  Component Value Date   HGBA1C 15.0 (H) 08/11/2018   HTN,she is on Coreg 6.25 mg bid. BP readings 130's/70's. Negative for headache,visual changes,gross hematuria,decreased urine output, or edema.  HypoK+,currently she is on KCL 40 meq bid.  Lab Results  Component Value Date   CREATININE 0.77 08/15/2018   BUN 7 08/15/2018   NA 137 08/15/2018   K 3.7 08/15/2018   CL 110 08/15/2018   CO2 17 (L) 08/15/2018   HLD,she is not on statin medication. She is trying to do better with following a low fat diet.  Lab Results  Component Value Date   CHOL 158 08/12/2018   HDL 31 (L) 08/12/2018   LDLCALC NOT CALCULATED 08/12/2018   TRIG 105 08/12/2018   CHOLHDL 5.1 08/12/2018   She also mentions non productive  cough,which she aas had for a while and improved with Protonix 40 mg daily. Problem alleviated by sleeping with elevated bed head. Problem is exacerbated by food intake.States that she has a "hard time" to swallow,maily solids. She was having heartburn,alleviated by provoking emesis.  Denies fever,chills,chest pain,wheezing,or dyspnea,abdominal pain,or vomiting. She was discharged on Benzonatate. CXR 08/11/18: Pneumomediastinum.  Chest CT 08/12/18 showed pneumomediastinum w/o obvious causative etiology.    -Needs refills on OCP. She is on Levora. Before admission she missed 2 tabs back to back, noted mild vaginal spotting for a couple days. Reporting negative pregnancy test when she presented to the ER. She has tolerated medication well.   ROS: See pertinent positives and negatives per HPI.  Past Medical History:  Diagnosis Date  . Allergy   . Bronchitis     History reviewed. No pertinent surgical history.  Family History  Problem Relation Age of Onset  . Diabetes Father   . Diabetes Maternal Grandmother   . Cancer Neg Hx   . Hypertension Neg Hx   . Stroke Neg Hx     Social History   Socioeconomic History  . Marital status: Married    Spouse name: Not on file  . Number of children: Not on file  . Years of education: Not on file  . Highest education level: Not on file  Occupational History  . Not on file  Social Needs  . Financial resource strain: Not on file  . Food insecurity:    Worry: Not on file  Inability: Not on file  . Transportation needs:    Medical: Not on file    Non-medical: Not on file  Tobacco Use  . Smoking status: Never Smoker  . Smokeless tobacco: Never Used  Substance and Sexual Activity  . Alcohol use: Yes    Alcohol/week: 1.0 standard drinks    Types: 1 Standard drinks or equivalent per week  . Drug use: No  . Sexual activity: Yes    Partners: Male  Lifestyle  . Physical activity:    Days per week: Not on file    Minutes per  session: Not on file  . Stress: Not on file  Relationships  . Social connections:    Talks on phone: Not on file    Gets together: Not on file    Attends religious service: Not on file    Active member of club or organization: Not on file    Attends meetings of clubs or organizations: Not on file    Relationship status: Not on file  . Intimate partner violence:    Fear of current or ex partner: Not on file    Emotionally abused: Not on file    Physically abused: Not on file    Forced sexual activity: Not on file  Other Topics Concern  . Not on file  Social History Narrative  . Not on file      Current Outpatient Medications:  .  benzonatate (TESSALON) 100 MG capsule, Take 1 capsule (100 mg total) by mouth 2 (two) times daily., Disp: 20 capsule, Rfl: 0 .  carvedilol (COREG) 6.25 MG tablet, Take 1 tablet (6.25 mg total) by mouth 2 (two) times daily with a meal., Disp: 60 tablet, Rfl: 1 .  Continuous Blood Gluc Receiver (FREESTYLE LIBRE READER) DEVI, 1 Device by Does not apply route as directed., Disp: 6 Device, Rfl: 2 .  Continuous Blood Gluc Sensor (FREESTYLE LIBRE SENSOR SYSTEM) MISC, 1 Device by Does not apply route 3 (three) times daily as needed., Disp: 6 each, Rfl: 2 .  EPINEPHrine (EPIPEN) 0.3 mg/0.3 mL IJ SOAJ injection, Inject 0.3 mLs (0.3 mg total) into the muscle once as needed (for severe allergic reaction)., Disp: 1 Device, Rfl: 1 .  glucose monitoring kit (FREESTYLE) monitoring kit, 1 each by Does not apply route as needed for other., Disp: 1 each, Rfl: 0 .  Insulin Aspart FlexPen 100 UNIT/ML SOPN, Inject 6 Units into the skin 3 (three) times daily with meals., Disp: 1 pen, Rfl: 2 .  Insulin Glargine (BASAGLAR KWIKPEN) 100 UNIT/ML SOPN, Inject 0.12 mLs (12 Units total) into the skin 2 (two) times daily., Disp: 1 pen, Rfl: 1 .  levonorgestrel-ethinyl estradiol (LEVORA 0.15/30, 28,) 0.15-30 MG-MCG tablet, Take 1 tablet by mouth daily., Disp: 3 Package, Rfl: 3 .  metFORMIN  (GLUCOPHAGE) 500 MG tablet, Take 1 tablet (500 mg total) by mouth daily with breakfast., Disp: 30 tablet, Rfl: 0 .  pantoprazole (PROTONIX) 40 MG tablet, Take 1 tablet (40 mg total) by mouth daily., Disp: 30 tablet, Rfl: 0 .  potassium chloride SA (K-DUR) 20 MEQ tablet, Take 2 tablets (40 mEq total) by mouth 2 (two) times daily., Disp: 20 tablet, Rfl: 0  EXAM:  VITALS per patient if applicable:BP 627/03   Pulse 80   Resp 12   LMP 07/27/2018   GENERAL: alert, oriented, appears well and in no acute distress  HEENT: atraumatic, conjunctiva clear, no obvious facial abnormalities on inspection.  NECK: normal movements of the head and neck  LUNGS: on inspection no signs of respiratory distress, breathing rate appears normal, no obvious gross SOB, gasping or wheezing.Occasional non productive cough during visit.  CV: no obvious cyanosis  Kathleen: moves all visible extremities without noticeable abnormality  PSYCH/NEURO: pleasant and cooperative, no obvious depression or anxiety, speech and thought processing grossly intact  ASSESSMENT AND PLAN:  Discussed the following assessment and plan: Orders Placed This Encounter  Procedures  . Basic metabolic panel  . Fructosamine  . Amb Referral to Nutrition and Diabetic E     Hypokalemia Continue KCL 40 mg bid x 7 days. BMP in 14 days. Instructed about warning signs. Gastroesophageal reflux disease, esophagitis presence not specified  Secondary DM with DKA, uncontrolled ([place]) Newly Dx with DM II. Metformin increased from 500 mg daily to 500 mg bid.Side effects discussed. No changes in Basaglar dose. Insulin Aspart to be given before or with meal. Appt with nutritionist will be arranged. No changes in current management. Eye exam and appropriate foot care. HgA1C in 11/2018.  Dyslipidemia Educated about benefits from statin medications. For now recommend to continue working on low fat diet. Will plan on recommending statin med next  OV  Morbid obesity with BMI of 40.0-44.9, adult ([place]) We discussed benefits of wt loss as well as adverse effects of obesity. Consistency with healthy diet and physical activity recommended.    Contraception management Tolerating medication well. Preg test in the hospital negative, per pt report. Side effects of OCP discussed,including thrombotic events. No changes in current medications.  GERD (gastroesophageal reflux disease) Symptoms improved with Protonix 40 mg ,so continue taking medication daily. GERD precautions also recommended. F/U in 3-4 months.     I discussed the assessment and treatment plan with the patient. She was provided an opportunity to ask questions and all were answered. The patient agreed with the plan and demonstrated an understanding of the instructions.     Return in about 3 months (around 11/16/2018) for DM II,Wt,HLD,HTN.    Jeananne Bedwell [place], MD

## 2018-08-16 NOTE — Assessment & Plan Note (Signed)
We discussed benefits of wt loss as well as adverse effects of obesity. Consistency with healthy diet and physical activity recommended.  

## 2018-08-16 NOTE — Assessment & Plan Note (Signed)
Symptoms improved with Protonix 40 mg ,so continue taking medication daily. GERD precautions also recommended. F/U in 3-4 months.

## 2018-08-18 ENCOUNTER — Ambulatory Visit: Payer: Self-pay | Admitting: Family Medicine

## 2018-08-22 ENCOUNTER — Telehealth: Payer: Self-pay | Admitting: *Deleted

## 2018-08-22 NOTE — Telephone Encounter (Signed)
Copied from CRM [id number]. Topic: General - Other >> Aug 22, 2018  1:46 PM Elliot Gault wrote: Relation to pt: self Call back number: [phone]    Reason for call:  Patient requesting Dr.Note to [facility], [place] stating  due to patient health or anyone health breathing in noxious fumes is harmful to patient health, please advise

## 2018-08-22 NOTE — Telephone Encounter (Signed)
What is the purpose of the letter? What type of fumes? Which health issue is this affecting? If company works with "fumes" they should provide respiratory protection.  Thanks, BJ

## 2018-08-22 NOTE — Telephone Encounter (Signed)
Message sent to Dr. Jordan for review and approval. 

## 2018-08-22 NOTE — Telephone Encounter (Signed)
Pt stated there are noxious electrical fumes and she needs the letter faxed to [phone] in attention to [facility].

## 2018-08-23 ENCOUNTER — Encounter: Payer: Self-pay | Admitting: *Deleted

## 2018-08-23 ENCOUNTER — Other Ambulatory Visit: Payer: Self-pay

## 2018-08-23 ENCOUNTER — Ambulatory Visit (INDEPENDENT_AMBULATORY_CARE_PROVIDER_SITE_OTHER): Payer: Self-pay | Admitting: Family Medicine

## 2018-08-23 ENCOUNTER — Encounter: Payer: Self-pay | Admitting: Family Medicine

## 2018-08-23 DIAGNOSIS — R05 Cough: Secondary | ICD-10-CM

## 2018-08-23 DIAGNOSIS — J45909 Unspecified asthma, uncomplicated: Secondary | ICD-10-CM | POA: Insufficient documentation

## 2018-08-23 DIAGNOSIS — R059 Cough, unspecified: Secondary | ICD-10-CM

## 2018-08-23 DIAGNOSIS — J4520 Mild intermittent asthma, uncomplicated: Secondary | ICD-10-CM

## 2018-08-23 MED ORDER — ALBUTEROL SULFATE HFA 108 (90 BASE) MCG/ACT IN AERS: 2.0000 | INHALATION_SPRAY | Freq: Four times a day (QID) | RESPIRATORY_TRACT | 1 refills | Status: DC | PRN

## 2018-08-23 MED ORDER — FLUTICASONE PROPIONATE HFA 110 MCG/ACT IN AERO: 2.0000 | INHALATION_SPRAY | Freq: Two times a day (BID) | RESPIRATORY_TRACT | 3 refills | Status: DC

## 2018-08-23 NOTE — Progress Notes (Signed)
Virtual Visit via Video Note   I connected with Kathleen [name] on 08/23/18 at 10:00 AM EDT by a video enabled telemedicine application and verified that I am speaking with the correct person using two identifiers.  Location patient: home Location provider:home office Persons participating in the virtual visit: patient, provider  I discussed the limitations of evaluation and management by telemedicine and the availability of in person appointments. The patient expressed understanding and agreed to proceed.   HPI:  Kathleen [name] is a 33 yo AA female with Hx of DM II,obesity,and multiple allergies who is requesting a letter saying that she cannot be expose to "fumes" because it is affecting her health.  She is reporting history of coughing and wheezing since childhood, "history of bronchitis", she has had inhalers in the past. According to pt,symptoms are exacerbated by an smell in her apartment, "nausious electric fume",worse when she turns stove on,so she thinks it is coming from her stove. States that apartment company already checked and no odor or problem with stove found.  She left the apartment x 2 days and felt greatly better,symptoms started back when she came back to her apartment.  She denies associated fever, chills, change in appetite, or unusual fatigue. Nausea but no vomiting, exacerbated by unspecific odor in her apartment. She denies abdominal pain, changes in bowel habits, or edema.  She also has Hx of GERD but denies having symptoms since she started Protonix.   ROS: See pertinent positives and negatives per HPI.  Past Medical History:  Diagnosis Date  . Allergy   . Bronchitis     No past surgical history on file.  Family History  Problem Relation Age of Onset  . Diabetes Father   . Diabetes Maternal Grandmother   . Cancer Neg Hx   . Hypertension Neg Hx   . Stroke Neg Hx     Social History   Socioeconomic History  . Marital status: Married    Spouse name:  Not on file  . Number of children: Not on file  . Years of education: Not on file  . Highest education level: Not on file  Occupational History  . Not on file  Social Needs  . Financial resource strain: Not on file  . Food insecurity:    Worry: Not on file    Inability: Not on file  . Transportation needs:    Medical: Not on file    Non-medical: Not on file  Tobacco Use  . Smoking status: Never Smoker  . Smokeless tobacco: Never Used  Substance and Sexual Activity  . Alcohol use: Yes    Alcohol/week: 1.0 standard drinks    Types: 1 Standard drinks or equivalent per week  . Drug use: No  . Sexual activity: Yes    Partners: Male  Lifestyle  . Physical activity:    Days per week: Not on file    Minutes per session: Not on file  . Stress: Not on file  Relationships  . Social connections:    Talks on phone: Not on file    Gets together: Not on file    Attends religious service: Not on file    Active member of club or organization: Not on file    Attends meetings of clubs or organizations: Not on file    Relationship status: Not on file  . Intimate partner violence:    Fear of current or ex partner: Not on file    Emotionally abused: Not on file  Physically abused: Not on file    Forced sexual activity: Not on file  Other Topics Concern  . Not on file  Social History Narrative  . Not on file    Current Outpatient Medications:  .  albuterol (VENTOLIN HFA) 108 (90 Base) MCG/ACT inhaler, Inhale 2 puffs into the lungs every 6 (six) hours as needed for wheezing or shortness of breath., Disp: 1 Inhaler, Rfl: 1 .  benzonatate (TESSALON) 100 MG capsule, Take 1 capsule (100 mg total) by mouth 2 (two) times daily., Disp: 20 capsule, Rfl: 0 .  carvedilol (COREG) 6.25 MG tablet, Take 1 tablet (6.25 mg total) by mouth 2 (two) times daily with a meal., Disp: 60 tablet, Rfl: 1 .  Continuous Blood Gluc Receiver (FREESTYLE LIBRE READER) DEVI, 1 Device by Does not apply route as  directed., Disp: 6 Device, Rfl: 2 .  Continuous Blood Gluc Sensor (FREESTYLE LIBRE SENSOR SYSTEM) MISC, 1 Device by Does not apply route 3 (three) times daily as needed., Disp: 6 each, Rfl: 2 .  EPINEPHrine (EPIPEN) 0.3 mg/0.3 mL IJ SOAJ injection, Inject 0.3 mLs (0.3 mg total) into the muscle once as needed (for severe allergic reaction)., Disp: 1 Device, Rfl: 1 .  fluticasone (FLOVENT HFA) 110 MCG/ACT inhaler, Inhale 2 puffs into the lungs 2 (two) times a day., Disp: 1 Inhaler, Rfl: 3 .  glucose monitoring kit (FREESTYLE) monitoring kit, 1 each by Does not apply route as needed for other., Disp: 1 each, Rfl: 0 .  Insulin Aspart FlexPen 100 UNIT/ML SOPN, Inject 6 Units into the skin 3 (three) times daily with meals., Disp: 1 pen, Rfl: 2 .  Insulin Glargine (BASAGLAR KWIKPEN) 100 UNIT/ML SOPN, Inject 0.12 mLs (12 Units total) into the skin 2 (two) times daily., Disp: 1 pen, Rfl: 1 .  levonorgestrel-ethinyl estradiol (LEVORA 0.15/30, 28,) 0.15-30 MG-MCG tablet, Take 1 tablet by mouth daily., Disp: 3 Package, Rfl: 3 .  metFORMIN (GLUCOPHAGE) 500 MG tablet, Take 1 tablet (500 mg total) by mouth daily with breakfast., Disp: 30 tablet, Rfl: 0 .  pantoprazole (PROTONIX) 40 MG tablet, Take 1 tablet (40 mg total) by mouth daily., Disp: 30 tablet, Rfl: 0 .  potassium chloride SA (K-DUR) 20 MEQ tablet, Take 2 tablets (40 mEq total) by mouth 2 (two) times daily., Disp: 20 tablet, Rfl: 0  EXAM:  VITALS per patient if applicable:N/A  GENERAL: alert, oriented, appears well and in no acute distress  HEENT: atraumatic, conjunctiva clear, no obvious facial abnormalities on inspection. Mild dysphonia,no stridor appreciated.  NECK: normal movements of the head and neck  LUNGS: on inspection no signs of respiratory distress, breathing rate appears normal, no obvious gross SOB, gasping or wheezing  CV: no obvious cyanosis  Kathleen: moves all visible extremities without noticeable abnormality  PSYCH/NEURO:  pleasant and cooperative, no obvious depression,she is anxious; speech and thought processing grossly intact  ASSESSMENT AND PLAN:  Discussed the following assessment and plan:  Cough We discussed possible etiologies, including GERD, asthma, allergies among some. GERD symptoms seem to be well controlled with Protonix 40 mg. GERD precautions recommended. We will treat asthma and will continue to monitor for changes.  Reactive airway disease with wheezing Letter will be provide as requested. Albuterol inh 2 puff every 6 hours for a week then as needed for wheezing or shortness of breath.  Flovent 110 mcg 2 puff bid started. Instructed about warning signs. F/U in 3-4 weeks.     I discussed the assessment and treatment plan with  the patient. She was provided an opportunity to ask questions and all were answered. The patient agreed with the plan and demonstrated an understanding of the instructions.   The patient was advised to call back or seek an in-person evaluation if the symptoms worsen or if the condition fails to improve as anticipated.  Return in about 4 weeks (around 09/20/2018) for asthma.    Miley Blanchett [place], MD

## 2018-08-23 NOTE — Telephone Encounter (Signed)
Left message for patient to call the office to schedule a virtual visit today with Dr. [place] to discuss need for letter.

## 2018-08-23 NOTE — Telephone Encounter (Signed)
So she needs a letter stating something like this: Kathleen [name] is a pt of mine,who is reporting smelling a "nauseous electric fumes" in her apartment,worse when stove is on, causing cough,wheezing,and chest tightness. She thinks "fumes" are coming from her stove. She was out of her apartment for 2 days and symptoms greatly improved but recurred once she was back.  She has hx of reactive airway disease,asthma,and exposure to fumes can certainly aggravate respiratory symptoms,so avoiding known trigger factors is a very important part of the treatment.  Please consider investigating this issue and make the appropriate changes accordingly.  She wants letter fax [phone]) Thanks, BJ

## 2018-08-23 NOTE — Assessment & Plan Note (Signed)
Letter will be provide as requested. Albuterol inh 2 puff every 6 hours for a week then as needed for wheezing or shortness of breath.  Flovent 110 mcg 2 puff bid started. Instructed about warning signs. F/U in 3-4 weeks.

## 2018-08-23 NOTE — Telephone Encounter (Signed)
Letter completed and faxed as requested.

## 2018-08-23 NOTE — Telephone Encounter (Signed)
Patient stated that apartment complex refuse to change out stove due to fumes. Patient stated that she was just released from hospital due to chest tightness and the fact that she was told that she ruptured something in chest due the coughing. Patient stated that even after the stove is turned off, you can still smell the fumes and her and her grandfather have to leave the apartment because the fumes are so bad and she starts to cough really bad again.

## 2018-08-23 NOTE — Telephone Encounter (Signed)
Patient scheduled a virtual visit at 10 am to discuss concerns with PCP.

## 2018-08-30 ENCOUNTER — Telehealth: Payer: Self-pay | Admitting: *Deleted

## 2018-08-30 ENCOUNTER — Other Ambulatory Visit: Payer: Self-pay

## 2018-08-30 ENCOUNTER — Encounter: Payer: Self-pay | Admitting: Family Medicine

## 2018-08-30 ENCOUNTER — Ambulatory Visit (INDEPENDENT_AMBULATORY_CARE_PROVIDER_SITE_OTHER): Payer: Self-pay | Admitting: Family Medicine

## 2018-08-30 ENCOUNTER — Ambulatory Visit: Payer: Self-pay | Admitting: *Deleted

## 2018-08-30 DIAGNOSIS — R059 Cough, unspecified: Secondary | ICD-10-CM

## 2018-08-30 DIAGNOSIS — I10 Essential (primary) hypertension: Secondary | ICD-10-CM

## 2018-08-30 DIAGNOSIS — Z794 Long term (current) use of insulin: Secondary | ICD-10-CM

## 2018-08-30 DIAGNOSIS — J4520 Mild intermittent asthma, uncomplicated: Secondary | ICD-10-CM

## 2018-08-30 DIAGNOSIS — E119 Type 2 diabetes mellitus without complications: Secondary | ICD-10-CM

## 2018-08-30 DIAGNOSIS — K219 Gastro-esophageal reflux disease without esophagitis: Secondary | ICD-10-CM

## 2018-08-30 DIAGNOSIS — R05 Cough: Secondary | ICD-10-CM

## 2018-08-30 MED ORDER — PANTOPRAZOLE SODIUM 20 MG PO TBEC: 20.0000 mg | DELAYED_RELEASE_TABLET | Freq: Two times a day (BID) | ORAL | 2 refills | Status: DC

## 2018-08-30 MED ORDER — AMLODIPINE BESYLATE 2.5 MG PO TABS: 2.5000 mg | ORAL_TABLET | Freq: Every day | ORAL | 0 refills | Status: DC

## 2018-08-30 MED ORDER — BASAGLAR KWIKPEN 100 UNIT/ML ~~LOC~~ SOPN: 10.0000 [IU] | PEN_INJECTOR | Freq: Two times a day (BID) | SUBCUTANEOUS | 1 refills | Status: DC

## 2018-08-30 MED ORDER — METFORMIN HCL 500 MG PO TABS: 500.0000 mg | ORAL_TABLET | Freq: Every day | ORAL | 0 refills | Status: DC

## 2018-08-30 NOTE — Assessment & Plan Note (Signed)
Could be contributing to cough. Changed from Protonix 40 mg to 20 mg bid,30 min before meals. GERD precautions discussed. F/U in 3-4 weeks.

## 2018-08-30 NOTE — Telephone Encounter (Signed)
Patient scheduled visit for 08/30/2018.  Copied from CRM [id number]. Topic: General - Other >> Aug 29, 2018 12:48 PM Tamela Oddi wrote: Reason for CRM: Patient's husband called to request refills on some of her medications.  He requests that the nurse call so that he can explain the medications needed.  CB# [phone]

## 2018-08-30 NOTE — Progress Notes (Signed)
Virtual Visit via Video Note   I connected with Kathleen Kathleen [name] on 08/31/18 at 11:45 AM EDT by a video enabled telemedicine application and verified that I am speaking with the correct person using two identifiers.  Location patient: home Location provider:home office Persons participating in the virtual visit: patient, provider  I discussed the limitations of evaluation and management by telemedicine and the availability of in person appointments. The patient expressed understanding and agreed to proceed.   HPI: Kathleen [name] is a 33 yo with Hx of DM II,obesity,allergies,and GERD concerned about persistent cough. She has had non productive cough exacerbated by fumes, no other exacerbating factors identified. She was last seen on 08/23/18,when we discussed similar concern, recommend Albuterol inh and Flovent. She feels like she needs to clear her throat frequently,post nasal drainage. Negative for fever,chills,dyspnea,and not longer having wheezing.  Requesting Rx for Benzonatate,which has helped with cough.  Cough has been present since hospitalization,08/11/18. She has reported Hx of asthma. Flovent discontinued because she did not feel like it helped.  She is on Protonix 40 mg daily,she is taking it with food. In the past she was on Prilosec but it caused diarrhea. Coughing s[ells caused nausea. Cough is not related with food intake. Negative for heartburn. + Bloating sensation and gas.  Denies abdominal pain,vomiting, changes in bowel habits, blood in stool or melena.  HTN on Coreg 6.25 mg bid. Negative for headaches,visual changes,chest pain,gross hematuria,decreased urine output,or edema. She is not checking BP regularly.  DM II,states that she has not been able to get goal BS's, having BS's in the 110's and 90's.So she is snaking fruit at bedtime. She is on Basaglar 12 U bid and Metformin 500 mg bid. Lab Results  Component Value Date   HGBA1C 15.0 (H) 08/11/2018   She has  improved her diet,eating healthier.  Chest CT 08/12/18: Pneumomediastinum, in keeping with findings of prior radiographs,without obvious causative etiology.  ROS: See pertinent positives and negatives per HPI.  Past Medical History:  Diagnosis Date  . Allergy   . Bronchitis     No past surgical history on file.  Family History  Problem Relation Age of Onset  . Diabetes Father   . Diabetes Maternal Grandmother   . Cancer Neg Hx   . Hypertension Neg Hx   . Stroke Neg Hx     Social History   Socioeconomic History  . Marital status: Married    Spouse name: Not on file  . Number of children: Not on file  . Years of education: Not on file  . Highest education level: Not on file  Occupational History  . Not on file  Social Needs  . Financial resource strain: Not on file  . Food insecurity:    Worry: Not on file    Inability: Not on file  . Transportation needs:    Medical: Not on file    Non-medical: Not on file  Tobacco Use  . Smoking status: Never Smoker  . Smokeless tobacco: Never Used  Substance and Sexual Activity  . Alcohol use: Yes    Alcohol/week: 1.0 standard drinks    Types: 1 Standard drinks or equivalent per week  . Drug use: No  . Sexual activity: Yes    Partners: Male  Lifestyle  . Physical activity:    Days per week: Not on file    Minutes per session: Not on file  . Stress: Not on file  Relationships  . Social connections:    Talks on  phone: Not on file    Gets together: Not on file    Attends religious service: Not on file    Active member of club or organization: Not on file    Attends meetings of clubs or organizations: Not on file    Relationship status: Not on file  . Intimate partner violence:    Fear of current or ex partner: Not on file    Emotionally abused: Not on file    Physically abused: Not on file    Forced sexual activity: Not on file  Other Topics Concern  . Not on file  Social History Narrative  . Not on file       Current Outpatient Medications:  .  amLODipine (NORVASC) 2.5 MG tablet, Take 1 tablet (2.5 mg total) by mouth daily., Disp: 90 tablet, Rfl: 0 .  benzonatate (TESSALON) 100 MG capsule, Take 1 capsule (100 mg total) by mouth 2 (two) times daily., Disp: 20 capsule, Rfl: 0 .  Continuous Blood Gluc Receiver (FREESTYLE LIBRE READER) DEVI, 1 Device by Does not apply route as directed., Disp: 6 Device, Rfl: 2 .  Continuous Blood Gluc Sensor (FREESTYLE LIBRE SENSOR SYSTEM) MISC, 1 Device by Does not apply route 3 (three) times daily as needed., Disp: 6 each, Rfl: 2 .  EPINEPHrine (EPIPEN) 0.3 mg/0.3 mL IJ SOAJ injection, Inject 0.3 mLs (0.3 mg total) into the muscle once as needed (for severe allergic reaction)., Disp: 1 Device, Rfl: 1 .  glucose monitoring kit (FREESTYLE) monitoring kit, 1 each by Does not apply route as needed for other., Disp: 1 each, Rfl: 0 .  Insulin Aspart FlexPen 100 UNIT/ML SOPN, Inject 6 Units into the skin 3 (three) times daily with meals., Disp: 1 pen, Rfl: 2 .  Insulin Glargine (BASAGLAR KWIKPEN) 100 UNIT/ML SOPN, Inject 0.1 mLs (10 Units total) into the skin 2 (two) times daily., Disp: 9 pen, Rfl: 1 .  Lancets (ONETOUCH ULTRASOFT) lancets, Use as instructed, Disp: 100 each, Rfl: 6 .  levonorgestrel-ethinyl estradiol (LEVORA 0.15/30, 28,) 0.15-30 MG-MCG tablet, Take 1 tablet by mouth daily., Disp: 3 Package, Rfl: 3 .  metFORMIN (GLUCOPHAGE) 500 MG tablet, Take 1 tablet (500 mg total) by mouth daily with breakfast., Disp: 90 tablet, Rfl: 0 .  pantoprazole (PROTONIX) 20 MG tablet, Take 1 tablet (20 mg total) by mouth 2 (two) times daily before a meal., Disp: 60 tablet, Rfl: 2 .  potassium chloride SA (K-DUR) 20 MEQ tablet, Take 2 tablets (40 mEq total) by mouth 2 (two) times daily., Disp: 20 tablet, Rfl: 0  EXAM:  VITALS per patient if applicable:N/A  GENERAL: alert, oriented, appears well and in no acute distress  HEENT: atraumatic, conjunctiva clear, no obvious  facial abnormalities on inspection.  NECK: normal movements of the head and neck  LUNGS: on inspection no signs of respiratory distress, breathing rate appears normal, no obvious gross SOB, gasping or wheezing  CV: no obvious cyanosis  Kathleen: moves all visible extremities without noticeable abnormality  PSYCH/NEURO: pleasant and cooperative, no obvious depression,she is anxious , speech and thought processing grossly intact  ASSESSMENT AND PLAN:  Discussed the following assessment and plan:  Cough - Plan: Ambulatory referral to Pulmonology Possible causes discussed. It has been stable,so I do not think imaging is needed today. Chest CT in 08/2018 otherwise normal except for pneumomediastinum. ? GERD related vs allergies. Pulmonologist referral placed.  Type 2 diabetes mellitus without complication, with long-term current use of insulin (HCC) Based on reported BS's problem  seems improved. Basaglar decreased from 12 U bid to 10 U bid.  Benign essential hypertension Coreg to wean off. Amlodipine 2.5 mg daily added today. Recommend monitoring BP regularly. Low salt diet recommended.   GERD (gastroesophageal reflux disease) Could be contributing to cough. Changed from Protonix 40 mg to 20 mg bid,30 min before meals. GERD precautions discussed. F/U in 3-4 weeks.  Reactive airway disease with wheezing She did not tolerate Albuterol inh and did not noted improvement of cough with Flovent,so discontinued. ymptoms have iImproved but cough did not. Referral to pulmonologist placed.     I discussed the assessment and treatment plan with the patient. She was provided an opportunity to ask questions and all were answered. The patient agreed with the plan and demonstrated an understanding of the instructions.   The patient was advised to call back or seek an in-person evaluation if the symptoms worsen or if the condition fails to improve as anticipated.  Return in about 4 weeks  (around 09/27/2018) for GERD,cough,DM II.    Betty Martinique, MD

## 2018-08-30 NOTE — Assessment & Plan Note (Signed)
She did not tolerate Albuterol inh and did not noted improvement of cough with Flovent,so discontinued. ymptoms have iImproved but cough did not. Referral to pulmonologist placed.

## 2018-08-30 NOTE — Telephone Encounter (Signed)
Pt scheduled for OV today.  

## 2018-08-30 NOTE — Telephone Encounter (Signed)
Chronic productive cough since discharge from hospital. After coughing spells she notices blood with the coughing.Admitted to hospital on 5/8 with diabetic ketoacidosis.  Discharged from [facility] on 5/13. Reports no other symptoms. She is requesting additional tessalon perles to control the ongoing cough. No difficulty breathing No fever. Reports mild wheeze at times. No travels and no known contacts. Negative Covid 19.Transferring to PCP for virtual appointment. [facility] on [address].  Reason for Disposition . Coughing up rusty-colored (reddish-brown) sputum  Answer Assessment - Initial Assessment Questions 1. ONSET: "When did the cough begin?"      Since hospital admission on 5/8 2. SEVERITY: "How bad is the cough today?"      Takes her breath away, strains her throat 3. RESPIRATORY DISTRESS: "Describe your breathing."      no 4. FEVER: "Do you have a fever?" If so, ask: "What is your temperature, how was it measured, and when did it start?"     none 5. SPUTUM: "Describe the color of your sputum" (clear, white, yellow, green)     Thick, pinkish color and yellowish 6. HEMOPTYSIS: "Are you coughing up any blood?" If so ask: "How much?" (flecks, streaks, tablespoons, etc.)     streaks 7. CARDIAC HISTORY: "Do you have any history of heart disease?" (e.g., heart attack, congestive heart failure)      no 8. LUNG HISTORY: "Do you have any history of lung disease?"  (e.g., pulmonary embolus, asthma, emphysema)     no 9. PE RISK FACTORS: "Do you have a history of blood clots?" (or: recent major surgery, recent prolonged travel, bedridden)     no 10. OTHER SYMPTOMS: "Do you have any other symptoms?" (e.g., runny nose, wheezing, chest pain)      Very slight wheeze 11. PREGNANCY: "Is there any chance you are pregnant?" "When was your last menstrual period?"       no 12. TRAVEL: "Have you traveled out of the country in the last month?" (e.g., travel history, exposures)        no  Protocols used: COUGH - ACUTE PRODUCTIVE-A-AH

## 2018-08-31 MED ORDER — ONETOUCH ULTRASOFT LANCETS MISC: 6 refills | Status: DC

## 2018-09-04 ENCOUNTER — Ambulatory Visit (INDEPENDENT_AMBULATORY_CARE_PROVIDER_SITE_OTHER): Payer: Self-pay | Admitting: Family Medicine

## 2018-09-04 DIAGNOSIS — R079 Chest pain, unspecified: Secondary | ICD-10-CM

## 2018-09-04 DIAGNOSIS — R140 Abdominal distension (gaseous): Secondary | ICD-10-CM

## 2018-09-04 DIAGNOSIS — R059 Cough, unspecified: Secondary | ICD-10-CM

## 2018-09-04 DIAGNOSIS — R05 Cough: Secondary | ICD-10-CM

## 2018-09-04 MED ORDER — BENZONATATE 100 MG PO CAPS: 100.0000 mg | ORAL_CAPSULE | Freq: Two times a day (BID) | ORAL | 0 refills | Status: DC

## 2018-09-04 MED ORDER — ONETOUCH ULTRASOFT LANCETS MISC: 6 refills | Status: DC

## 2018-09-04 MED ORDER — GLUCOSE BLOOD VI STRP: ORAL_STRIP | 12 refills | Status: DC

## 2018-09-04 NOTE — Progress Notes (Signed)
Virtual Visit via Video Note   I connected with'@on'$  09/06/18 at  9:45 AM EDT by a video enabled telemedicine application and verified that I am speaking with the correct person using two identifiers.  Location patient: home Location provider:work or home office Persons participating in the virtual visit: patient, provider  I discussed the limitations of evaluation and management by telemedicine and the availability of in person appointments. The patient expressed understanding and agreed to proceed.   HPI: Ms Kathleen [name] is a 33 yo female with Hx of allergies,DM II,and RAD who is concerned about persistent cough since hospital discharge.  She has not noted fever, chills, sore throat, rhinorrhea, nasal congestion, change in the smell or taste.  Initially she thought cough was caused by fumes coming from her or when at home, letter was given an order and was already changed but cough has not improved.  She requesting prescription for benzonatate because it has helped in the past. During hospitalization COVID-19 was negative. Chest x-ray showed a stable pneumomediastinum (08/11/2018)  Chest CT on 08/12/18: Pneumomediastinum, in keeping with findings of prior radiographs,without obvious causative etiology.  Coughing is affecting his sleep. + Fatigue. Coughing seems to be worse when she goes out. She has not identified a specific exacerbating or alleviating factors. Problem is otherwise stable.  No recent sick contact or travel.  Today she denies associated dyspnea or wheezing. Chest pain upon coughing. 08/23/18 she reported mild wheezing, albuterol and Flovent were recommended, both she discontinued because they feel like they were helping with cough.  Cough has not responded to bronchodilators or PPIs. Pulmonary referral was placed last visit. Coreg was changed to amlodipine, thinking that BB may be aggravating cough,did not help. Negative for abdominal pain, nausea, vomiting, or  heartburn. + Bloating sensation and passing gas. Denies changes in bowel habits, blood in the stool, or melena. GERD,she is on Protonix 20 mg bid.  Denies changes in bowel habits, blood in the stool, or melena.  ROS: See pertinent positives and negatives per HPI.  Past Medical History:  Diagnosis Date  . Allergy   . Bronchitis     No past surgical history on file.  Family History  Problem Relation Age of Onset  . Diabetes Father   . Diabetes Maternal Grandmother   . Cancer Neg Hx   . Hypertension Neg Hx   . Stroke Neg Hx     Social History   Socioeconomic History  . Marital status: Married    Spouse name: Not on file  . Number of children: Not on file  . Years of education: Not on file  . Highest education level: Not on file  Occupational History  . Not on file  Social Needs  . Financial resource strain: Not on file  . Food insecurity:    Worry: Not on file    Inability: Not on file  . Transportation needs:    Medical: Not on file    Non-medical: Not on file  Tobacco Use  . Smoking status: Never Smoker  . Smokeless tobacco: Never Used  Substance and Sexual Activity  . Alcohol use: Yes    Alcohol/week: 1.0 standard drinks    Types: 1 Standard drinks or equivalent per week  . Drug use: No  . Sexual activity: Yes    Partners: Male  Lifestyle  . Physical activity:    Days per week: Not on file    Minutes per session: Not on file  . Stress: Not on file  Relationships  .  Social connections:    Talks on phone: Not on file    Gets together: Not on file    Attends religious service: Not on file    Active member of club or organization: Not on file    Attends meetings of clubs or organizations: Not on file    Relationship status: Not on file  . Intimate partner violence:    Fear of current or ex partner: Not on file    Emotionally abused: Not on file    Physically abused: Not on file    Forced sexual activity: Not on file  Other Topics Concern  . Not on  file  Social History Narrative  . Not on file     No current facility-administered medications for this visit.   Current Outpatient Medications:  .  amLODipine (NORVASC) 2.5 MG tablet, Take 1 tablet (2.5 mg total) by mouth daily., Disp: 90 tablet, Rfl: 0 .  benzonatate (TESSALON) 100 MG capsule, Take 1 capsule (100 mg total) by mouth 2 (two) times daily., Disp: 45 capsule, Rfl: 0 .  Continuous Blood Gluc Receiver (FREESTYLE LIBRE READER) DEVI, 1 Device by Does not apply route as directed., Disp: 6 Device, Rfl: 2 .  Continuous Blood Gluc Sensor (FREESTYLE LIBRE SENSOR SYSTEM) MISC, 1 Device by Does not apply route 3 (three) times daily as needed., Disp: 6 each, Rfl: 2 .  EPINEPHrine (EPIPEN) 0.3 mg/0.3 mL IJ SOAJ injection, Inject 0.3 mLs (0.3 mg total) into the muscle once as needed (for severe allergic reaction)., Disp: 1 Device, Rfl: 1 .  glucose blood test strip, Use as instructed, Disp: 100 each, Rfl: 12 .  glucose monitoring kit (FREESTYLE) monitoring kit, 1 each by Does not apply route as needed for other., Disp: 1 each, Rfl: 0 .  Insulin Aspart FlexPen 100 UNIT/ML SOPN, Inject 6 Units into the skin 3 (three) times daily with meals., Disp: 1 pen, Rfl: 2 .  Insulin Glargine (BASAGLAR KWIKPEN) 100 UNIT/ML SOPN, Inject 0.1 mLs (10 Units total) into the skin 2 (two) times daily., Disp: 9 pen, Rfl: 1 .  Lancets (ONETOUCH ULTRASOFT) lancets, Use as instructed, Disp: 100 each, Rfl: 6 .  levonorgestrel-ethinyl estradiol (LEVORA 0.15/30, 28,) 0.15-30 MG-MCG tablet, Take 1 tablet by mouth daily., Disp: 3 Package, Rfl: 3 .  metFORMIN (GLUCOPHAGE) 500 MG tablet, Take 1 tablet (500 mg total) by mouth daily with breakfast., Disp: 90 tablet, Rfl: 0 .  pantoprazole (PROTONIX) 20 MG tablet, Take 1 tablet (20 mg total) by mouth 2 (two) times daily before a meal., Disp: 60 tablet, Rfl: 2 .  potassium chloride SA (K-DUR) 20 MEQ tablet, Take 2 tablets (40 mEq total) by mouth 2 (two) times daily., Disp: 20  tablet, Rfl: 0  Facility-Administered Medications Ordered in Other Visits:  .  sodium chloride 0.9 % bolus 1,000 mL, 1,000 mL, Intravenous, Once, Domenic Moras, PA-C  EXAM:  VITALS per patient if applicable:N/A  GENERAL: alert, oriented, appears well and in no acute distress  HEENT: atraumatic, conjunctiva clear, no obvious abnormalities on inspection of external nose and ears  NECK: normal movements of the head and neck  LUNGS: on inspection no signs of respiratory distress, breathing rate appears normal, no obvious gross SOB, gasping or wheezing.  Coughing a few times during the visit.  CV: no obvious cyanosis  MS: moves all visible extremities without noticeable abnormality  PSYCH/NEURO: pleasant and cooperative, no obvious depression, she is anxious, speech and thought processing grossly intact  ASSESSMENT AND PLAN:  Discussed the following assessment and plan:  Chest pain, unspecified type - Plan: DG Chest 2 View Most likely musculoskeletal due to persistent cough. We will arrange plain imaging to follow on pneumomediastinum. Instructed about warning signs.  Bloated abdomen Recommend avoiding foods that can increase gas production. OTC Gas-X may help. Metformin could be aggravating problem, for now no changes but will need to consider stopping medication if problem is persistent.  Cough - Plan: DG Chest 2 View We again discussed possible etiologies, which include GERD, allergies, asthma, or medications. She has tried albuterol, Flovent, and currently on Protonix, neither one has helped with coughing. Imaging has been negative except for pneumomediastinum. We will arrange CXR. Instructed about warning signs. Pending appointment with pulmonologist.    I discussed the assessment and treatment plan with the patient. She was provided an opportunity to ask questions and all were answered. The patient agreed with the plan and demonstrated an understanding of the  instructions.   The patient was advised to call back or seek an in-person evaluation if the symptoms worsen or if the condition fails to improve as anticipated.  Return if symptoms worsen or fail to improve, for Keep appt with pulmonologist.    Alenah Sarria Martinique, MD

## 2018-09-06 ENCOUNTER — Encounter (HOSPITAL_COMMUNITY): Payer: Self-pay

## 2018-09-06 ENCOUNTER — Inpatient Hospital Stay (HOSPITAL_COMMUNITY)
Admission: EM | Admit: 2018-09-06 | Discharge: 2018-09-08 | DRG: 871 | Discharge status: Against Medical Advice | Payer: No Typology Code available for payment source | Attending: Internal Medicine | Admitting: Internal Medicine

## 2018-09-06 ENCOUNTER — Emergency Department (HOSPITAL_COMMUNITY): Payer: No Typology Code available for payment source

## 2018-09-06 ENCOUNTER — Encounter: Payer: Self-pay | Admitting: Family Medicine

## 2018-09-06 ENCOUNTER — Telehealth: Payer: Self-pay | Admitting: *Deleted

## 2018-09-06 ENCOUNTER — Other Ambulatory Visit: Payer: Self-pay

## 2018-09-06 DIAGNOSIS — Z794 Long term (current) use of insulin: Secondary | ICD-10-CM | POA: Diagnosis not present

## 2018-09-06 DIAGNOSIS — Z880 Allergy status to penicillin: Secondary | ICD-10-CM

## 2018-09-06 DIAGNOSIS — J45909 Unspecified asthma, uncomplicated: Secondary | ICD-10-CM | POA: Diagnosis present

## 2018-09-06 DIAGNOSIS — Z833 Family history of diabetes mellitus: Secondary | ICD-10-CM

## 2018-09-06 DIAGNOSIS — Z5329 Procedure and treatment not carried out because of patient's decision for other reasons: Secondary | ICD-10-CM | POA: Diagnosis not present

## 2018-09-06 DIAGNOSIS — E039 Hypothyroidism, unspecified: Secondary | ICD-10-CM | POA: Diagnosis present

## 2018-09-06 DIAGNOSIS — D649 Anemia, unspecified: Secondary | ICD-10-CM | POA: Diagnosis not present

## 2018-09-06 DIAGNOSIS — A419 Sepsis, unspecified organism: Secondary | ICD-10-CM | POA: Diagnosis present

## 2018-09-06 DIAGNOSIS — E119 Type 2 diabetes mellitus without complications: Secondary | ICD-10-CM | POA: Diagnosis not present

## 2018-09-06 DIAGNOSIS — E1165 Type 2 diabetes mellitus with hyperglycemia: Secondary | ICD-10-CM | POA: Diagnosis present

## 2018-09-06 DIAGNOSIS — J851 Abscess of lung with pneumonia: Secondary | ICD-10-CM | POA: Diagnosis present

## 2018-09-06 DIAGNOSIS — Z6837 Body mass index (BMI) 37.0-37.9, adult: Secondary | ICD-10-CM

## 2018-09-06 DIAGNOSIS — I10 Essential (primary) hypertension: Secondary | ICD-10-CM | POA: Diagnosis present

## 2018-09-06 DIAGNOSIS — R042 Hemoptysis: Secondary | ICD-10-CM | POA: Diagnosis present

## 2018-09-06 DIAGNOSIS — E785 Hyperlipidemia, unspecified: Secondary | ICD-10-CM | POA: Diagnosis present

## 2018-09-06 DIAGNOSIS — I361 Nonrheumatic tricuspid (valve) insufficiency: Secondary | ICD-10-CM | POA: Diagnosis not present

## 2018-09-06 DIAGNOSIS — E876 Hypokalemia: Secondary | ICD-10-CM | POA: Diagnosis present

## 2018-09-06 DIAGNOSIS — Z793 Long term (current) use of hormonal contraceptives: Secondary | ICD-10-CM

## 2018-09-06 DIAGNOSIS — K219 Gastro-esophageal reflux disease without esophagitis: Secondary | ICD-10-CM | POA: Diagnosis present

## 2018-09-06 DIAGNOSIS — Y95 Nosocomial condition: Secondary | ICD-10-CM | POA: Diagnosis present

## 2018-09-06 DIAGNOSIS — E8809 Other disorders of plasma-protein metabolism, not elsewhere classified: Secondary | ICD-10-CM | POA: Diagnosis present

## 2018-09-06 DIAGNOSIS — J852 Abscess of lung without pneumonia: Secondary | ICD-10-CM | POA: Diagnosis present

## 2018-09-06 DIAGNOSIS — Z20828 Contact with and (suspected) exposure to other viral communicable diseases: Secondary | ICD-10-CM | POA: Diagnosis present

## 2018-09-06 DIAGNOSIS — Z6841 Body Mass Index (BMI) 40.0 and over, adult: Secondary | ICD-10-CM

## 2018-09-06 DIAGNOSIS — J189 Pneumonia, unspecified organism: Secondary | ICD-10-CM | POA: Diagnosis present

## 2018-09-06 DIAGNOSIS — Z87891 Personal history of nicotine dependence: Secondary | ICD-10-CM | POA: Diagnosis not present

## 2018-09-06 LAB — COMPREHENSIVE METABOLIC PANEL
ALT: 33 U/L (ref 0–44)
AST: 24 U/L (ref 15–41)
Albumin: 2.7 g/dL — ABNORMAL LOW (ref 3.5–5.0)
Alkaline Phosphatase: 74 U/L (ref 38–126)
Anion gap: 10 (ref 5–15)
BUN: 7 mg/dL (ref 6–20)
CO2: 20 mmol/L — ABNORMAL LOW (ref 22–32)
Calcium: 8.4 mg/dL — ABNORMAL LOW (ref 8.9–10.3)
Chloride: 107 mmol/L (ref 98–111)
Creatinine, Ser: 0.75 mg/dL (ref 0.44–1.00)
GFR calc Af Amer: >60 mL/min (ref >60)
GFR calc non Af Amer: >60 mL/min (ref >60)
Glucose, Bld: 138 mg/dL — ABNORMAL HIGH (ref 70–99)
Potassium: 3.3 mmol/L — ABNORMAL LOW (ref 3.5–5.1)
Sodium: 137 mmol/L (ref 135–145)
Total Bilirubin: 0.3 mg/dL (ref 0.3–1.2)
Total Protein: 7.1 g/dL (ref 6.5–8.1)

## 2018-09-06 LAB — URINALYSIS, ROUTINE W REFLEX MICROSCOPIC
Bilirubin Urine: NEGATIVE
Glucose, UA: NEGATIVE mg/dL
Hgb urine dipstick: NEGATIVE
Ketones, ur: NEGATIVE mg/dL
Nitrite: NEGATIVE
Protein, ur: NEGATIVE mg/dL
Specific Gravity, Urine: 1.006 (ref 1.005–1.030)
pH: 8 (ref 5.0–8.0)

## 2018-09-06 LAB — BASIC METABOLIC PANEL
Anion gap: 10 (ref 5–15)
BUN: 7 mg/dL (ref 6–20)
CO2: 20 mmol/L — ABNORMAL LOW (ref 22–32)
Calcium: 8.3 mg/dL — ABNORMAL LOW (ref 8.9–10.3)
Chloride: 107 mmol/L (ref 98–111)
Creatinine, Ser: 0.76 mg/dL (ref 0.44–1.00)
GFR calc Af Amer: >60 mL/min (ref >60)
GFR calc non Af Amer: >60 mL/min (ref >60)
Glucose, Bld: 140 mg/dL — ABNORMAL HIGH (ref 70–99)
Potassium: 3.3 mmol/L — ABNORMAL LOW (ref 3.5–5.1)
Sodium: 137 mmol/L (ref 135–145)

## 2018-09-06 LAB — CBC WITH DIFFERENTIAL/PLATELET
Abs Immature Granulocytes: 0.15 10*3/uL — ABNORMAL HIGH (ref 0.00–0.07)
Basophils Absolute: 0 10*3/uL (ref 0.0–0.1)
Basophils Relative: 0 %
Eosinophils Absolute: 0 10*3/uL (ref 0.0–0.5)
Eosinophils Relative: 0 %
HCT: 32.8 % — ABNORMAL LOW (ref 36.0–46.0)
Hemoglobin: 10.1 g/dL — ABNORMAL LOW (ref 12.0–15.0)
Immature Granulocytes: 1 %
Lymphocytes Relative: 14 %
Lymphs Abs: 2.8 10*3/uL (ref 0.7–4.0)
MCH: 27.2 pg (ref 26.0–34.0)
MCHC: 30.8 g/dL (ref 30.0–36.0)
MCV: 88.2 fL (ref 80.0–100.0)
Monocytes Absolute: 2.1 10*3/uL — ABNORMAL HIGH (ref 0.1–1.0)
Monocytes Relative: 10 %
Neutro Abs: 14.9 10*3/uL — ABNORMAL HIGH (ref 1.7–7.7)
Neutrophils Relative %: 75 %
Platelets: 589 10*3/uL — ABNORMAL HIGH (ref 150–400)
RBC: 3.72 MIL/uL — ABNORMAL LOW (ref 3.87–5.11)
RDW: 15.1 % (ref 11.5–15.5)
WBC: 20 10*3/uL — ABNORMAL HIGH (ref 4.0–10.5)
nRBC: 0 % (ref 0.0–0.2)

## 2018-09-06 LAB — PROTIME-INR
INR: 1.1 (ref 0.8–1.2)
Prothrombin Time: 14.3 seconds (ref 11.4–15.2)

## 2018-09-06 LAB — VITAMIN B12: Vitamin B-12: 208 pg/mL (ref 180–914)

## 2018-09-06 LAB — MAGNESIUM: Magnesium: 1.8 mg/dL (ref 1.7–2.4)

## 2018-09-06 LAB — LACTIC ACID, PLASMA
Lactic Acid, Venous: 1 mmol/L (ref 0.5–1.9)
Lactic Acid, Venous: 1.1 mmol/L (ref 0.5–1.9)
Lactic Acid, Venous: 1 mmol/L (ref 0.5–1.9)

## 2018-09-06 LAB — RETICULOCYTES
Immature Retic Fract: 20.1 % — ABNORMAL HIGH (ref 2.3–15.9)
RBC.: 3.69 MIL/uL — ABNORMAL LOW (ref 3.87–5.11)
Retic Count, Absolute: 79 10*3/uL (ref 19.0–186.0)
Retic Ct Pct: 2.1 % (ref 0.4–3.1)

## 2018-09-06 LAB — FOLATE: Folate: 10.1 ng/mL (ref >5.9)

## 2018-09-06 LAB — SARS CORONAVIRUS 2 BY RT PCR (HOSPITAL ORDER, PERFORMED IN ~~LOC~~ HOSPITAL LAB): SARS Coronavirus 2: NEGATIVE

## 2018-09-06 LAB — IRON AND TIBC
Iron: 9 ug/dL — ABNORMAL LOW (ref 28–170)
Saturation Ratios: 3 % — ABNORMAL LOW (ref 10.4–31.8)
TIBC: 302 ug/dL (ref 250–450)
UIBC: 293 ug/dL

## 2018-09-06 LAB — GLUCOSE, CAPILLARY: Glucose-Capillary: 105 mg/dL — ABNORMAL HIGH (ref 70–99)

## 2018-09-06 LAB — APTT: aPTT: 31 seconds (ref 24–36)

## 2018-09-06 LAB — CBG MONITORING, ED
Glucose-Capillary: 144 mg/dL — ABNORMAL HIGH (ref 70–99)
Glucose-Capillary: 104 mg/dL — ABNORMAL HIGH (ref 70–99)

## 2018-09-06 LAB — FERRITIN: Ferritin: 331 ng/mL — ABNORMAL HIGH (ref 11–307)

## 2018-09-06 LAB — ABO/RH: ABO/RH(D): B POS

## 2018-09-06 LAB — PHOSPHORUS: Phosphorus: 3.4 mg/dL (ref 2.5–4.6)

## 2018-09-06 MED ORDER — IOHEXOL 350 MG/ML SOLN
100.0000 mL | Freq: Once | INTRAVENOUS | Status: AC | PRN
  Administered 2018-09-06: 17:00:00 100 mL via INTRAVENOUS

## 2018-09-06 MED ORDER — METRONIDAZOLE IN NACL 5-0.79 MG/ML-% IV SOLN
500.0000 mg | Freq: Once | INTRAVENOUS | Status: AC
  Administered 2018-09-06: 500 mg via INTRAVENOUS
  Filled 2018-09-06: qty 100

## 2018-09-06 MED ORDER — SODIUM CHLORIDE (PF) 0.9 % IJ SOLN
INTRAMUSCULAR | Status: AC
  Administered 2018-09-06: 17:00:00
  Filled 2018-09-06: qty 50

## 2018-09-06 MED ORDER — SODIUM CHLORIDE 0.9 % IV SOLN
INTRAVENOUS | Status: DC
  Administered 2018-09-06: 22:00:00 via INTRAVENOUS

## 2018-09-06 MED ORDER — SODIUM CHLORIDE 0.9 % IV SOLN
1.0000 g | Freq: Once | INTRAVENOUS | Status: AC
  Administered 2018-09-06: 1 g via INTRAVENOUS
  Filled 2018-09-06: qty 1

## 2018-09-06 MED ORDER — POTASSIUM CHLORIDE CRYS ER 20 MEQ PO TBCR
40.0000 meq | EXTENDED_RELEASE_TABLET | Freq: Once | ORAL | Status: AC
  Administered 2018-09-06: 19:00:00 40 meq via ORAL
  Filled 2018-09-06: qty 2

## 2018-09-06 MED ORDER — SODIUM CHLORIDE 0.9 % IV SOLN
2.0000 g | Freq: Three times a day (TID) | INTRAVENOUS | Status: DC
  Administered 2018-09-06 – 2018-09-08 (×5): 2 g via INTRAVENOUS
  Filled 2018-09-06 (×7): qty 2

## 2018-09-06 MED ORDER — VANCOMYCIN HCL IN DEXTROSE 1-5 GM/200ML-% IV SOLN
1000.0000 mg | Freq: Two times a day (BID) | INTRAVENOUS | Status: DC
  Administered 2018-09-07: 08:00:00 1000 mg via INTRAVENOUS
  Filled 2018-09-06: qty 200

## 2018-09-06 MED ORDER — INSULIN GLARGINE 100 UNIT/ML ~~LOC~~ SOLN
10.0000 [IU] | Freq: Two times a day (BID) | SUBCUTANEOUS | Status: DC
  Administered 2018-09-06 – 2018-09-08 (×4): 10 [IU] via SUBCUTANEOUS
  Filled 2018-09-06 (×5): qty 0.1

## 2018-09-06 MED ORDER — PANTOPRAZOLE SODIUM 20 MG PO TBEC
20.0000 mg | DELAYED_RELEASE_TABLET | Freq: Two times a day (BID) | ORAL | Status: DC
  Administered 2018-09-07 – 2018-09-08 (×3): 20 mg via ORAL
  Filled 2018-09-06 (×4): qty 1

## 2018-09-06 MED ORDER — BISACODYL 10 MG RE SUPP: 10.0000 mg | Freq: Every day | RECTAL | Status: DC | PRN

## 2018-09-06 MED ORDER — DEXTROMETHORPHAN POLISTIREX ER 30 MG/5ML PO SUER
30.0000 mg | Freq: Two times a day (BID) | ORAL | Status: DC
  Administered 2018-09-06 – 2018-09-08 (×4): 30 mg via ORAL
  Filled 2018-09-06 (×4): qty 5

## 2018-09-06 MED ORDER — METRONIDAZOLE IN NACL 5-0.79 MG/ML-% IV SOLN
500.0000 mg | Freq: Three times a day (TID) | INTRAVENOUS | Status: DC
  Administered 2018-09-06 – 2018-09-08 (×5): 500 mg via INTRAVENOUS
  Filled 2018-09-06 (×5): qty 100

## 2018-09-06 MED ORDER — INSULIN ASPART 100 UNIT/ML ~~LOC~~ SOLN
0.0000 [IU] | SUBCUTANEOUS | Status: DC
  Administered 2018-09-07: 2 [IU] via SUBCUTANEOUS

## 2018-09-06 MED ORDER — VANCOMYCIN HCL 10 G IV SOLR
2000.0000 mg | Freq: Once | INTRAVENOUS | Status: AC
  Administered 2018-09-06: 2000 mg via INTRAVENOUS
  Filled 2018-09-06: qty 2000

## 2018-09-06 MED ORDER — ACETAMINOPHEN 325 MG PO TABS
650.0000 mg | ORAL_TABLET | Freq: Once | ORAL | Status: AC
  Administered 2018-09-06: 650 mg via ORAL
  Filled 2018-09-06: qty 2

## 2018-09-06 MED ORDER — SODIUM CHLORIDE 0.9 % IV BOLUS
1000.0000 mL | Freq: Once | INTRAVENOUS | Status: AC
  Administered 2018-09-06: 1000 mL via INTRAVENOUS

## 2018-09-06 NOTE — ED Notes (Signed)
Patient ambulated to restroom with minimal assistance. ?

## 2018-09-06 NOTE — ED Notes (Signed)
Bed: VQ00 Expected date:  Expected time:  Means of arrival:  Comments: EMS/uri sx/fever

## 2018-09-06 NOTE — Progress Notes (Signed)
Pharmacy Antibiotic Note  Jazzlynn Slesinski is a 33 y.o. female admitted on 09/06/2018 with pneumonia.  Pharmacy has been consulted for Vancomycin, cefepime dosing.  Plan: Vancomycin 2gm iv x1(1452), then Vancomycin 1000 mg IV Q 12 hrs. Goal AUC 400-550. Expected AUC: 506 SCr used: 0.8 (adjusted)  Cefepime 2gm iv x1(1452), then 2gm iv q8hr   Height: (S) 5\' 4"  (162.6 cm) Weight: (S) 225 lb (102.1 kg) IBW/kg (Calculated) : 54.7  Temp (24hrs), Avg:99.9 F (37.7 C), Min:98.7 F (37.1 C), Max:101 F (38.3 C)  Recent Labs  Lab 09/06/18 1240 09/06/18 1245 09/06/18 1450  WBC 20.0*  --   --   CREATININE 0.75 0.76  --   LATICACIDVEN  --  1.0 1.1    Estimated Creatinine Clearance: 118.5 mL/min (by C-G formula based on SCr of 0.76 mg/dL).    Allergies  Allergen Reactions  . Amoxicillin Other (See Comments)    Pain in back  . Apple Anaphylaxis    Peaches, plums, pears, bananas, grapes, kiwi, all melons. CAN HAVE BLUEBERRIES AND STRAWBERRIES  . Corn-Containing Products Anaphylaxis and Hives  . Penicillins Other (See Comments)    Did it involve swelling of the face/tongue/throat, SOB, or low BP? Yes Did it involve sudden or severe rash/hives, skin peeling, or any reaction on the inside of your mouth or nose? Yes Did you need to seek medical attention at a hospital or doctor's office? Was already at hospital When did it last happen?2010 If all above answers are "NO", may proceed with cephalosporin use.   . Soy Allergy Anaphylaxis  . Fruit & Vegetable Daily [Nutritional Supplements]     Allergy to fruits- Grapes, pears, peaches, apples, plums, bananas, kiwi, melons and mangoes.   . Peanut-Containing Drug Products Hives    ALL NUTS!  . Shellfish Allergy Hives, Itching and Swelling  . Wheat Hives and Itching    Antimicrobials this admission: Vancomycin 09/06/2018 >> Cefepime 09/06/2018 >>  Flagyl 09/06/2018 >>   Dose adjustments this admission: -  Microbiology  results: -  Thank you for allowing pharmacy to be a part of this patient's care.  Aleene Davidson Crowford 09/06/2018 9:50 PM

## 2018-09-06 NOTE — ED Provider Notes (Addendum)
[facility] DEPT Provider Note   CSN: [id number] Arrival date & time: 09/06/18  1124    History   Chief Complaint Chief Complaint  Patient presents with   Cough   Shortness of Breath   Fever    HPI Kathleen [name] is a 33 y.o. female.      Cough  Associated symptoms: fever and shortness of breath   Shortness of Breath  Associated symptoms: cough and fever   Fever  Associated symptoms: cough      33 year old female brought here via EMS for evaluation of cough and shortness of breath.  Patient states last month she was admitted to the hospital for DKA.  She was hospitalized May 9 and was there for 5 days.  During her hospitalization she developed a cough which thought to be related to her persistent vomiting.  Her cough persists and recently became increasingly worse.  At the recommendation of her mom she is here.  She mentioned cough is productive with yellow-green sputum.  Represcribed but does endorse fever for unknown period time.  States she sweats all the time.  She also endorsed shortness of breath with persistent coughing and with exertion.  She complaining of pleuritic chest pain specifically to the left side of her chest..  She tries using medication at home without adequate relief.  She is currently on oral hormone birth control pill.  She denies any prior history of PE or DVT.  She denies any recent contact with anyone that test positive for COVID-19.  Past Medical History:  Diagnosis Date   Allergy    Bronchitis     Patient Active Problem List   Diagnosis Date Noted   Benign essential hypertension 08/30/2018   Reactive airway disease with wheezing 08/23/2018   GERD (gastroesophageal reflux disease) 08/16/2018   Secondary DM with DKA, uncontrolled ([place]) 08/11/2018   Type 2 diabetes mellitus without complications ([place]) 02/40/9735   Dyslipidemia 01/04/2017   Contraception management 01/04/2017   Multiple food  allergies 01/04/2017   Morbid obesity with BMI of 40.0-44.9, adult ([place]) 03/15/2014    No past surgical history on file.   OB History   No obstetric history on file.      Home Medications    Prior to Admission medications   Medication Sig Start Date End Date Taking? Authorizing Provider  amLODipine (NORVASC) 2.5 MG tablet Take 1 tablet (2.5 mg total) by mouth daily. 08/30/18   [place], Betty G, MD  benzonatate (TESSALON) 100 MG capsule Take 1 capsule (100 mg total) by mouth 2 (two) times daily. 09/04/18   [place], Betty G, MD  Continuous Blood Gluc Receiver (FREESTYLE LIBRE READER) DEVI 1 Device by Does not apply route as directed. 08/16/18   [place], Betty G, MD  Continuous Blood Gluc Sensor (FREESTYLE LIBRE SENSOR SYSTEM) MISC 1 Device by Does not apply route 3 (three) times daily as needed. 08/16/18   [place], Betty G, MD  EPINEPHrine (EPIPEN) 0.3 mg/0.3 mL IJ SOAJ injection Inject 0.3 mLs (0.3 mg total) into the muscle once as needed (for severe allergic reaction). 01/04/17   [place], Betty G, MD  glucose blood test strip Use as instructed 09/04/18   [place], Betty G, MD  glucose monitoring kit (FREESTYLE) monitoring kit 1 each by Does not apply route as needed for other. 08/15/18   Nita Sells, MD  Insulin Aspart FlexPen 100 UNIT/ML SOPN Inject 6 Units into the skin 3 (three) times daily with meals. 08/15/18   Nita Sells,  MD  Insulin Glargine (BASAGLAR KWIKPEN) 100 UNIT/ML SOPN Inject 0.1 mLs (10 Units total) into the skin 2 (two) times daily. 08/30/18   [place], Betty G, MD  Lancets [facility] ULTRASOFT) lancets Use as instructed 09/04/18   [place], Betty G, MD  levonorgestrel-ethinyl estradiol (LEVORA 0.15/30, 28,) 0.15-30 MG-MCG tablet Take 1 tablet by mouth daily. 08/16/18   [place], Betty G, MD  metFORMIN (GLUCOPHAGE) 500 MG tablet Take 1 tablet (500 mg total) by mouth daily with breakfast. 08/30/18   [place], Betty G, MD  pantoprazole (PROTONIX) 20 MG tablet Take 1 tablet (20 mg  total) by mouth 2 (two) times daily before a meal. 08/30/18   [place], Betty G, MD  potassium chloride SA (K-DUR) 20 MEQ tablet Take 2 tablets (40 mEq total) by mouth 2 (two) times daily. 08/15/18   Nita Sells, MD    Family History Family History  Problem Relation Age of Onset   Diabetes Father    Diabetes Maternal Grandmother    Cancer Neg Hx    Hypertension Neg Hx    Stroke Neg Hx     Social History Social History   Tobacco Use   Smoking status: Never Smoker   Smokeless tobacco: Never Used  Substance Use Topics   Alcohol use: Yes    Alcohol/week: 1.0 standard drinks    Types: 1 Standard drinks or equivalent per week   Drug use: No     Allergies   Amoxicillin; Apple; Corn-containing products; Penicillins; Fruit & vegetable daily [nutritional supplements]; Peanut-containing drug products; Shellfish allergy; and Wheat   Review of Systems Review of Systems  Constitutional: Positive for fever.  Respiratory: Positive for cough and shortness of breath.   All other systems reviewed and are negative.    Physical Exam Updated Vital Signs BP (!) 141/82 (BP Location: Left Arm)    Pulse (!) 127    Temp 99.6 F (37.6 C) (Oral)    Resp (!) 98    LMP 08/09/2018 (Approximate)    SpO2 97% Comment: Simultaneous filing. User may not have seen previous data.  Physical Exam Vitals signs and nursing note reviewed.  Constitutional:      General: She is not in acute distress.    Appearance: She is well-developed. She is obese.  HENT:     Head: Atraumatic.  Eyes:     Conjunctiva/sclera: Conjunctivae normal.  Neck:     Musculoskeletal: Neck supple.  Cardiovascular:     Rate and Rhythm: Tachycardia present.  Pulmonary:     Effort: Pulmonary effort is normal.     Breath sounds: Examination of the left-middle field reveals decreased breath sounds. Decreased breath sounds present. No wheezing, rhonchi or rales.  Chest:     Chest wall: No tenderness.  Abdominal:      Tenderness: There is no abdominal tenderness.  Musculoskeletal:     Right lower leg: No edema.     Left lower leg: No edema.  Skin:    Capillary Refill: Capillary refill takes less than 2 seconds.     Findings: No rash.  Neurological:     Mental Status: She is alert and oriented to person, place, and time.  Psychiatric:        Mood and Affect: Mood normal.      ED Treatments / Results  Labs (all labs ordered are listed, but only abnormal results are displayed) Labs Reviewed  BASIC METABOLIC PANEL - Abnormal; Notable for the following components:      Result Value  Potassium 3.3 (*)    CO2 20 (*)    Glucose, Bld 140 (*)    Calcium 8.3 (*)    All other components within normal limits  COMPREHENSIVE METABOLIC PANEL - Abnormal; Notable for the following components:   Potassium 3.3 (*)    CO2 20 (*)    Glucose, Bld 138 (*)    Calcium 8.4 (*)    Albumin 2.7 (*)    All other components within normal limits  CBC WITH DIFFERENTIAL/PLATELET - Abnormal; Notable for the following components:   WBC 20.0 (*)    RBC 3.72 (*)    Hemoglobin 10.1 (*)    HCT 32.8 (*)    Platelets 589 (*)    Neutro Abs 14.9 (*)    Monocytes Absolute 2.1 (*)    Abs Immature Granulocytes 0.15 (*)    All other components within normal limits  URINALYSIS, ROUTINE W REFLEX MICROSCOPIC - Abnormal; Notable for the following components:   Leukocytes,Ua SMALL (*)    Bacteria, UA FEW (*)    All other components within normal limits  IRON AND TIBC - Abnormal; Notable for the following components:   Iron 9 (*)    Saturation Ratios 3 (*)    All other components within normal limits  FERRITIN - Abnormal; Notable for the following components:   Ferritin 331 (*)    All other components within normal limits  RETICULOCYTES - Abnormal; Notable for the following components:   RBC. 3.69 (*)    Immature Retic Fract 20.1 (*)    All other components within normal limits  PREALBUMIN - Abnormal; Notable for the  following components:   Prealbumin 8.8 (*)    All other components within normal limits  PHOSPHORUS - Abnormal; Notable for the following components:   Phosphorus 2.2 (*)    All other components within normal limits  TSH - Abnormal; Notable for the following components:   TSH 7.090 (*)    All other components within normal limits  COMPREHENSIVE METABOLIC PANEL - Abnormal; Notable for the following components:   CO2 21 (*)    Glucose, Bld 118 (*)    Calcium 8.2 (*)    Albumin 2.5 (*)    All other components within normal limits  CBC - Abnormal; Notable for the following components:   WBC 14.7 (*)    RBC 3.58 (*)    Hemoglobin 9.6 (*)    HCT 32.8 (*)    MCHC 29.3 (*)    Platelets 541 (*)    All other components within normal limits  GLUCOSE, CAPILLARY - Abnormal; Notable for the following components:   Glucose-Capillary 105 (*)    All other components within normal limits  GLUCOSE, CAPILLARY - Abnormal; Notable for the following components:   Glucose-Capillary 112 (*)    All other components within normal limits  GLUCOSE, CAPILLARY - Abnormal; Notable for the following components:   Glucose-Capillary 131 (*)    All other components within normal limits  GLUCOSE, CAPILLARY - Abnormal; Notable for the following components:   Glucose-Capillary 157 (*)    All other components within normal limits  CBG MONITORING, ED - Abnormal; Notable for the following components:   Glucose-Capillary 144 (*)    All other components within normal limits  CBG MONITORING, ED - Abnormal; Notable for the following components:   Glucose-Capillary 104 (*)    All other components within normal limits  SARS CORONAVIRUS 2 (HOSPITAL ORDER, [facility])  CULTURE, BLOOD (  ROUTINE X 2)  CULTURE, BLOOD (ROUTINE X 2)  EXPECTORATED SPUTUM ASSESSMENT W REFEX TO RESP CULTURE  CULTURE, RESPIRATORY  MRSA PCR SCREENING  LACTIC ACID, PLASMA  LACTIC ACID, PLASMA  MAGNESIUM  PHOSPHORUS    VITAMIN B12  FOLATE  HIV ANTIBODY (ROUTINE TESTING W REFLEX)  STREP PNEUMONIAE URINARY ANTIGEN  LACTIC ACID, PLASMA  LACTIC ACID, PLASMA  PROCALCITONIN  PROTIME-INR  APTT  MAGNESIUM  GLUCOSE, CAPILLARY  GLUCOSE, CAPILLARY  T4, FREE  HEMOGLOBIN A1C  CBC  BASIC METABOLIC PANEL  ABO/RH    EKG EKG Interpretation  Date/Time:  Wednesday September 06 2018 11:34:33 EDT Ventricular Rate:  128 PR Interval:    QRS Duration: 81 QT Interval:  302 QTC Calculation: 441 R Axis:   70 Text Interpretation:  Sinus tachycardia Consider right atrial enlargement Borderline T wave abnormalities No acute changes Nonspecific ST and T wave abnormality No significant change since last tracing Confirmed by Varney Biles [phone]) on 09/06/2018 11:56:20 AM   Radiology Ct Angio Chest Pe W And/or Wo Contrast  Result Date: 09/06/2018 CLINICAL DATA:  Cough, shortness of breath, chest pain, fever EXAM: CT ANGIOGRAPHY CHEST WITH CONTRAST TECHNIQUE: Multidetector CT imaging of the chest was performed using the standard protocol during bolus administration of intravenous contrast. Multiplanar CT image reconstructions and MIPs were obtained to evaluate the vascular anatomy. CONTRAST:  136m OMNIPAQUE IOHEXOL 350 MG/ML SOLN COMPARISON:  08/12/2018, same-day chest radiographs FINDINGS: Cardiovascular: Satisfactory opacification of the pulmonary arteries to the segmental level. No evidence of pulmonary embolism. Normal heart size. No pericardial effusion. Mediastinum/Nodes: Enlarged AP window and left paratracheal lymph nodes, largest AP window node measuring 2.3 cm in short axis (series 5, image 71). Thyroid gland, trachea, and esophagus demonstrate no significant findings. Lungs/Pleura: There is a masslike opacity of the left upper lobe measuring approximately 6.6 x 5.2 cm with an internal cavitary component and air and fluid level, in keeping with findings of prior radiographs (series 5, image 93). No pleural effusion or  pneumothorax. Upper Abdomen: No acute abnormality. Musculoskeletal: No chest wall abnormality. No acute or significant osseous findings. Review of the MIP images confirms the above findings. IMPRESSION: 1.  Negative examination for pulmonary embolism. 2. There is a masslike opacity of the left upper lobe measuring approximately 6.6 x 5.2 cm with an internal cavitary component and air and fluid level, in keeping with findings of prior radiographs (series 5, image 93). Given short interval since recent prior examination, this is likely cavitary infection. 3. Enlarged AP window and left paratracheal lymph nodes, largest AP window node measuring 2.3 cm in short axis (series 5, image 71), likely reactive in the setting of presumed infection. Electronically Signed   By: AEddie CandleM.D.   On: 09/06/2018 17:35   Dg Chest Port 1 View  Result Date: 09/06/2018 CLINICAL DATA:  Cough and congestion for several days EXAM: PORTABLE CHEST 1 VIEW COMPARISON:  08/11/2018 FINDINGS: Cardiac shadow is within normal limits. The lungs are well aerated bilaterally. There is a cavitary lesion identified in the mid left lung. No bony abnormality is noted. IMPRESSION: Cavitary lesion in the left mid lung suggestive of lung abscess. CT may be helpful for further evaluation Electronically Signed   By: MInez CatalinaM.D.   On: 09/06/2018 12:11    Procedures .Critical Care Performed by: TDomenic Moras PA-C Authorized by: TDomenic Moras PA-C   Critical care provider statement:    Critical care time (minutes):  45   Critical care was time spent  personally by me on the following activities:  Discussions with consultants, evaluation of patient's response to treatment, examination of patient, ordering and performing treatments and interventions, ordering and review of laboratory studies, ordering and review of radiographic studies, pulse oximetry, re-evaluation of patient's condition, obtaining history from patient or surrogate and review of  old charts   (including critical care time)  Medications Ordered in ED Medications  metroNIDAZOLE (FLAGYL) IVPB 500 mg (500 mg Intravenous New Bag/Given 09/06/18 1627)  sodium chloride (PF) 0.9 % injection (0 mLs  Hold 09/06/18 1647)  sodium chloride 0.9 % bolus 1,000 mL (0 mLs Intravenous Stopped 09/06/18 1337)  acetaminophen (TYLENOL) tablet 650 mg (650 mg Oral Given 09/06/18 1258)  ceFEPIme (MAXIPIME) 1 g in sodium chloride 0.9 % 100 mL IVPB (0 g Intravenous Stopped 09/06/18 1537)  vancomycin (VANCOCIN) 2,000 mg in sodium chloride 0.9 % 500 mL IVPB (2,000 mg Intravenous New Bag/Given 09/06/18 1452)  iohexol (OMNIPAQUE) 350 MG/ML injection 100 mL (100 mLs Intravenous Contrast Given 09/06/18 1659)     Initial Impression / Assessment and Plan / ED Course  I have reviewed the triage vital signs and the nursing notes.  Pertinent labs & imaging results that were available during my care of the patient were reviewed by me and considered in my medical decision making (see chart for details).  Clinical Course as of Sep 06 1698  Wed Sep 06, 2018  1756 IMPRESSION: 1. Negative examination for pulmonary embolism. 2. There is a masslike opacity of the left upper lobe measuring approximately 6.6 x 5.2 cm with an internal cavitary component and air and fluid level, in keeping with findings of prior radiographs (series 5, image 93). Given short interval since recent prior examination, this is likely cavitary infection. 3. Enlarged AP window and left paratracheal lymph nodes, largest AP window node measuring 2.3 cm in short axis (series 5, image 71), likely reactive in the setting of presumed infection.  CT Angio Chest PE W and/or Wo Contrast [CG]    Clinical Course User Index [CG] Kinnie Feil, PA-C       BP 132/81    Pulse (!) 107    Temp (S) (!) 100.4 F (38 C) (Rectal)    Resp (!) 25    Ht (S) _0  (1.626 m)    Wt (S) 102.1 kg    LMP 08/09/2018 (Approximate)    SpO2 100%    BMI 38.62 kg/m     Final Clinical Impressions(s) / ED Diagnoses   Final diagnoses:  HCAP (healthcare-associated pneumonia)  Sepsis, due to unspecified organism, unspecified whether acute organ dysfunction present [facility])    ED Discharge Orders    None     1:26 PM Patient here with complaints of chest pain shortness of breath productive cough and fever concerning for pneumonia.  Was hospitalized last month for DKA and developed a cough at that time.  Her current chest x-ray shows a left cavitary lesion concerning for a lung abscess.  She was found to be febrile with a rectal temp of 101, tachycardic and tachypneic.  In the room, O2 sats is 99% on room air.  Will test for COVID-19, give antibiotic to cover for hospital-acquired pneumonia and will consult for admission.  Code sepsis initiated.  5:12 PM Pt sign out to oncoming team who will f/u on chest CTA and will consult medicine for admission for HCAP.  I did previously consulted hospitalist Dr. Maylene Roes who request CTA result prior admission.    Rona Ravens,  Gertie Fey, PA-C 09/06/18 1715    Domenic Moras, PA-C 09/07/18 [place], Ankit, MD 09/08/18 [phone]

## 2018-09-06 NOTE — H&P (Addendum)
Kathleen [name] AJG:[id number] DOB: 1986-02-15 DOA: 09/06/2018     PCP: Martinique, Betty G, MD   Outpatient Specialists:  NONE   Patient arrived to ER on 09/06/18 at 1124  Patient coming from: home Lives  With family    Chief Complaint:  Chief Complaint  Patient presents with   Cough   Shortness of Breath   Fever    HPI: Kathleen [name] is a 33 y.o. female with medical history significant of Dm 2, HLD, HTN, multiple food allergies history of asthma    Presented with  Cough and fever She has been having still coughing ever since her discharge at first she thought it was secondary to her electric fumes coming out of her cystoscopy she possibly has history of reactive airway disease and has been having occasional wheezing. Patient was last time admitted in the beginning of May for DKA discharged home on 12 May She was found at that time to have pneumomediastinum felt to be secondary to excessive vomiting and coughing.  Patient continued to cough since her discharge and then eventually started to notice a little bit of blood in her sputum.  She called her primary care provider and try to use Tessalon Perles to help with the cough she has not had fevers. Been so severe sometimes causes nausea. Primary care provider prescribed albuterol inhaler but that did not seem to help for her cough and she was referred to pulmonologist. Is been reporting some fatigue denies any sick contacts No travel.  She is originally from [place] but has been residing in [place]. She is using PPI but that did not seem to help her cough either. Denies any melena no blood in her stool. Today she developed a fever and worsening chest pain which is worse with movement and cough. She was found to be tachycardic up to 130s with temperature up to 101  Has been productive of yellow-green sputum.  She has been having sweating episodes.  Short of breath which is worse when she starts to cough.   Chest pain has been pleuritic in nature as well.  Nothing seems to help her cough. Patient is on birth control pill Reports no longer had hemoptysis Reports she stopped smoking 1 m ago She used to drink a few Margaritas a week   Infectious risk factors:  Reports fever, shortness of breath,  cough, chest pain,  severe fatigue    In RAPID COVID TEST NEGATIVE   Regarding pertinent Chronic problems:    Hyperlipidemia - not on statins    HTN on Norvasc     DM 2 -  Lab Results  Component Value Date   HGBA1C 15.0 (H) 08/11/2018   on insulin, Glargine 10 units BID and Metformin     Morbid obesity-   BMI Readings from Last 1 Encounters:  09/06/18 38.62 kg/m       Asthma - on home inhalers not been using because feels like they do not help her cough                       No  history of intubation    While in ER: Noted to be febrile and tachycardic up to 127 increased respiration rate Plain chest x-ray showed cavitary lesion CT scan confirmed a large pulmonary abscess Air-fluid levels The following Work up has been ordered so far:  Orders Placed This Encounter  Procedures   [place]  SARS Coronavirus 2 [facility] order, Performed in [facility] hospital lab)   Blood Culture (routine x 2)   Culture, sputum-assessment   Gram stain   Gram stain   DG Chest Port 1 View   CT Angio Chest PE W and/or Wo Contrast   Basic metabolic panel   Lactic acid, plasma   Comprehensive metabolic panel   CBC WITH DIFFERENTIAL   Urinalysis, Routine w reflex microscopic   HIV antibody (Routine Screening)   Strep pneumoniae urinary antigen   Hemoglobin A1c   Lactic acid, plasma   Procalcitonin   Protime-INR   APTT   Magnesium   Phosphorus   Prealbumin   Vitamin B12   Folate   Iron and TIBC   Ferritin   Reticulocytes   Magnesium   Phosphorus   TSH   Comprehensive metabolic panel   CBC   Diet Carb Modified Fluid  consistency: Thin; Room service appropriate? Yes   Cardiac monitoring   Refer to Sidebar Report for: Sepsis Bundle ED/IP   Document vital signs within 1-hour of fluid bolus completion and notify provider of bolus completion   Document Actual / Estimated Weight   Height and weight   STAT CBG when hypoglycemia is suspected. If treated, recheck every 15 minutes after each treatment until CBG >/= 70 mg/dl   Refer to Hypoglycemia Protocol Sidebar Report for treatment of CBG < 70 mg/dl   No basal insulin at this time   If lactate (lactic acid) >2, verify repeat lactic acid order has been placed.   Document vital signs within 1-hour of fluid bolus completion and notify provider of bolus completion   Vital signs   Refer to Sidebar Report for: Sepsis Bundle ED/IP   Cardiac Monitoring Continuous x 48 hours Indications for use: Other; Other indications for use: sepsis   Vital signs   Cardiac monitoring   Vital signs   Notify physician   Up with assistance   If patient diabetic or glucose greater than 140 notify physician for Sliding Scale Insulin Orders   May go off telemetry for tests/procedures   Oral care per nursing protocol   Initiate Oral Care Protocol   Initiate Carrier Fluid Protocol   RN may order General Admission PRN Orders utilizing "General Admission PRN medications" (through manage orders) for the following patient needs: allergy symptoms (Claritin), cold sores (Carmex), cough (Robitussin DM), eye irritation (Liquifilm Tears), hemorrhoids (Tucks), indigestion (Maalox), minor skin irritation (Hydrocortisone Cream), muscle pain Suezanne Jacquet Gay), nose irritation (saline nasal spray) and sore throat (Chloraseptic spray).   SCDs   Patient has an active order for admit to inpatient/place in observation   Full code   Call Code Sepsis Karen Chafe [phone]) Reason for Consult? tracking   Consult to hospitalist  ALL PATIENTS BEING ADMITTED/HAVING PROCEDURES NEED COVID-19  SCREENING   Consult to hospitalist  ALL PATIENTS BEING ADMITTED/HAVING PROCEDURES NEED COVID-19 SCREENING   vancomycin per pharmacy consult   CeFEPIme (MAXIPIME) per pharmacy consult   Pharmacy Consult   Consult to dietitian   Pulse oximetry (single)   Pulse oximetry check with vital signs   Oxygen therapy Mode or (Route): Nasal cannula; Liters Per Minute: 2; Keep 02 saturation: greater than 92 %   Incentive spirometry   CBG monitoring, ED   CBG monitoring, ED   EKG 12-Lead   ED EKG   EKG 12-Lead   ECHOCARDIOGRAM COMPLETE   ABO/Rh   Insert peripheral IV   Saline lock IV   Admit to Inpatient (patient's expected  length of stay will be greater than 2 midnights or inpatient only procedure)   Admit to Inpatient (patient's expected length of stay will be greater than 2 midnights or inpatient only procedure)     Following Medications were ordered in ER: Medications  dextromethorphan (DELSYM) 30 MG/5ML liquid 30 mg (30 mg Oral Given 09/06/18 1929)  metroNIDAZOLE (FLAGYL) IVPB 500 mg (has no administration in time range)  insulin aspart (novoLOG) injection 0-9 Units (has no administration in time range)  Basaglar KwikPen KwikPen 10 Units (has no administration in time range)  pantoprazole (PROTONIX) EC tablet 20 mg (has no administration in time range)  0.9 %  sodium chloride infusion (has no administration in time range)  bisacodyl (DULCOLAX) suppository 10 mg (has no administration in time range)  sodium chloride 0.9 % bolus 1,000 mL (0 mLs Intravenous Stopped 09/06/18 1337)  acetaminophen (TYLENOL) tablet 650 mg (650 mg Oral Given 09/06/18 1258)  ceFEPIme (MAXIPIME) 1 g in sodium chloride 0.9 % 100 mL IVPB (0 g Intravenous Stopped 09/06/18 1537)  vancomycin (VANCOCIN) 2,000 mg in sodium chloride 0.9 % 500 mL IVPB (0 mg Intravenous Stopped 09/06/18 1652)  metroNIDAZOLE (FLAGYL) IVPB 500 mg (0 mg Intravenous Stopped 09/06/18 1853)  iohexol (OMNIPAQUE) 350 MG/ML injection  100 mL (100 mLs Intravenous Contrast Given 09/06/18 1659)  sodium chloride (PF) 0.9 % injection (  Given by Other 09/06/18 1659)  potassium chloride SA (K-DUR) CR tablet 40 mEq (40 mEq Oral Given 09/06/18 1929)        Consult Orders  (From admission, onward)         Start     Ordered   09/06/18 2145  Consult to dietitian  Once    Provider:  (Not yet assigned)  Question:  Reason for consult?  Answer:  Assessment of nutrition requirements/status   09/06/18 2144   09/06/18 1825  Consult to hospitalist  ALL PATIENTS BEING ADMITTED/HAVING PROCEDURES NEED COVID-19 SCREENING  Once    Comments:  ALL PATIENTS BEING ADMITTED/HAVING PROCEDURES NEED COVID-19 SCREENING  Provider:  (Not yet assigned)  Question Answer Comment  Place call to: [facility] Dr Maylene Roes   Reason for Consult Admit      09/06/18 1824   09/06/18 1512  Consult to hospitalist  ALL PATIENTS BEING ADMITTED/HAVING PROCEDURES NEED COVID-19 SCREENING  Once    Comments:  ALL PATIENTS BEING ADMITTED/HAVING PROCEDURES NEED COVID-19 SCREENING  Provider:  (Not yet assigned)  Question Answer Comment  Place call to: [facility]   Reason for Consult Admit      09/06/18 1512          Significant initial  Findings: Abnormal Labs Reviewed  BASIC METABOLIC PANEL - Abnormal; Notable for the following components:      Result Value   Potassium 3.3 (*)    CO2 20 (*)    Glucose, Bld 140 (*)    Calcium 8.3 (*)    All other components within normal limits  COMPREHENSIVE METABOLIC PANEL - Abnormal; Notable for the following components:   Potassium 3.3 (*)    CO2 20 (*)    Glucose, Bld 138 (*)    Calcium 8.4 (*)    Albumin 2.7 (*)    All other components within normal limits  CBC WITH DIFFERENTIAL/PLATELET - Abnormal; Notable for the following components:   WBC 20.0 (*)    RBC 3.72 (*)    Hemoglobin 10.1 (*)    HCT 32.8 (*)    Platelets 589 (*)  Neutro Abs 14.9 (*)    Monocytes Absolute 2.1 (*)    Abs Immature  Granulocytes 0.15 (*)    All other components within normal limits  URINALYSIS, ROUTINE W REFLEX MICROSCOPIC - Abnormal; Notable for the following components:   Leukocytes,Ua SMALL (*)    Bacteria, UA FEW (*)    All other components within normal limits  IRON AND TIBC - Abnormal; Notable for the following components:   Iron 9 (*)    Saturation Ratios 3 (*)    All other components within normal limits  FERRITIN - Abnormal; Notable for the following components:   Ferritin 331 (*)    All other components within normal limits  RETICULOCYTES - Abnormal; Notable for the following components:   RBC. 3.69 (*)    Immature Retic Fract 20.1 (*)    All other components within normal limits  CBG MONITORING, ED - Abnormal; Notable for the following components:   Glucose-Capillary 144 (*)    All other components within normal limits  CBG MONITORING, ED - Abnormal; Notable for the following components:   Glucose-Capillary 104 (*)    All other components within normal limits     Otherwise labs showing:    Recent Labs  Lab 09/06/18 1240 09/06/18 1245  NA 137 137  K 3.3* 3.3*  CO2 20* 20*  GLUCOSE 138* 140*  BUN 7 7  CREATININE 0.75 0.76  CALCIUM 8.4* 8.3*  MG 1.8  --   PHOS 3.4  --     Cr   stable,    Lab Results  Component Value Date   CREATININE 0.76 09/06/2018   CREATININE 0.75 09/06/2018   CREATININE 0.77 08/15/2018    Recent Labs  Lab 09/06/18 1240  AST 24  ALT 33  ALKPHOS 74  BILITOT 0.3  PROT 7.1  ALBUMIN 2.7*   Lab Results  Component Value Date   CALCIUM 8.3 (L) 09/06/2018   PHOS 3.4 09/06/2018      WBC      Component Value Date/Time   WBC 20.0 (H) 09/06/2018 1240   ANC    Component Value Date/Time   NEUTROABS 14.9 (H) 09/06/2018 1240     Plt: Lab Results  Component Value Date   PLT 589 (H) 09/06/2018     Lactic Acid, Venous    Component Value Date/Time   LATICACIDVEN 1.1 09/06/2018 1450     COVID-19 Labs    Lab Results  Component Value  Date   SARSCOV2NAA NEGATIVE 09/06/2018   SARSCOV2NAA NEGATIVE 08/11/2018     HG/HCT      Component Value Date/Time   HGB 10.1 (L) 09/06/2018 1240   HCT 32.8 (L) 09/06/2018 1240        DM  labs:  HbA1C: Recent Labs    08/11/18 2228  HGBA1C 15.0*       CBG (last 3)  Recent Labs    09/06/18 1247 09/06/18 1953  GLUCAP 144* 104*     UA  no evidence of UTI     Urine analysis:    Component Value Date/Time   COLORURINE YELLOW 09/06/2018 1627   APPEARANCEUR CLEAR 09/06/2018 1627   LABSPEC 1.006 09/06/2018 1627   PHURINE 8.0 09/06/2018 1627   GLUCOSEU NEGATIVE 09/06/2018 1627   HGBUR NEGATIVE 09/06/2018 1627   BILIRUBINUR NEGATIVE 09/06/2018 1627   KETONESUR NEGATIVE 09/06/2018 1627   PROTEINUR NEGATIVE 09/06/2018 1627   NITRITE NEGATIVE 09/06/2018 1627   LEUKOCYTESUR SMALL (A) 09/06/2018 1627     CXR -  Cavitary lesion in the left mid lung    CTA chest -  no PE, masslike opacity of the left upper lobe measuring approximately 6.6 x 5.2 cm with an internal cavitary component and air and fluid level  ECG:  Personally reviewed by me showing: HR : 128  Rhythm:  Sinus tachycardia    no evidence of ischemic changes QTC 441      ED Triage Vitals  Enc Vitals Group     BP 09/06/18 1130 (!) 141/82     Pulse Rate 09/06/18 1133 (!) 127     Resp 09/06/18 1133 (!) 98     Temp 09/06/18 1133 99.6 F (37.6 C)     Temp Source 09/06/18 1133 Oral     SpO2 09/06/18 1130 97 %     Weight 09/06/18 1338 (S) 225 lb (102.1 kg)     Height 09/06/18 1338 (S) 5' 4" (1.626 m)     Head Circumference --      Peak Flow --      Pain Score 09/06/18 1314 0     Pain Loc --      Pain Edu? --      Excl. in [place]? --   TMAX(24)@       Latest  Blood pressure 138/86, pulse (!) 105, temperature (S) (!) 100.4 F (38 C), temperature source (S) Rectal, resp. rate (!) 28, height (S) 5' 4" (1.626 m), weight (S) 102.1 kg, last menstrual period 08/09/2018, SpO2 99 %.    Hospitalist was called for  admission for pulmonary abscess   Review of Systems:    Pertinent positives include: Fevers, chills, fatigue,  Constitutional:  No weight loss, night sweats,  weight loss  HEENT:  No headaches, Difficulty swallowing,Tooth/dental problems,Sore throat,  No sneezing, itching, ear ache, nasal congestion, post nasal drip,  Cardio-vascular:  No chest pain, Orthopnea, PND, anasarca, dizziness, palpitations.no Bilateral lower extremity swelling  GI:  No heartburn, indigestion, abdominal pain, nausea, vomiting, diarrhea, change in bowel habits, loss of appetite, melena, blood in stool, hematemesis Resp:  no shortness of breath at rest. No dyspnea on exertion, No excess mucus, no productive cough, No non-productive cough, No coughing up of blood.No change in color of mucus.No wheezing. Skin:  no rash or lesions. No jaundice GU:  no dysuria, change in color of urine, no urgency or frequency. No straining to urinate.  No flank pain.  Musculoskeletal:  No joint pain or no joint swelling. No decreased range of motion. No back pain.  Psych:  No change in mood or affect. No depression or anxiety. No memory loss.  Neuro: no localizing neurological complaints, no tingling, no weakness, no double vision, no gait abnormality, no slurred speech, no confusion  All systems reviewed and apart from [place] all are negative  Past Medical History:   Past Medical History:  Diagnosis Date   Allergy    Bronchitis       History reviewed. No pertinent surgical history.  Social History:  Ambulatory  Independently      reports that she has never smoked. She has never used smokeless tobacco. She reports current alcohol use of about 1.0 standard drinks of alcohol per week. She reports that she does not use drugs.   Family History:   Family History  Problem Relation Age of Onset   Diabetes Father    Diabetes Maternal Grandmother    Cancer Neg Hx    Hypertension Neg Hx    Stroke Neg Hx  Allergies: Allergies  Allergen Reactions   Amoxicillin Other (See Comments)    Pain in back   Apple Anaphylaxis    Peaches, plums, pears, bananas, grapes, kiwi, all melons. CAN HAVE BLUEBERRIES AND STRAWBERRIES   Corn-Containing Products Anaphylaxis and Hives   Penicillins Other (See Comments)    Did it involve swelling of the face/tongue/throat, SOB, or low BP? Yes Did it involve sudden or severe rash/hives, skin peeling, or any reaction on the inside of your mouth or nose? Yes Did you need to seek medical attention at a hospital or doctor's office? Was already at hospital When did it last happen?2010 If all above answers are NO, may proceed with cephalosporin use.    Soy Allergy Anaphylaxis   Fruit & Vegetable Daily [Nutritional Supplements]     Allergy to fruits- Grapes, pears, peaches, apples, plums, bananas, kiwi, melons and mangoes.    Peanut-Containing Drug Products Hives    ALL NUTS!   Shellfish Allergy Hives, Itching and Swelling   Wheat Hives and Itching     Prior to Admission medications   Medication Sig Start Date End Date Taking? Authorizing Provider  Alpha-D-Galactosidase (BEANO PO) Take 1 tablet by mouth 2 (two) times daily as needed (gas).   Yes [provider]  amLODipine (NORVASC) 2.5 MG tablet Take 1 tablet (2.5 mg total) by mouth daily. 08/30/18  Yes Martinique, Betty G, MD  benzonatate (TESSALON) 100 MG capsule Take 1 capsule (100 mg total) by mouth 2 (two) times daily. 09/04/18  Yes Martinique, Betty G, MD  Continuous Blood Gluc Receiver (FREESTYLE LIBRE READER) DEVI 1 Device by Does not apply route as directed. 08/16/18  Yes [place]ique, Betty G, MD  Continuous Blood Gluc Sensor (FREESTYLE LIBRE SENSOR SYSTEM) MISC 1 Device by Does not apply route 3 (three) times daily as needed. 08/16/18  Yes [place], Betty G, MD  EPINEPHrine (EPIPEN) 0.3 mg/0.3 mL IJ SOAJ injection Inject 0.3 mLs (0.3 mg total) into the muscle once as needed (for severe allergic  reaction). 01/04/17  Yes Martinique, Betty G, MD  glucose blood test strip Use as instructed 09/04/18  Yes [place]ique, Betty G, MD  glucose monitoring kit (FREESTYLE) monitoring kit 1 each by Does not apply route as needed for other. 08/15/18  Yes Nita Sells, MD  HUMALOG KWIKPEN 100 UNIT/ML KwikPen Inject 6 Units as directed 3 (three) times daily with meals. 08/26/18  Yes [provider]  Insulin Glargine (BASAGLAR KWIKPEN) 100 UNIT/ML SOPN Inject 0.1 mLs (10 Units total) into the skin 2 (two) times daily. 08/30/18  Yes [place], Betty G, MD  Lancets [facility] ULTRASOFT) lancets Use as instructed 09/04/18  Yes [place]ique, Betty G, MD  levonorgestrel-ethinyl estradiol (LEVORA 0.15/30, 28,) 0.15-30 MG-MCG tablet Take 1 tablet by mouth daily. 08/16/18  Yes Martinique, Betty G, MD  metFORMIN (GLUCOPHAGE) 500 MG tablet Take 1 tablet (500 mg total) by mouth daily with breakfast. 08/30/18  Yes Martinique, Betty G, MD  pantoprazole (PROTONIX) 20 MG tablet Take 1 tablet (20 mg total) by mouth 2 (two) times daily before a meal. 08/30/18  Yes Martin[place], Betty G, MD  Insulin Aspart FlexPen 100 UNIT/ML SOPN Inject 6 Units into the skin 3 (three) times daily with meals. Patient not taking: Reported on 09/06/2018 08/15/18   Nita Sells, MD  potassium chloride SA (K-DUR) 20 MEQ tablet Take 2 tablets (40 mEq total) by mouth 2 (two) times daily. Patient not taking: Reported on 09/06/2018 08/15/18   Nita Sells, MD   Physical Exam: Blood pressure  138/86, pulse (!) 105, temperature (S) (!) 100.4 F (38 C), temperature source (S) Rectal, resp. rate (!) 28, height (S) 5' 4" (1.626 m), weight (S) 102.1 kg, last menstrual period 08/09/2018, SpO2 99 %. 1. General:  in No   Acute distress    well -appearing 2. Psychological: Alert and Oriented 3. Head/ENT:    Dry Mucous Membranes                          Head Non traumatic, neck supple                           Poor Dentition 4. SKIN: decreased Skin turgor,  Skin clean Dry  and intact no rash 5. Heart: Regular rate and rhythm no  Murmur, no Rub or gallop 6. Lungs: no wheezes or crackles   7. Abdomen: Soft,  non-tender, Non distended   obese  bowel sounds present 8. Lower extremities: no clubbing, cyanosis, no edema 9. Neurologically Grossly intact, moving all 4 extremities equally  10. MSK: Normal range of motion   All other LABS:     Recent Labs  Lab 09/06/18 1240  WBC 20.0*  NEUTROABS 14.9*  HGB 10.1*  HCT 32.8*  MCV 88.2  PLT 589*     Recent Labs  Lab 09/06/18 1240 09/06/18 1245  NA 137 137  K 3.3* 3.3*  CL 107 107  CO2 20* 20*  GLUCOSE 138* 140*  BUN 7 7  CREATININE 0.75 0.76  CALCIUM 8.4* 8.3*  MG 1.8  --   PHOS 3.4  --      Recent Labs  Lab 09/06/18 1240  AST 24  ALT 33  ALKPHOS 74  BILITOT 0.3  PROT 7.1  ALBUMIN 2.7*       Cultures:    Component Value Date/Time   SDES  08/11/2018 1456    URINE, CLEAN CATCH Performed at [facility], [place] [address] [place], [place] [address]    SPECREQUEST  08/11/2018 1456    NONE Performed at [facility], [place] [address], [place],  [address]    CULT MULTIPLE SPECIES PRESENT, SUGGEST RECOLLECTION (A) 08/11/2018 1456   REPTSTATUS 08/12/2018 FINAL 08/11/2018 1456     Radiological Exams on Admission: Ct Angio Chest Pe W And/or Wo Contrast  Result Date: 09/06/2018 CLINICAL DATA:  Cough, shortness of breath, chest pain, fever EXAM: CT ANGIOGRAPHY CHEST WITH CONTRAST TECHNIQUE: Multidetector CT imaging of the chest was performed using the standard protocol during bolus administration of intravenous contrast. Multiplanar CT image reconstructions and MIPs were obtained to evaluate the vascular anatomy. CONTRAST:  18m OMNIPAQUE IOHEXOL 350 MG/ML SOLN COMPARISON:  08/12/2018, same-day chest radiographs FINDINGS: Cardiovascular: Satisfactory opacification of the pulmonary arteries to the segmental level. No evidence of pulmonary embolism.  Normal heart size. No pericardial effusion. Mediastinum/Nodes: Enlarged AP window and left paratracheal lymph nodes, largest AP window node measuring 2.3 cm in short axis (series 5, image 71). Thyroid gland, trachea, and esophagus demonstrate no significant findings. Lungs/Pleura: There is a masslike opacity of the left upper lobe measuring approximately 6.6 x 5.2 cm with an internal cavitary component and air and fluid level, in keeping with findings of prior radiographs (series 5, image 93). No pleural effusion or pneumothorax. Upper Abdomen: No acute abnormality. Musculoskeletal: No chest wall abnormality. No acute or significant osseous findings. Review of the MIP images confirms the above findings. IMPRESSION: 1.  Negative examination for pulmonary embolism. 2. There is a masslike opacity of the left upper lobe measuring approximately 6.6 x 5.2 cm with an internal cavitary component and air and fluid level, in keeping with findings of prior radiographs (series 5, image 93). Given short interval since recent prior examination, this is likely cavitary infection. 3. Enlarged AP window and left paratracheal lymph nodes, largest AP window node measuring 2.3 cm in short axis (series 5, image 71), likely reactive in the setting of presumed infection. Electronically Signed   By: Eddie Candle M.D.   On: 09/06/2018 17:35   Dg Chest Port 1 View  Result Date: 09/06/2018 CLINICAL DATA:  Cough and congestion for several days EXAM: PORTABLE CHEST 1 VIEW COMPARISON:  08/11/2018 FINDINGS: Cardiac shadow is within normal limits. The lungs are well aerated bilaterally. There is a cavitary lesion identified in the mid left lung. No bony abnormality is noted. IMPRESSION: Cavitary lesion in the left mid lung suggestive of lung abscess. CT may be helpful for further evaluation Electronically Signed   By: Inez Catalina M.D.   On: 09/06/2018 12:11    Chart has been reviewed    Assessment/Plan  33 y.o. female with medical  history significant of Dm 2, HLD, HTN, multiple food allergies history of asthma  Admitted for pulmonary abscess  Present on Admission:   HCAP (healthcare-associated pneumonia)/ Pulmonary abscess ([place]) -Continue broad-spectrum antibiotics including cefepime metronidazole and vancomycin May benefit from ID consult. Discussed with CT surgery who feels at this point there is no indication for surgical intervention no need to transfer to [facility] Also felt there was not enough fluid to drain from the abscess.  Recommend IV antibiotics for now ordered echo to evaluate for potential embolic event   Sepsis [facility]) -   -Patient meets sepsis criteria with  fever    Tachycardia   Initial lactic acid Lactic Acid, Venous    Component Value Date/Time   LATICACIDVEN 1.1 09/06/2018 1450   Source most likely:  pneumonia,    -We will rehydrate, treat with IV antibiotics, follow lactic acid - Await results of blood and urine culture and adjust antibiotics as needed Morbid obesity will need to follow-up as an outpatient DM2 -  - Order Sensitive SSI   - continue home insulin regimen    -  check TSH and HgA1C  - Hold by mouth medications    Dyslipidemia chronic stable  GERD (gastroesophageal reflux disease) chronic stable continue home medications  Benign essential hypertension -restart home medications when able to tolerate  Hypokalemia - - will replace and repeat in AM,  check magnesium level and replace as needed Hypoalbuminemia - check prealbumin, nutrition consult Anemia -will order anemia panel denies any black stools blood in stools Hemoptysis mild currently resolved continue to monitor Other plan as per orders.  DVT prophylaxis:  SCD  Code Status:  FULL CODE   as per patient  I had personally discussed CODE STATUS with patient   Family Communication:   Family not at  Bedside   Disposition Plan:     To home once workup is complete and patient is stable                                       Consults called: Discussed with CT surgery, please call ID consult in a.m.  Admission status:  ED Disposition    ED Disposition Condition  [facility] Area: [facility] [[id number]]  Level of Care: Telemetry [5]  Admit to tele based on following criteria: Other see comments  Comments: sepsis  Covid Evaluation: N/A  Diagnosis: Pulmonary abscess ([place]) [[id number]]  Admitting Physician: Toy Baker [[id number]]  Attending Physician: Toy Baker [[id number]]  Estimated length of stay: 3 - 4 days  Certification:: I certify this patient will need inpatient services for at least 2 midnights  PT Class (Do Not Modify): Inpatient [101]  PT Acc Code (Do Not Modify): Private [1]         inpatient     Expect 2 midnight stay secondary to severity of patient's current illness including   hemodynamic instability despite optimal treatment (tachycardia   Tachypnea)   Severe lab/radiological/exam abnormalities including:  Pulmonary abscess   and extensive comorbidities including:    DM2      That are currently affecting medical management.   I expect  patient to be hospitalized for 2 midnights requiring inpatient medical care.  Patient is at high risk for adverse outcome (such as loss of life or disability) if not treated.  Indication for inpatient stay as follows:      severe pain requiring acute inpatient management,     persistent chest pain despite medical management Need for operative/procedural  intervention    Need for IV antibiotics, IV fluids,      Level of care    tele  For  24H    Precautions:  NONE   No active isolations  PPE: Used by the provider:   P100  eye Goggles,  Gloves     Sherrice Creekmore 09/06/2018, 9:45 PM    [facility]     after 2 AM please page floor coverage PA If 7AM-7PM, please contact the day team taking care of the patient using [email or web address]

## 2018-09-06 NOTE — ED Provider Notes (Signed)
1710: Hand off from previous EDPA at shift change. See previous note for details. Briefly, pt here for cough, SOB, left sided pleuritic CP x 1 month. Xray shows L cavitary lesion and labs show leukocytosis, fever. Meets sepsis criteria. Recent admission in May for DKA. Previous EDPA contacted admitting service (Dr Alvino Chapelhoi), requesting CTA for further evaluation of lesion that may need transfer to [facility]. Plan to f/u on CT and repage admitting service. Consider CT surgery/ICU based on CT findings.  Work up reviewed remarkable for WBC 20, lactic acid normal. Hgb 10.1. Mild hyperglycemia with normal AG. CT angio and blood cultures pending. She has received cefepime, flagyl and vancomycin. 1 L IVF.  Physical Exam  BP 130/80 (BP Location: Right Arm)   Pulse (!) 107   Temp (S) (!) 100.4 F (38 C) (Rectal)   Resp (!) 21   Ht (S) 5\' 4"  (1.626 m)   Wt (S) 102.1 kg   LMP 08/09/2018 (Approximate)   SpO2 100%   BMI 38.62 kg/m   Physical Exam Constitutional:      Appearance: She is well-developed. She is not toxic-appearing.  HENT:     Head: Normocephalic.     Right Ear: External ear normal.     Left Ear: External ear normal.     Nose: Nose normal.  Eyes:     Conjunctiva/sclera: Conjunctivae normal.  Neck:     Musculoskeletal: Full passive range of motion without pain.  Cardiovascular:     Rate and Rhythm: Normal rate.  Pulmonary:     Effort: Pulmonary effort is normal. Tachypnea present.     Comments: Speaking in full sentences. SpO2 normal on RA.  Musculoskeletal: Normal range of motion.  Skin:    General: Skin is warm and dry.     Capillary Refill: Capillary refill takes less than 2 seconds.  Neurological:     Mental Status: She is alert and oriented to person, place, and time.  Psychiatric:        Behavior: Behavior normal.        Thought Content: Thought content normal.     ED Course/Procedures   Clinical Course as of Sep 06 1835  Wed Sep 06, 2018  1756 IMPRESSION: 1. Negative  examination for pulmonary embolism. 2. There is a masslike opacity of the left upper lobe measuring approximately 6.6 x 5.2 cm with an internal cavitary component and air and fluid level, in keeping with findings of prior radiographs (series 5, image 93). Given short interval since recent prior examination, this is likely cavitary infection. 3. Enlarged AP window and left paratracheal lymph nodes, largest AP window node measuring 2.3 cm in short axis (series 5, image 71), likely reactive in the setting of presumed infection.  CT Angio Chest PE W and/or Wo Contrast [CG]    Clinical Course User Index [CG] Liberty HandyGibbons, Tanashia Ciesla J, PA-C    .Critical Care Performed by: Liberty HandyGibbons, Tyrian Peart J, PA-C Authorized by: Liberty HandyGibbons, Calder Oblinger J, PA-C   Critical care provider statement:    Critical care time (minutes):  45   Critical care was necessary to treat or prevent imminent or life-threatening deterioration of the following conditions:  Sepsis   Critical care was time spent personally by me on the following activities:  Discussions with consultants, evaluation of patient's response to treatment, examination of patient, ordering and performing treatments and interventions, ordering and review of laboratory studies, ordering and review of radiographic studies, pulse oximetry, re-evaluation of patient's condition, obtaining history from patient or surrogate and  review of old charts   I assumed direction of critical care for this patient from another provider in my specialty: no      MDM   1815: Evaluated pt. VS stable. NAD. Discussed CT findings and plan to admit. She is in agreement. Will repage hospitalist.  1836: Spoke to Dr Adela Glimpse who will see patient. Delsym ordered for persistent painful cough.         Liberty Handy, PA-C 09/06/18 1837    Gerhard Munch, MD 09/06/18 2229

## 2018-09-06 NOTE — ED Notes (Signed)
Patient transported to CT 

## 2018-09-06 NOTE — Telephone Encounter (Signed)
Called to screen pt for CXR to see if pt is okay to come into the office. Pt did not answer message was left for a return call

## 2018-09-06 NOTE — ED Notes (Signed)
Provider at bedside

## 2018-09-06 NOTE — ED Triage Notes (Signed)
Arrived by EMS with c/o 1 week SHOB, cough, chest pain, fever, and chest congestion. Chest pain is reproducible, increases with movement and cough. Also reports a white/yellow sputum. Tachycardic 130 temp 101.0

## 2018-09-06 NOTE — Progress Notes (Signed)
A consult was received from an ED provider for vancomycin per pharmacy dosing.  The patient's profile has been reviewed for ht/wt/allergies/indication/available labs.    A one time order has been placed for vancomycin 2000 mg.  Further antibiotics/pharmacy consults should be ordered by admitting physician if indicated.                       Thank you, Cindi Carbon, PharmD 09/06/2018  1:33 PM

## 2018-09-07 ENCOUNTER — Inpatient Hospital Stay (HOSPITAL_COMMUNITY): Payer: No Typology Code available for payment source

## 2018-09-07 DIAGNOSIS — I361 Nonrheumatic tricuspid (valve) insufficiency: Secondary | ICD-10-CM

## 2018-09-07 LAB — CBC
HCT: 32.8 % — ABNORMAL LOW (ref 36.0–46.0)
Hemoglobin: 9.6 g/dL — ABNORMAL LOW (ref 12.0–15.0)
MCH: 26.8 pg (ref 26.0–34.0)
MCHC: 29.3 g/dL — ABNORMAL LOW (ref 30.0–36.0)
MCV: 91.6 fL (ref 80.0–100.0)
Platelets: 541 10*3/uL — ABNORMAL HIGH (ref 150–400)
RBC: 3.58 MIL/uL — ABNORMAL LOW (ref 3.87–5.11)
RDW: 15.1 % (ref 11.5–15.5)
WBC: 14.7 10*3/uL — ABNORMAL HIGH (ref 4.0–10.5)
nRBC: 0 % (ref 0.0–0.2)

## 2018-09-07 LAB — COMPREHENSIVE METABOLIC PANEL
ALT: 30 U/L (ref 0–44)
AST: 21 U/L (ref 15–41)
Albumin: 2.5 g/dL — ABNORMAL LOW (ref 3.5–5.0)
Alkaline Phosphatase: 64 U/L (ref 38–126)
Anion gap: 6 (ref 5–15)
BUN: 6 mg/dL (ref 6–20)
CO2: 21 mmol/L — ABNORMAL LOW (ref 22–32)
Calcium: 8.2 mg/dL — ABNORMAL LOW (ref 8.9–10.3)
Chloride: 110 mmol/L (ref 98–111)
Creatinine, Ser: 0.74 mg/dL (ref 0.44–1.00)
GFR calc Af Amer: >60 mL/min (ref >60)
GFR calc non Af Amer: >60 mL/min (ref >60)
Glucose, Bld: 118 mg/dL — ABNORMAL HIGH (ref 70–99)
Potassium: 3.8 mmol/L (ref 3.5–5.1)
Sodium: 137 mmol/L (ref 135–145)
Total Bilirubin: 0.5 mg/dL (ref 0.3–1.2)
Total Protein: 6.5 g/dL (ref 6.5–8.1)

## 2018-09-07 LAB — GLUCOSE, CAPILLARY
Glucose-Capillary: 112 mg/dL — ABNORMAL HIGH (ref 70–99)
Glucose-Capillary: 131 mg/dL — ABNORMAL HIGH (ref 70–99)
Glucose-Capillary: 157 mg/dL — ABNORMAL HIGH (ref 70–99)
Glucose-Capillary: 86 mg/dL (ref 70–99)
Glucose-Capillary: 96 mg/dL (ref 70–99)
Glucose-Capillary: 98 mg/dL (ref 70–99)

## 2018-09-07 LAB — MAGNESIUM: Magnesium: 1.7 mg/dL (ref 1.7–2.4)

## 2018-09-07 LAB — PREALBUMIN: Prealbumin: 8.8 mg/dL — ABNORMAL LOW (ref 18–38)

## 2018-09-07 LAB — EXPECTORATED SPUTUM ASSESSMENT W GRAM STAIN, RFLX TO RESP C

## 2018-09-07 LAB — PHOSPHORUS: Phosphorus: 2.2 mg/dL — ABNORMAL LOW (ref 2.5–4.6)

## 2018-09-07 LAB — ECHOCARDIOGRAM COMPLETE
Height: 64 in
Weight: 3488 oz

## 2018-09-07 LAB — T4, FREE: Free T4: 1.12 ng/dL (ref 0.82–1.77)

## 2018-09-07 LAB — STREP PNEUMONIAE URINARY ANTIGEN: Strep Pneumo Urinary Antigen: NEGATIVE

## 2018-09-07 LAB — PROCALCITONIN: Procalcitonin: <0.1 ng/mL

## 2018-09-07 LAB — LACTIC ACID, PLASMA: Lactic Acid, Venous: 1 mmol/L (ref 0.5–1.9)

## 2018-09-07 LAB — HIV ANTIBODY (ROUTINE TESTING W REFLEX): HIV Screen 4th Generation wRfx: NONREACTIVE

## 2018-09-07 LAB — MRSA PCR SCREENING: MRSA by PCR: NEGATIVE

## 2018-09-07 LAB — TSH: TSH: 7.09 u[IU]/mL — ABNORMAL HIGH (ref 0.350–4.500)

## 2018-09-07 MED ORDER — SODIUM CHLORIDE 0.9 % IV SOLN: INTRAVENOUS | Status: DC | PRN

## 2018-09-07 MED ORDER — LEVONORGESTREL-ETHINYL ESTRAD 0.15-30 MG-MCG PO TABS
1.0000 | ORAL_TABLET | Freq: Every day | ORAL | Status: DC
  Administered 2018-09-07 – 2018-09-08 (×2): 1 via ORAL

## 2018-09-07 MED ORDER — INSULIN ASPART 100 UNIT/ML ~~LOC~~ SOLN
0.0000 [IU] | Freq: Three times a day (TID) | SUBCUTANEOUS | Status: DC
  Administered 2018-09-07: 2 [IU] via SUBCUTANEOUS

## 2018-09-07 NOTE — Progress Notes (Signed)
  Echocardiogram 2D Echocardiogram has been performed.  Tye Savoy 09/07/2018, 9:13 AM

## 2018-09-07 NOTE — Progress Notes (Addendum)
Initial Nutrition Assessment  DOCUMENTATION CODES:   Obesity unspecified  INTERVENTION:   Unable to provide nutritional supplements given allergies.  NUTRITION DIAGNOSIS:   Inadequate oral intake related to nausea, poor appetite as evidenced by per patient/family report.  GOAL:   Patient will meet greater than or equal to 90% of their needs  MONITOR:   PO intake, Supplement acceptance, Labs, Weight trends, I & O's  REASON FOR ASSESSMENT:   Consult Assessment of nutrition requirement/status  ASSESSMENT:    33 y.o. female with medical history significant of Dm 2, HLD, HTN, multiple food allergies history of asthma. Admitted for cough and fever.  **RD working remotely**  Patient was recently admitted in May 2020 for DKA and was provided diabetes diet education during that admission. Pt reports having some nausea since last discharge. No PO documented at this time. Unable to provide nutritional supplements as pt has reported allergy.  Per weight records, pt has lost 5 lbs since 08/11/18 (2% wt loss x 4 weeks, insignificant for time frame).  Medications reviewed. Labs reviewed: CBGs: 86-131 Low Phos   NUTRITION - FOCUSED PHYSICAL EXAM:  Unable to perform per department requirements to work remotely.  Diet Order:   Diet Order            Diet Carb Modified Fluid consistency: Thin; Room service appropriate? Yes  Diet effective now              EDUCATION NEEDS:   No education needs have been identified at this time  Skin:  Skin Assessment: Reviewed RN Assessment  Last BM:  6/4  Height:   Ht Readings from Last 1 Encounters:  09/06/18 5\' 4"  (1.626 m)    Weight:   Wt Readings from Last 1 Encounters:  09/06/18 98.9 kg    Ideal Body Weight:  54.5 kg  BMI:  Body mass index is 37.42 kg/m.  Estimated Nutritional Needs:   Kcal:  1650-1850  Protein:  65-75g  Fluid:  1.8L/day  Tilda Franco, MS, RD, LDN [facility] Inpatient Clinical  Dietitian Pager: [phone] After Hours Pager: [phone]

## 2018-09-07 NOTE — Progress Notes (Signed)
PROGRESS NOTE    Kathleen [name]  ZOX:[id number] DOB: 1985-08-12 DOA: 09/06/2018 PCP: [place], Betty G, MD     Brief Narrative:  Kathleen [name] is a 33 year old female with past medical history significant for type 2 diabetes, hyperlipidemia, hypertension, asthma, morbid obesity who was recently discharged from the hospital after treatment of DKA who now presents with cough and fever.  During her previous admission, she was found at the time to have pneumomediastinum thought to be secondary to excessive vomiting and coughing.  She states that she is continued to cough since her discharge from the hospital.  She denies any IV illicit drug use, admits to marijuana only.  In the emergency department, she was found to have sepsis with fever, tachycardia and tachypnea.  Chest x-ray revealed cavitary lesion and CT chest confirmed large pulmonary abscess.  At time of admission, case was discussed with cardiothoracic surgery who felt that there was no indication for surgical intervention at this time.  New events last 24 hours / Subjective: Afebrile overnight.  She states that she feels better today, although continues to cough.  Has some sharp pain with deep breath.  Remains on room air  Assessment & Plan:   Active Problems:   Morbid obesity with BMI of 40.0-44.9, adult (HCC)   Type 2 diabetes mellitus without complications (HCC)   Dyslipidemia   GERD (gastroesophageal reflux disease)   Benign essential hypertension   HCAP (healthcare-associated pneumonia)   Pulmonary abscess (HCC)   Sepsis (HCC)   Hypokalemia   Hypoalbuminemia   Hemoptysis  Sepsis secondary to pulmonary abscess, sepsis present on admission -Continue vancomycin, cefepime, metronidazole.  MRSA PCR pending, if negative, discontinue vancomycin -Case discussed with cardiothoracic surgery at time of admission, who did not feel there was indication for surgical intervention at this time -Echocardiogram  pending -SARS-CoV-2 negative -Blood cultures pending -Respiratory culture pending -Leukocytosis improving, 14.7 this morning  Type 2 diabetes -Hemoglobin A1c 15 previous admission -Lantus, SSI Novolog   Elevated TSH -Check free T4  GERD -Continue PPI    DVT prophylaxis: SCD Code Status: Full Family Communication: None Disposition Plan: Pending further improvement of her sepsis pathology, hopeful home discharge 6/5   Consultants:   None  Procedures:   None  Antimicrobials:  Anti-infectives (From admission, onward)   Start     Dose/Rate Route Frequency Ordered Stop   09/07/18 0600  vancomycin (VANCOCIN) IVPB 1000 mg/200 mL premix     1,000 mg 200 mL/hr over 60 Minutes Intravenous Every 12 hours 09/06/18 2149     09/06/18 2200  metroNIDAZOLE (FLAGYL) IVPB 500 mg     500 mg 100 mL/hr over 60 Minutes Intravenous Every 8 hours 09/06/18 2144     09/06/18 2200  ceFEPIme (MAXIPIME) 2 g in sodium chloride 0.9 % 100 mL IVPB     2 g 200 mL/hr over 30 Minutes Intravenous Every 8 hours 09/06/18 2148     09/06/18 1515  metroNIDAZOLE (FLAGYL) IVPB 500 mg     500 mg 100 mL/hr over 60 Minutes Intravenous  Once 09/06/18 1512 09/06/18 1853   09/06/18 1345  vancomycin (VANCOCIN) 2,000 mg in sodium chloride 0.9 % 500 mL IVPB     2,000 mg 250 mL/hr over 120 Minutes Intravenous  Once 09/06/18 1329 09/06/18 1652   09/06/18 1330  ceFEPIme (MAXIPIME) 1 g in sodium chloride 0.9 % 100 mL IVPB     1 g 200 mL/hr over 30 Minutes Intravenous  Once 09/06/18 1322 09/06/18 1537  Objective: Vitals:   09/06/18 2030 09/06/18 2127 09/06/18 2154 09/07/18 0503  BP: 132/80 121/76 136/85 133/76  Pulse: (!) 105 (!) 104 (!) 107 92  Resp: 20 20 18 18   Temp:  98.7 F (37.1 C) 98.7 F (37.1 C) 98.2 F (36.8 C)  TempSrc:  Oral Oral Oral  SpO2: 97% 100% 98% 99%  Weight:   98.9 kg   Height:   5\' 4"  (1.626 m)     Intake/Output Summary (Last 24 hours) at 09/07/2018 1258 Last data filed at  09/07/2018 0736 Gross per 24 hour  Intake 2538.75 ml  Output --  Net 2538.75 ml   Filed Weights   09/06/18 1338 09/06/18 2154  Weight: (S) 102.1 kg 98.9 kg    Examination:  General exam: Appears calm and comfortable  Respiratory system: Clear to auscultation. Respiratory effort normal. Cardiovascular system: S1 & S2 heard, tachycardic, regular. No JVD, murmurs, rubs, gallops or clicks. No pedal edema. Gastrointestinal system: Abdomen is nondistended, soft and nontender. No organomegaly or masses felt. Normal bowel sounds heard. Central nervous system: Alert and oriented. No focal neurological deficits. Extremities: Symmetric 5 x 5 power. Skin: No rashes, lesions or ulcers Psychiatry: Judgement and insight appear normal. Mood & affect appropriate.   Data Reviewed: I have personally reviewed following labs and imaging studies  CBC: Recent Labs  Lab 09/06/18 1240 09/07/18 0038  WBC 20.0* 14.7*  NEUTROABS 14.9*  --   HGB 10.1* 9.6*  HCT 32.8* 32.8*  MCV 88.2 91.6  PLT 589* 541*   Basic Metabolic Panel: Recent Labs  Lab 09/06/18 1240 09/06/18 1245 09/07/18 0038  NA 137 137 137  K 3.3* 3.3* 3.8  CL 107 107 110  CO2 20* 20* 21*  GLUCOSE 138* 140* 118*  BUN 7 7 6   CREATININE 0.75 0.76 0.74  CALCIUM 8.4* 8.3* 8.2*  MG 1.8  --  1.7  PHOS 3.4  --  2.2*   GFR: Estimated Creatinine Clearance: 116.5 mL/min (by C-G formula based on SCr of 0.74 mg/dL). Liver Function Tests: Recent Labs  Lab 09/06/18 1240 09/07/18 0038  AST 24 21  ALT 33 30  ALKPHOS 74 64  BILITOT 0.3 0.5  PROT 7.1 6.5  ALBUMIN 2.7* 2.5*   No results for input(s): LIPASE, AMYLASE in the last 168 hours. No results for input(s): AMMONIA in the last 168 hours. Coagulation Profile: Recent Labs  Lab 09/06/18 2202  INR 1.1   Cardiac Enzymes: No results for input(s): CKTOTAL, CKMB, CKMBINDEX, TROPONINI in the last 168 hours. BNP (last 3 results) No results for input(s): PROBNP in the last 8760  hours. HbA1C: No results for input(s): HGBA1C in the last 72 hours. CBG: Recent Labs  Lab 09/06/18 2156 09/07/18 0005 09/07/18 0408 09/07/18 0732 09/07/18 1155  GLUCAP 105* 112* 96 86 131*   Lipid Profile: No results for input(s): CHOL, HDL, LDLCALC, TRIG, CHOLHDL, LDLDIRECT in the last 72 hours. Thyroid Function Tests: Recent Labs    09/07/18 0038  TSH 7.090*   Anemia Panel: Recent Labs    09/06/18 1240  VITAMINB12 208  FOLATE 10.1  FERRITIN 331*  TIBC 302  IRON 9*  RETICCTPCT 2.1   Sepsis Labs: Recent Labs  Lab 09/06/18 1245 09/06/18 1450 09/06/18 2202 09/07/18 0038  PROCALCITON  --   --  <0.10  --   LATICACIDVEN 1.0 1.1 1.0 1.0    Recent Results (from the past 240 hour(s))  Blood Culture (routine x 2)     Status: None (Preliminary  result)   Collection Time: 09/06/18 12:40 PM  Result Value Ref Range Status   Specimen Description   Final    BLOOD LEFT FOREARM Performed at [facility], [address], [place], [place] [address]    Special Requests   Final    BOTTLES DRAWN AEROBIC AND ANAEROBIC Blood Culture results may not be optimal due to an excessive volume of blood received in culture bottles Performed at [facility], [address] [place], [place] [address]    Culture   Final    NO GROWTH < 24 HOURS Performed at [facility], [address] [place], [place] [address]    Report Status PENDING  Incomplete  SARS Coronavirus 2 [facility] order, Performed in [facility] hospital lab)     Status: None   Collection Time: 09/06/18 12:45 PM  Result Value Ref Range Status   SARS Coronavirus 2 NEGATIVE NEGATIVE Final    Comment: (NOTE) If result is NEGATIVE SARS-CoV-2 target nucleic acids are NOT DETECTED. The SARS-CoV-2 RNA is generally detectable in upper and lower  respiratory specimens during the acute phase of infection. The lowest  concentration of SARS-CoV-2 viral copies this assay can detect is 250   copies / mL. A negative result does not preclude SARS-CoV-2 infection  and should not be used as the sole basis for treatment or other  patient management decisions.  A negative result may occur with  improper specimen collection / handling, submission of specimen other  than nasopharyngeal swab, presence of viral mutation(s) within the  areas targeted by this assay, and inadequate number of viral copies  (<250 copies / mL). A negative result must be combined with clinical  observations, patient history, and epidemiological information. If result is POSITIVE SARS-CoV-2 target nucleic acids are DETECTED. The SARS-CoV-2 RNA is generally detectable in upper and lower  respiratory specimens dur ing the acute phase of infection.  Positive  results are indicative of active infection with SARS-CoV-2.  Clinical  correlation with patient history and other diagnostic information is  necessary to determine patient infection status.  Positive results do  not rule out bacterial infection or co-infection with other viruses. If result is PRESUMPTIVE POSTIVE SARS-CoV-2 nucleic acids MAY BE PRESENT.   A presumptive positive result was obtained on the submitted specimen  and confirmed on repeat testing.  While 2019 novel coronavirus  (SARS-CoV-2) nucleic acids may be present in the submitted sample  additional confirmatory testing may be necessary for epidemiological  and / or clinical management purposes  to differentiate between  SARS-CoV-2 and other Sarbecovirus currently known to infect humans.  If clinically indicated additional testing with an alternate test  methodology [phone]) is advised. The SARS-CoV-2 RNA is generally  detectable in upper and lower respiratory sp ecimens during the acute  phase of infection. The expected result is Negative. Fact Sheet for Patients:  BoilerBrush.com.cy Fact Sheet for Healthcare  Providers: https://pope.com/ This test is not yet approved or cleared by the [place] FDA and has been authorized for detection and/or diagnosis of SARS-CoV-2 by FDA under an Emergency Use Authorization (EUA).  This EUA will remain in effect (meaning this test can be used) for the duration of the COVID-19 declaration under Section 564(b)(1) of the Act, 21 U.S.C. section 360bbb-3(b)(1), unless the authorization is terminated or revoked sooner. Performed at [facility], [address] [place], [place] [address]   Blood Culture (routine x 2)     Status: None (  Preliminary result)   Collection Time: 09/06/18  2:45 PM  Result Value Ref Range Status   Specimen Description   Final    BLOOD LEFT ARM Performed at [facility], [address], [place], [place] [address]    Special Requests   Final    BOTTLES DRAWN AEROBIC AND ANAEROBIC Blood Culture results may not be optimal due to an excessive volume of blood received in culture bottles Performed at [facility], [address] [place], [place] [address]    Culture   Final    NO GROWTH < 12 HOURS Performed at [facility], [address], [place], [place] [address]    Report Status PENDING  Incomplete  Culture, sputum-assessment     Status: None   Collection Time: 09/06/18  9:45 PM  Result Value Ref Range Status   Specimen Description SPUTUM  Final   Special Requests Immunocompromised  Final   Sputum evaluation   Final    THIS SPECIMEN IS ACCEPTABLE FOR SPUTUM CULTURE Performed at [facility], [address] [place], [place] [address]    Report Status 09/07/2018 FINAL  Final  Culture, respiratory     Status: None (Preliminary result)   Collection Time: 09/06/18  9:45 PM  Result Value Ref Range Status   Specimen Description   Final    SPUTUM Performed at [facility], [address], [place],  [place] [address]    Special Requests   Final    Immunocompromised Reflexed from [id number] Performed at [facility], [address], [place], [place] [address]    Gram Stain   Final    ABUNDANT WBC PRESENT, PREDOMINANTLY PMN MODERATE GRAM NEGATIVE RODS MODERATE GRAM POSITIVE COCCI MODERATE GRAM POSITIVE RODS Performed at [facility], [address], [place], [place] [address]    Culture PENDING  Incomplete   Report Status PENDING  Incomplete       Radiology Studies: Ct Angio Chest Pe W And/or Wo Contrast  Result Date: 09/06/2018 CLINICAL DATA:  Cough, shortness of breath, chest pain, fever EXAM: CT ANGIOGRAPHY CHEST WITH CONTRAST TECHNIQUE: Multidetector CT imaging of the chest was performed using the standard protocol during bolus administration of intravenous contrast. Multiplanar CT image reconstructions and MIPs were obtained to evaluate the vascular anatomy. CONTRAST:  OMNIPAQUE IOHEXOL 350 MG/ML SOLN COMPARISON:  08/12/2018, same-day chest radiographs FINDINGS: Cardiovascular: Satisfactory opacification of the pulmonary arteries to the segmental level. No evidence of pulmonary embolism. Normal heart size. No pericardial effusion. Mediastinum/Nodes: Enlarged AP window and left paratracheal lymph nodes, largest AP window node measuring 2.3 cm in short axis (series 5, image 71). Thyroid gland, trachea, and esophagus demonstrate no significant findings. Lungs/Pleura: There is a masslike opacity of the left upper lobe measuring approximately 6.6 x 5.2 cm with an internal cavitary component and air and fluid level, in keeping with findings of prior radiographs (series 5, image 93). No pleural effusion or pneumothorax. Upper Abdomen: No acute abnormality. Musculoskeletal: No chest wall abnormality. No acute or significant osseous findings. Review of the MIP images confirms the above findings. IMPRESSION: 1.  Negative examination for pulmonary embolism. 2. There is a masslike  opacity of the left upper lobe measuring approximately 6.6 x 5.2 cm with an internal cavitary component and air and fluid level, in keeping with findings of prior radiographs (series 5, image 93). Given short interval since recent prior examination, this is likely cavitary infection. 3. Enlarged  AP window and left paratracheal lymph nodes, largest AP window node measuring 2.3 cm in short axis (series 5, image 71), likely reactive in the setting of presumed infection. Electronically Signed   By: Lauralyn Primes M.D.   On: 09/06/2018 17:35   Dg Chest Port 1 View  Result Date: 09/06/2018 CLINICAL DATA:  Cough and congestion for several days EXAM: PORTABLE CHEST 1 VIEW COMPARISON:  08/11/2018 FINDINGS: Cardiac shadow is within normal limits. The lungs are well aerated bilaterally. There is a cavitary lesion identified in the mid left lung. No bony abnormality is noted. IMPRESSION: Cavitary lesion in the left mid lung suggestive of lung abscess. CT may be helpful for further evaluation Electronically Signed   By: Alcide Clever M.D.   On: 09/06/2018 12:11      Scheduled Meds:  dextromethorphan  30 mg Oral BID   insulin aspart  0-9 Units Subcutaneous TID WC   insulin glargine  10 Units Subcutaneous BID   levonorgestrel-ethinyl estradiol  1 tablet Oral Daily   pantoprazole  20 mg Oral BID AC   Continuous Infusions:  ceFEPime (MAXIPIME) IV 2 g (09/07/18 0543)   metronidazole 500 mg (09/07/18 0627)   vancomycin 1,000 mg (09/07/18 0730)     LOS: 1 day    Time spent: 35 minutes   Noralee Stain, DO [facility] [email or web address] 09/07/2018, 12:58 PM

## 2018-09-08 ENCOUNTER — Telehealth: Payer: Self-pay | Admitting: Family Medicine

## 2018-09-08 LAB — CBC
HCT: 33.1 % — ABNORMAL LOW (ref 36.0–46.0)
Hemoglobin: 10.2 g/dL — ABNORMAL LOW (ref 12.0–15.0)
MCH: 27.7 pg (ref 26.0–34.0)
MCHC: 30.8 g/dL (ref 30.0–36.0)
MCV: 89.9 fL (ref 80.0–100.0)
Platelets: 580 10*3/uL — ABNORMAL HIGH (ref 150–400)
RBC: 3.68 MIL/uL — ABNORMAL LOW (ref 3.87–5.11)
RDW: 15.1 % (ref 11.5–15.5)
WBC: 11 10*3/uL — ABNORMAL HIGH (ref 4.0–10.5)
nRBC: 0 % (ref 0.0–0.2)

## 2018-09-08 LAB — BASIC METABOLIC PANEL
Anion gap: 10 (ref 5–15)
BUN: 6 mg/dL (ref 6–20)
CO2: 17 mmol/L — ABNORMAL LOW (ref 22–32)
Calcium: 8.4 mg/dL — ABNORMAL LOW (ref 8.9–10.3)
Chloride: 111 mmol/L (ref 98–111)
Creatinine, Ser: 0.73 mg/dL (ref 0.44–1.00)
GFR calc Af Amer: >60 mL/min (ref >60)
GFR calc non Af Amer: >60 mL/min (ref >60)
Glucose, Bld: 101 mg/dL — ABNORMAL HIGH (ref 70–99)
Potassium: 3.5 mmol/L (ref 3.5–5.1)
Sodium: 138 mmol/L (ref 135–145)

## 2018-09-08 LAB — HEMOGLOBIN A1C
Hgb A1c MFr Bld: 11.3 % — ABNORMAL HIGH (ref 4.8–5.6)
Mean Plasma Glucose: 278 mg/dL

## 2018-09-08 LAB — GLUCOSE, CAPILLARY: Glucose-Capillary: 86 mg/dL (ref 70–99)

## 2018-09-08 MED ORDER — ACETAMINOPHEN 325 MG PO TABS
650.0000 mg | ORAL_TABLET | Freq: Four times a day (QID) | ORAL | Status: DC | PRN
  Administered 2018-09-08: 650 mg via ORAL
  Filled 2018-09-08: qty 2

## 2018-09-08 MED ORDER — METRONIDAZOLE 500 MG PO TABS: 500.0000 mg | ORAL_TABLET | Freq: Three times a day (TID) | ORAL | 0 refills | Status: AC

## 2018-09-08 MED ORDER — DEXTROMETHORPHAN POLISTIREX ER 30 MG/5ML PO SUER: 30.0000 mg | Freq: Two times a day (BID) | ORAL | 0 refills | Status: DC

## 2018-09-08 MED ORDER — CEFDINIR 300 MG PO CAPS: 300.0000 mg | ORAL_CAPSULE | Freq: Two times a day (BID) | ORAL | 0 refills | Status: AC

## 2018-09-08 NOTE — Progress Notes (Signed)
Pt called at 1510 and said that her prescription for Delsym was not at the pharmacy. Dr. Alvino Chapel  was  Aware through e-text.

## 2018-09-08 NOTE — Consult Note (Signed)
NAME:  Kathleen [name], MRN:  [id number], DOB:  [birth year], LOS: 2 ADMISSION DATE:  09/06/2018, CONSULTATION DATE:  09/08/18 REFERRING MD:  Dr. Maylene Roes, CHIEF COMPLAINT:  SOB   Brief History   33 y/o F admitted with cough and fever.  CT in 08/2018 did not show LUL airspace disease. CTA 6/3 was neg for PE but demonstrated new LUL cavitary lesion.    History of present illness   33 y/o F who presented to [facility] on 6/3 via EMS with reports of one week history of increasing shortness of breath, cough with thick white / yellow sputum production, tachycardia, chest pain and fever.  She reported chest pain was worse with movement.    The patient was recently admitted from 5/8-5/12 for severe DKA.  Hospital course at that time notable for pneumomediastinum based on CXR thought related to nausea/vomiting.  CT of the chest did not identify causative etiology (5/9).   She returned 6/3 with above complaints.  The patient was admitted per [facility] for further evaluation. Follow up CTA of the chest was completed which was negative for PE but showed a new  LUL 6.6 x 5.2 cm cavitary lesion with air fluid level, enlarged AP window and left paratracheal lymph nodes - likely reactive in setting of infection.    PCCM called to see the patient but she left AMA before evaluation.   Past Medical History  Morbid Obesity - BMI 37 DM II  Hyperlipidemia  GERD HTN  Significant Hospital Events   6/03 Admit  6/05 PCCM consulted   Consults:  PCCM   Procedures:    Significant Diagnostic Tests:  ECHO 6/4 >> LVEF 60-65%, normal cavity size, RV normal systolic function, RA pressure estimated at 10,  CTA Chest 6/5 >> negative for PE, LUL 6.6 x 5.2 cm cavitary lesion with air fluid level, enlarged AP window and left paratracheal lymph nodes - likely reactive in setting of infection   Micro Data:  COVID 6/3 >> negative  MRSA PCR 6/4 >> negative  BCx2 6/3 >> Tracheal aspirate 6/3 >>   Antimicrobials:  Cefepime 6/4 >>   Flagyl 6/4 >>   Interim history/subjective:  Unable to complete as patient left AMA  Objective   Blood pressure 135/86, pulse (!) 101, temperature 98 F (36.7 C), temperature source Oral, resp. rate 18, height _0  (1.626 m), weight 98.9 kg, last menstrual period 08/09/2018, SpO2 99 %.        Intake/Output Summary (Last 24 hours) at 09/08/2018 1137 Last data filed at 09/08/2018 4944 Gross per 24 hour  Intake 937.23 ml  Output -  Net 937.23 ml   Filed Weights   09/06/18 1338 09/06/18 2154  Weight: (S) 102.1 kg 98.9 kg    Examination: Unable to examine patient as she left [facility] Problem list      Assessment & Plan:   LUL Cavitary Lesion  -in setting of poorly controlled DM, Hgb A1c 11.3, hypothyroidism, questionable medical non-compliance  -DDx includes suspect aspiration event from recent admit with AMS/DKA with N/V, doubt TB given how quickly it evolved and no other findings to support Recent Pneumomediastinum  -resolved on CT  P: PCCM will be happy to evaluate the patient in the office if she wishes to be seen  No charge visit as she was not evaluated > pt left AMA  She needs follow up imaging after prolonged therapy with abx to ensure resolution of cavitary lesion  Best practice:  Diet: Pain/Anxiety/Delirium  protocol (if indicated):  VAP protocol (if indicated):  DVT prophylaxis:  GI prophylaxis Glucose control:  Mobility:  Code Status:  Family Communication:  Disposition:   Labs   CBC: Recent Labs  Lab 09/06/18 1240 09/07/18 0038 09/08/18 0545  WBC 20.0* 14.7* 11.0*  NEUTROABS 14.9*  --   --   HGB 10.1* 9.6* 10.2*  HCT 32.8* 32.8* 33.1*  MCV 88.2 91.6 89.9  PLT 589* 541* 580*    Basic Metabolic Panel: Recent Labs  Lab 09/06/18 1240 09/06/18 1245 09/07/18 0038 09/08/18 0545  NA 137 137 137 138  K 3.3* 3.3* 3.8 3.5  CL 107 107 110 111  CO2 20* 20* 21* 17*  GLUCOSE 138* 140* 118* 101*  BUN _0 CREATININE 0.75 0.76  0.74 0.73  CALCIUM 8.4* 8.3* 8.2* 8.4*  MG 1.8  --  1.7  --   PHOS 3.4  --  2.2*  --    GFR: Estimated Creatinine Clearance: 116.5 mL/min (by C-G formula based on SCr of 0.73 mg/dL). Recent Labs  Lab 09/06/18 1240 09/06/18 1245 09/06/18 1450 09/06/18 2202 09/07/18 0038 09/08/18 0545  PROCALCITON  --   --   --  <0.10  --   --   WBC 20.0*  --   --   --  14.7* 11.0*  LATICACIDVEN  --  1.0 1.1 1.0 1.0  --     Liver Function Tests: Recent Labs  Lab 09/06/18 1240 09/07/18 0038  AST 24 21  ALT 33 30  ALKPHOS 74 64  BILITOT 0.3 0.5  PROT 7.1 6.5  ALBUMIN 2.7* 2.5*   No results for input(s): LIPASE, AMYLASE in the last 168 hours. No results for input(s): AMMONIA in the last 168 hours.  ABG    Component Value Date/Time   PHART 7.427 08/12/2018 0744   PCO2ART 28.8 (L) 08/12/2018 0744   PO2ART 123 (H) 08/12/2018 0744   HCO3 18.7 (L) 08/12/2018 0744   ACIDBASEDEF 4.2 (H) 08/12/2018 0744   O2SAT 99.2 08/12/2018 0744     Coagulation Profile: Recent Labs  Lab 09/06/18 2202  INR 1.1    Cardiac Enzymes: No results for input(s): CKTOTAL, CKMB, CKMBINDEX, TROPONINI in the last 168 hours.  HbA1C: Hgb A1c MFr Bld  Date/Time Value Ref Range Status  09/06/2018 10:02 PM 11.3 (H) 4.8 - 5.6 % Final    Comment:    (NOTE)         Prediabetes: 5.7 - 6.4         Diabetes: >6.4         Glycemic control for adults with diabetes: <7.0   08/11/2018 10:28 PM 15.0 (H) 4.8 - 5.6 % Final    Comment:    (NOTE)         Prediabetes: 5.7 - 6.4         Diabetes: >6.4         Glycemic control for adults with diabetes: <7.0     CBG: Recent Labs  Lab 09/07/18 0732 09/07/18 1155 09/07/18 1642 09/07/18 2047 09/08/18 0752  GLUCAP 86 131* 157* 98 86    Review of Systems: Positives in [place] - unable to complete as pt left AMA  Gen: Denies fever, chills, weight change, fatigue, night sweats HEENT: Denies blurred vision, double vision, hearing loss, tinnitus, sinus congestion,  rhinorrhea, sore throat, neck stiffness, dysphagia PULM: Denies shortness of breath, cough, sputum production, hemoptysis, wheezing CV: Denies chest pain, edema, orthopnea, paroxysmal nocturnal dyspnea, palpitations GI: Denies  abdominal pain, nausea, vomiting, diarrhea, hematochezia, melena, constipation, change in bowel habits GU: Denies dysuria, hematuria, polyuria, oliguria, urethral discharge Endocrine: Denies hot or cold intolerance, polyuria, polyphagia or appetite change Derm: Denies rash, dry skin, scaling or peeling skin change Heme: Denies easy bruising, bleeding, bleeding gums Neuro: Denies headache, numbness, weakness, slurred speech, loss of memory or consciousness   Past Medical History  She,  has a past medical history of Allergy and Bronchitis.   Surgical History   History reviewed. No pertinent surgical history.   Social History   reports that she has never smoked. She has never used smokeless tobacco. She reports current alcohol use of about 1.0 standard drinks of alcohol per week. She reports that she does not use drugs.   Family History   Her family history includes Diabetes in her father and maternal grandmother. There is no history of Cancer, Hypertension, or Stroke.   Allergies Allergies  Allergen Reactions  . Amoxicillin Other (See Comments)    Pain in back  . Apple Anaphylaxis    Peaches, plums, pears, bananas, grapes, kiwi, all melons. CAN HAVE BLUEBERRIES AND STRAWBERRIES  . Corn-Containing Products Anaphylaxis and Hives  . Penicillins Other (See Comments)    Did it involve swelling of the face/tongue/throat, SOB, or low BP? Yes Did it involve sudden or severe rash/hives, skin peeling, or any reaction on the inside of your mouth or nose? Yes Did you need to seek medical attention at a hospital or doctor's office? Was already at hospital When did it last happen?2010 If all above answers are "NO", may proceed with cephalosporin use.   . Soy Allergy  Anaphylaxis  . Fruit & Vegetable Daily [Nutritional Supplements]     Allergy to fruits- Grapes, pears, peaches, apples, plums, bananas, kiwi, melons and mangoes.   . Peanut-Containing Drug Products Hives    ALL NUTS!  . Shellfish Allergy Hives, Itching and Swelling  . Wheat Hives and Itching     Home Medications  Prior to Admission medications   Medication Sig Start Date End Date Taking? Authorizing Provider  Alpha-D-Galactosidase (BEANO PO) Take 1 tablet by mouth 2 (two) times daily as needed (gas).   Yes [provider]  amLODipine (NORVASC) 2.5 MG tablet Take 1 tablet (2.5 mg total) by mouth daily. 08/30/18  Yes [place], Betty G, MD  benzonatate (TESSALON) 100 MG capsule Take 1 capsule (100 mg total) by mouth 2 (two) times daily. 09/04/18  Yes [place], Betty G, MD  Continuous Blood Gluc Receiver (FREESTYLE LIBRE READER) DEVI 1 Device by Does not apply route as directed. 08/16/18  Yes [place], Betty G, MD  Continuous Blood Gluc Sensor (FREESTYLE LIBRE SENSOR SYSTEM) MISC 1 Device by Does not apply route 3 (three) times daily as needed. 08/16/18  Yes [place], Betty G, MD  EPINEPHrine (EPIPEN) 0.3 mg/0.3 mL IJ SOAJ injection Inject 0.3 mLs (0.3 mg total) into the muscle once as needed (for severe allergic reaction). 01/04/17  Yes [place], Betty G, MD  glucose blood test strip Use as instructed 09/04/18  Yes [place], Betty G, MD  glucose monitoring kit (FREESTYLE) monitoring kit 1 each by Does not apply route as needed for other. 08/15/18  Yes Nita Sells, MD  HUMALOG KWIKPEN 100 UNIT/ML KwikPen Inject 6 Units as directed 3 (three) times daily with meals. 08/26/18  Yes [provider]  Insulin Glargine (BASAGLAR KWIKPEN) 100 UNIT/ML SOPN Inject 0.1 mLs (10 Units total) into the skin 2 (two) times daily. 08/30/18  Yes [place], Betty  G, MD  Lancets (ONETOUCH ULTRASOFT) lancets Use as instructed 09/04/18  Yes [place], Betty G, MD  levonorgestrel-ethinyl estradiol (LEVORA 0.15/30, 28,)  0.15-30 MG-MCG tablet Take 1 tablet by mouth daily. 08/16/18  Yes [place], Betty G, MD  metFORMIN (GLUCOPHAGE) 500 MG tablet Take 1 tablet (500 mg total) by mouth daily with breakfast. 08/30/18  Yes [place], Betty G, MD  pantoprazole (PROTONIX) 20 MG tablet Take 1 tablet (20 mg total) by mouth 2 (two) times daily before a meal. 08/30/18  Yes [place], Betty G, MD  Insulin Aspart FlexPen 100 UNIT/ML SOPN Inject 6 Units into the skin 3 (three) times daily with meals. Patient not taking: Reported on 09/06/2018 08/15/18   Nita Sells, MD  potassium chloride SA (K-DUR) 20 MEQ tablet Take 2 tablets (40 mEq total) by mouth 2 (two) times daily. Patient not taking: Reported on 09/06/2018 08/15/18   Nita Sells, MD      No charge visit. Pt left AMA before she could be seen   Noe Gens, NP-C [facility] Pgr: [phone] or if no answer [phone] 09/08/2018, 11:37 AM

## 2018-09-08 NOTE — Progress Notes (Signed)
Pt left AMA. Pt signed AMA form and verbalized an understanding of its content. Pt left with all belongings and had no questions.

## 2018-09-08 NOTE — Telephone Encounter (Signed)
Copied from CRM [id number]. Topic: Quick Communication - Rx Refill/Question >> Sep 08, 2018  4:26 PM Saverio Danker J wrote: Medication: dextromethorphan (DELSYM) 30 MG/5ML liquid  Has the patient contacted their pharmacy? No. (Agent: If no, request that the patient contact the pharmacy for the refill.) (Agent: If yes, when and what did the pharmacy advise?)  Preferred Pharmacy (with phone number or street name): [email or web address] Pt has infection in her chest, and she is still coughing. The hospital was supposed to send this in but pharmacy says they dont have it. She is calling the hospital but wanted to ask you for it in case they can not fill it  Pt also needs to have a TB test done. She discharged herself because they wanted to keep her another week to get a tb test.  Pt says she feel that she does not have tb.   [phone] or [phone]  Agent: Please be advised that RX refills may take up to 3 business days. We ask that you follow-up with your pharmacy.

## 2018-09-08 NOTE — Discharge Summary (Signed)
Physician Discharge Summary  Kathleen [name] IWP:[id number] DOB: 1985/04/24 DOA: 09/06/2018  PCP: Martinique, Betty G, MD  Admit date: 09/06/2018 Discharge date: 09/08/2018  Admitted From: Home Disposition:  Home   PATIENT LEFT AGAINST MEDICAL ADVICE  Recommendations for Outpatient Follow-up:  1. Follow up with PCP in 1 week 2. Recommend TB work up as outpatient  3. Follow up with Pulmonology for outpatient bronchoscopy 4. Recommend repeat chest imaging in 4-6 weeks 5. Follow up final blood culture results, respiratory culture results 6. Recommend repeat TSH in 4-6 weeks once acute illness resolves   Discharge Condition: Stable, improved CODE STATUS: Full  Diet recommendation: Carb modified   Brief/Interim Summary: Kathleen [name] is a 33 year old female with past medical history significant for type 2 diabetes, hyperlipidemia, hypertension, asthma, morbid obesity who was recently discharged from the hospital after treatment of DKA who now presents with cough and fever.  During her previous admission, she was found at the time to have pneumomediastinum thought to be secondary to excessive vomiting and coughing.  She states that she is continued to cough since her discharge from the hospital.  She denies any IV illicit drug use, admits to marijuana only.  In the emergency department, she was found to have sepsis with fever, tachycardia and tachypnea.  Chest x-ray revealed cavitary lesion and CT chest confirmed large pulmonary abscess.  At time of admission, case was discussed with cardiothoracic surgery who felt that there was no indication for surgical intervention at this time.  Discharge Diagnoses:   Sepsis secondary to pulmonary abscess, sepsis present on admission -MRSA PCR negative  -Case discussed with cardiothoracic surgery at time of admission, who did not feel there was indication for surgical intervention at this time -Echocardiogram unremarkable  -SARS-CoV-2  negative -Blood cultures negative to date  -Respiratory culture pending -Leukocytosis improving -Discussed with infectious disease Dr. Prince Rome over the phone.  He recommends a TB work-up including QuantiFERON gold testing and bronchoscopy due to location of pulmonary cavitary lesion.  QuantiFERON gold testing was ordered.  I discussed case with PCCM on-call, as patient had already eaten food today, bronchoscopy will not be possible today.  They can arrange for outpatient bronchoscopy follow-up.  I discussed with patient regarding these recommendations; she is not interested in TB work-up in house and does not believe she has TB.  She does not have any known risk factors for TB.  She is requesting to sign out [place], she will follow-up with her primary care physician for TB work-up and follow-up with pulmonology.  Antibiotics prescribed   Type 2 diabetes -Hemoglobin A1c 15 previous admission -Lantus, SSI Novolog   Elevated TSH -Free T4 normal.  Repeat TSH as an outpatient in several weeks  GERD -Continue PPI   Discharge Instructions  Discharge Instructions    Call MD for:  difficulty breathing, headache or visual disturbances   Complete by:  As directed    Call MD for:  extreme fatigue   Complete by:  As directed    Call MD for:  hives   Complete by:  As directed    Call MD for:  persistant dizziness or light-headedness   Complete by:  As directed    Call MD for:  persistant nausea and vomiting   Complete by:  As directed    Call MD for:  severe uncontrolled pain   Complete by:  As directed    Call MD for:  temperature >100.4   Complete by:  As directed  Increase activity slowly   Complete by:  As directed      Allergies as of 09/08/2018      Reactions   Amoxicillin Other (See Comments)   Pain in back   Apple Anaphylaxis   Peaches, plums, pears, bananas, grapes, kiwi, all melons. CAN HAVE BLUEBERRIES AND STRAWBERRIES   Corn-containing Products Anaphylaxis,  Hives   Penicillins Other (See Comments)   Did it involve swelling of the face/tongue/throat, SOB, or low BP? Yes Did it involve sudden or severe rash/hives, skin peeling, or any reaction on the inside of your mouth or nose? Yes Did you need to seek medical attention at a hospital or doctor's office? Was already at hospital When did it last happen?2010 If all above answers are "NO", may proceed with cephalosporin use.   Soy Allergy Anaphylaxis   Fruit & Vegetable Daily [nutritional Supplements]    Allergy to fruits- Grapes, pears, peaches, apples, plums, bananas, kiwi, melons and mangoes.    Peanut-containing Drug Products Hives   ALL NUTS!   Shellfish Allergy Hives, Itching, Swelling   Wheat Hives, Itching      Medication List    STOP taking these medications   Insulin Aspart FlexPen 100 UNIT/ML Sopn   potassium chloride SA 20 MEQ tablet Commonly known as:  K-DUR     TAKE these medications   amLODipine 2.5 MG tablet Commonly known as:  NORVASC Take 1 tablet (2.5 mg total) by mouth daily.   Basaglar KwikPen 100 UNIT/ML Sopn Inject 0.1 mLs (10 Units total) into the skin 2 (two) times daily.   BEANO PO Take 1 tablet by mouth 2 (two) times daily as needed (gas).   benzonatate 100 MG capsule Commonly known as:  TESSALON Take 1 capsule (100 mg total) by mouth 2 (two) times daily.   cefdinir 300 MG capsule Commonly known as:  OMNICEF Take 1 capsule (300 mg total) by mouth 2 (two) times daily for 21 days.   dextromethorphan 30 MG/5ML liquid Commonly known as:  DELSYM Take 5 mLs (30 mg total) by mouth 2 (two) times daily.   EPINEPHrine 0.3 mg/0.3 mL Soaj injection Commonly known as:  EpiPen Inject 0.3 mLs (0.3 mg total) into the muscle once as needed (for severe allergic reaction).   FreeStyle Libre Reader Devi 1 Device by Does not apply route as directed.   FreeStyle [employer] System Misc 1 Device by Does not apply route 3 (three) times daily as needed.    glucose blood test strip Use as instructed   glucose monitoring kit monitoring kit 1 each by Does not apply route as needed for other.   HumaLOG KwikPen 100 UNIT/ML KwikPen Generic drug:  insulin lispro Inject 6 Units as directed 3 (three) times daily with meals.   levonorgestrel-ethinyl estradiol 0.15-30 MG-MCG tablet Commonly known as:  Levora 0.15/30 (28) Take 1 tablet by mouth daily.   metFORMIN 500 MG tablet Commonly known as:  GLUCOPHAGE Take 1 tablet (500 mg total) by mouth daily with breakfast.   metroNIDAZOLE 500 MG tablet Commonly known as:  Flagyl Take 1 tablet (500 mg total) by mouth 3 (three) times daily for 21 days.   onetouch ultrasoft lancets Use as instructed   pantoprazole 20 MG tablet Commonly known as:  Protonix Take 1 tablet (20 mg total) by mouth 2 (two) times daily before a meal.      Follow-up Information    Martinique, Betty G, MD. Schedule an appointment as soon as possible for a visit in  1 week(s).   Specialty:  Family Medicine Contact information: [place] [address] [phone]        [facility] Follow up.   Specialty:  Pulmonology Why:  They will call with follow up appointment. Please call them if you haven't heard back Contact information: [place] [phone]         Allergies  Allergen Reactions  . Amoxicillin Other (See Comments)    Pain in back  . Apple Anaphylaxis    Peaches, plums, pears, bananas, grapes, kiwi, all melons. CAN HAVE BLUEBERRIES AND STRAWBERRIES  . Corn-Containing Products Anaphylaxis and Hives  . Penicillins Other (See Comments)    Did it involve swelling of the face/tongue/throat, SOB, or low BP? Yes Did it involve sudden or severe rash/hives, skin peeling, or any reaction on the inside of your mouth or nose? Yes Did you need to seek medical attention at a hospital or doctor's office? Was already at  hospital When did it last happen?2010 If all above answers are "NO", may proceed with cephalosporin use.   . Soy Allergy Anaphylaxis  . Fruit & Vegetable Daily [Nutritional Supplements]     Allergy to fruits- Grapes, pears, peaches, apples, plums, bananas, kiwi, melons and mangoes.   . Peanut-Containing Drug Products Hives    ALL NUTS!  . Shellfish Allergy Hives, Itching and Swelling  . Wheat Hives and Itching    Consultations:  Cardiothoracic surgery over the phone  Infectious disease over the phone  PCCM over the phone   Procedures/Studies: Ct Chest Wo Contrast  Result Date: 08/12/2018 CLINICAL DATA:  Evaluate pneumomediastinum, vomiting EXAM: CT CHEST WITHOUT CONTRAST TECHNIQUE: Multidetector CT imaging of the chest was performed following the standard protocol without IV contrast. COMPARISON:  Chest radiographs, 08/11/2018 FINDINGS: Cardiovascular: No significant vascular findings. Normal heart size. No pericardial effusion. Mediastinum/Nodes: Pneumomediastinum. No enlarged mediastinal, hilar, or axillary lymph nodes. Thyroid gland, trachea, and esophagus demonstrate no significant findings. Lungs/Pleura: Mild bibasilar atelectasis. No pleural effusion or pneumothorax. Upper Abdomen: No acute abnormality. Musculoskeletal: No chest wall mass or suspicious bone lesions identified. IMPRESSION: Pneumomediastinum, in keeping with findings of prior radiographs, without obvious causative etiology. Electronically Signed   By: Eddie Candle M.D.   On: 08/12/2018 17:22   Ct Angio Chest Pe W And/or Wo Contrast  Result Date: 09/06/2018 CLINICAL DATA:  Cough, shortness of breath, chest pain, fever EXAM: CT ANGIOGRAPHY CHEST WITH CONTRAST TECHNIQUE: Multidetector CT imaging of the chest was performed using the standard protocol during bolus administration of intravenous contrast. Multiplanar CT image reconstructions and MIPs were obtained to evaluate the vascular anatomy. CONTRAST:  153m OMNIPAQUE  IOHEXOL 350 MG/ML SOLN COMPARISON:  08/12/2018, same-day chest radiographs FINDINGS: Cardiovascular: Satisfactory opacification of the pulmonary arteries to the segmental level. No evidence of pulmonary embolism. Normal heart size. No pericardial effusion. Mediastinum/Nodes: Enlarged AP window and left paratracheal lymph nodes, largest AP window node measuring 2.3 cm in short axis (series 5, image 71). Thyroid gland, trachea, and esophagus demonstrate no significant findings. Lungs/Pleura: There is a masslike opacity of the left upper lobe measuring approximately 6.6 x 5.2 cm with an internal cavitary component and air and fluid level, in keeping with findings of prior radiographs (series 5, image 93). No pleural effusion or pneumothorax. Upper Abdomen: No acute abnormality. Musculoskeletal: No chest wall abnormality. No acute or significant osseous findings. Review of the MIP images confirms the above findings. IMPRESSION: 1.  Negative examination  for pulmonary embolism. 2. There is a masslike opacity of the left upper lobe measuring approximately 6.6 x 5.2 cm with an internal cavitary component and air and fluid level, in keeping with findings of prior radiographs (series 5, image 93). Given short interval since recent prior examination, this is likely cavitary infection. 3. Enlarged AP window and left paratracheal lymph nodes, largest AP window node measuring 2.3 cm in short axis (series 5, image 71), likely reactive in the setting of presumed infection. Electronically Signed   By: Eddie Candle M.D.   On: 09/06/2018 17:35   Dg Chest Port 1 View  Result Date: 09/06/2018 CLINICAL DATA:  Cough and congestion for several days EXAM: PORTABLE CHEST 1 VIEW COMPARISON:  08/11/2018 FINDINGS: Cardiac shadow is within normal limits. The lungs are well aerated bilaterally. There is a cavitary lesion identified in the mid left lung. No bony abnormality is noted. IMPRESSION: Cavitary lesion in the left mid lung suggestive  of lung abscess. CT may be helpful for further evaluation Electronically Signed   By: Inez Catalina M.D.   On: 09/06/2018 12:11     Discharge Exam: Vitals:   09/07/18 2050 09/08/18 0555  BP: 122/67 135/86  Pulse: 99 (!) 101  Resp: 20 18  Temp: 98.1 F (36.7 C) 98 F (36.7 C)  SpO2: 100% 99%    General: Pt is alert, awake, not in acute distress Cardiovascular: RRR, S1/S2 +, no rubs, no gallops Respiratory: CTA bilaterally, no wheezing, no rhonchi, on room air with no respiratory distress Abdominal: Soft, NT, ND, bowel sounds + Extremities: no edema, no cyanosis    The results of significant diagnostics from this hospitalization (including imaging, microbiology, ancillary and laboratory) are listed below for reference.     Microbiology: Recent Results (from the past 240 hour(s))  Blood Culture (routine x 2)     Status: None (Preliminary result)   Collection Time: 09/06/18 12:40 PM  Result Value Ref Range Status   Specimen Description   Final    BLOOD LEFT FOREARM Performed at [facility] [address] [place], [place] [address]    Special Requests   Final    BOTTLES DRAWN AEROBIC AND ANAEROBIC Blood Culture results may not be optimal due to an excessive volume of blood received in culture bottles Performed at [facility] [address], [place], [place] [address]    Culture   Final    NO GROWTH 2 DAYS Performed at [facility] [address] [place], [place] [address]    Report Status PENDING  Incomplete  SARS Coronavirus 2 [facility] order, Performed in [facility] hospital lab)     Status: None   Collection Time: 09/06/18 12:45 PM  Result Value Ref Range Status   SARS Coronavirus 2 NEGATIVE NEGATIVE Final    Comment: (NOTE) If result is NEGATIVE SARS-CoV-2 target nucleic acids are NOT DETECTED. The SARS-CoV-2 RNA is generally detectable in upper and lower  respiratory specimens during the acute phase of infection.  The lowest  concentration of SARS-CoV-2 viral copies this assay can detect is 250  copies / mL. A negative result does not preclude SARS-CoV-2 infection  and should not be used as the sole basis for treatment or other  patient management decisions.  A negative result may occur with  improper specimen collection / handling, submission of specimen other  than nasopharyngeal swab, presence of viral mutation(s) within the  areas targeted by this assay, and inadequate number of  viral copies  (<250 copies / mL). A negative result must be combined with clinical  observations, patient history, and epidemiological information. If result is POSITIVE SARS-CoV-2 target nucleic acids are DETECTED. The SARS-CoV-2 RNA is generally detectable in upper and lower  respiratory specimens dur ing the acute phase of infection.  Positive  results are indicative of active infection with SARS-CoV-2.  Clinical  correlation with patient history and other diagnostic information is  necessary to determine patient infection status.  Positive results do  not rule out bacterial infection or co-infection with other viruses. If result is PRESUMPTIVE POSTIVE SARS-CoV-2 nucleic acids MAY BE PRESENT.   A presumptive positive result was obtained on the submitted specimen  and confirmed on repeat testing.  While 2019 novel coronavirus  (SARS-CoV-2) nucleic acids may be present in the submitted sample  additional confirmatory testing may be necessary for epidemiological  and / or clinical management purposes  to differentiate between  SARS-CoV-2 and other Sarbecovirus currently known to infect humans.  If clinically indicated additional testing with an alternate test  methodology [phone]) is advised. The SARS-CoV-2 RNA is generally  detectable in upper and lower respiratory sp ecimens during the acute  phase of infection. The expected result is Negative. Fact Sheet for Patients:   StrictlyIdeas.no Fact Sheet for Healthcare Providers: BankingDealers.co.za This test is not yet approved or cleared by the [place] FDA and has been authorized for detection and/or diagnosis of SARS-CoV-2 by FDA under an Emergency Use Authorization (EUA).  This EUA will remain in effect (meaning this test can be used) for the duration of the COVID-19 declaration under Section 564(b)(1) of the Act, 21 U.S.C. section 360bbb-3(b)(1), unless the authorization is terminated or revoked sooner. Performed at [facility], [place] [address], [place], [place] [address]   Blood Culture (routine x 2)     Status: None (Preliminary result)   Collection Time: 09/06/18  2:45 PM  Result Value Ref Range Status   Specimen Description   Final    BLOOD LEFT ARM Performed at [facility] [address] [place], [place] [address]    Special Requests   Final    BOTTLES DRAWN AEROBIC AND ANAEROBIC Blood Culture results may not be optimal due to an excessive volume of blood received in culture bottles Performed at [facility] [address] [place], [place] [address]    Culture   Final    NO GROWTH 2 DAYS Performed at [facility] [address], [place], [place] [address]    Report Status PENDING  Incomplete  Culture, sputum-assessment     Status: None   Collection Time: 09/06/18  9:45 PM  Result Value Ref Range Status   Specimen Description SPUTUM  Final   Special Requests Immunocompromised  Final   Sputum evaluation   Final    THIS SPECIMEN IS ACCEPTABLE FOR SPUTUM CULTURE Performed at [facility], [place] [address], [place], [place] [address]    Report Status 09/07/2018 FINAL  Final  Culture, respiratory     Status: None (Preliminary result)   Collection Time: 09/06/18  9:45 PM  Result Value Ref Range Status   Specimen Description   Final    SPUTUM Performed at  [facility] [address] [place], [place] [address]    Special Requests   Final    Immunocompromised Reflexed from [id number] Performed at [facility], [place] [address], [place], [place] [address]  Gram Stain   Final    ABUNDANT WBC PRESENT, PREDOMINANTLY PMN MODERATE GRAM NEGATIVE RODS MODERATE GRAM POSITIVE COCCI MODERATE GRAM POSITIVE RODS    Culture   Final    CULTURE REINCUBATED FOR BETTER GROWTH Performed at [facility], [place] [address] [place], [place] [address]    Report Status PENDING  Incomplete  MRSA PCR Screening     Status: None   Collection Time: 09/07/18  2:37 PM  Result Value Ref Range Status   MRSA by PCR NEGATIVE NEGATIVE Final    Comment:        The GeneXpert MRSA Assay (FDA approved for NASAL specimens only), is one component of a comprehensive MRSA colonization surveillance program. It is not intended to diagnose MRSA infection nor to guide or monitor treatment for MRSA infections. Performed at [facility], [place] [address] [place], [place] [address]      Labs: BNP (last 3 results) No results for input(s): BNP in the last 8760 hours. Basic Metabolic Panel: Recent Labs  Lab 09/06/18 1240 09/06/18 1245 09/07/18 0038 09/08/18 0545  NA 137 137 137 138  K 3.3* 3.3* 3.8 3.5  CL 107 107 110 111  CO2 20* 20* 21* 17*  GLUCOSE 138* 140* 118* 101*  BUN '7 7 6 6  '$ CREATININE 0.75 0.76 0.74 0.73  CALCIUM 8.4* 8.3* 8.2* 8.4*  MG 1.8  --  1.7  --   PHOS 3.4  --  2.2*  --    Liver Function Tests: Recent Labs  Lab 09/06/18 1240 09/07/18 0038  AST 24 21  ALT 33 30  ALKPHOS 74 64  BILITOT 0.3 0.5  PROT 7.1 6.5  ALBUMIN 2.7* 2.5*   No results for input(s): LIPASE, AMYLASE in the last 168 hours. No results for input(s): AMMONIA in the last 168 hours. CBC: Recent Labs  Lab 09/06/18 1240 09/07/18 0038 09/08/18 0545  WBC 20.0* 14.7* 11.0*  NEUTROABS 14.9*  --   --   HGB 10.1*  9.6* 10.2*  HCT 32.8* 32.8* 33.1*  MCV 88.2 91.6 89.9  PLT 589* 541* 580*   Cardiac Enzymes: No results for input(s): CKTOTAL, CKMB, CKMBINDEX, TROPONINI in the last 168 hours. BNP: Invalid input(s): POCBNP CBG: Recent Labs  Lab 09/07/18 0732 09/07/18 1155 09/07/18 1642 09/07/18 2047 09/08/18 0752  GLUCAP 86 131* 157* 98 86   D-Dimer No results for input(s): DDIMER in the last 72 hours. Hgb A1c Recent Labs    09/06/18 2202  HGBA1C 11.3*   Lipid Profile No results for input(s): CHOL, HDL, LDLCALC, TRIG, CHOLHDL, LDLDIRECT in the last 72 hours. Thyroid function studies Recent Labs    09/07/18 0038  TSH 7.090*   Anemia work up Recent Labs    09/06/18 1240  VITAMINB12 208  FOLATE 10.1  FERRITIN 331*  TIBC 302  IRON 9*  RETICCTPCT 2.1   Urinalysis    Component Value Date/Time   COLORURINE YELLOW 09/06/2018 1627   APPEARANCEUR CLEAR 09/06/2018 1627   LABSPEC 1.006 09/06/2018 1627   PHURINE 8.0 09/06/2018 1627   GLUCOSEU NEGATIVE 09/06/2018 1627   HGBUR NEGATIVE 09/06/2018 1627   BILIRUBINUR NEGATIVE 09/06/2018 1627   KETONESUR NEGATIVE 09/06/2018 1627   PROTEINUR NEGATIVE 09/06/2018 1627   NITRITE NEGATIVE 09/06/2018 1627   LEUKOCYTESUR SMALL (A) 09/06/2018 1627   Sepsis Labs Invalid input(s): PROCALCITONIN,  WBC,  LACTICIDVEN Microbiology Recent Results (from the past 240 hour(s))  Blood Culture (routine x 2)     Status: None (  Preliminary result)   Collection Time: 09/06/18 12:40 PM  Result Value Ref Range Status   Specimen Description   Final    BLOOD LEFT FOREARM Performed at [facility] [address], [place], [place] [address]    Special Requests   Final    BOTTLES DRAWN AEROBIC AND ANAEROBIC Blood Culture results may not be optimal due to an excessive volume of blood received in culture bottles Performed at [facility] [address], [place], [place] [address]    Culture   Final    NO GROWTH 2  DAYS Performed at [facility] [address], [place], [place] [address]    Report Status PENDING  Incomplete  SARS Coronavirus 2 [facility] order, Performed in [facility] hospital lab)     Status: None   Collection Time: 09/06/18 12:45 PM  Result Value Ref Range Status   SARS Coronavirus 2 NEGATIVE NEGATIVE Final    Comment: (NOTE) If result is NEGATIVE SARS-CoV-2 target nucleic acids are NOT DETECTED. The SARS-CoV-2 RNA is generally detectable in upper and lower  respiratory specimens during the acute phase of infection. The lowest  concentration of SARS-CoV-2 viral copies this assay can detect is 250  copies / mL. A negative result does not preclude SARS-CoV-2 infection  and should not be used as the sole basis for treatment or other  patient management decisions.  A negative result may occur with  improper specimen collection / handling, submission of specimen other  than nasopharyngeal swab, presence of viral mutation(s) within the  areas targeted by this assay, and inadequate number of viral copies  (<250 copies / mL). A negative result must be combined with clinical  observations, patient history, and epidemiological information. If result is POSITIVE SARS-CoV-2 target nucleic acids are DETECTED. The SARS-CoV-2 RNA is generally detectable in upper and lower  respiratory specimens dur ing the acute phase of infection.  Positive  results are indicative of active infection with SARS-CoV-2.  Clinical  correlation with patient history and other diagnostic information is  necessary to determine patient infection status.  Positive results do  not rule out bacterial infection or co-infection with other viruses. If result is PRESUMPTIVE POSTIVE SARS-CoV-2 nucleic acids MAY BE PRESENT.   A presumptive positive result was obtained on the submitted specimen  and confirmed on repeat testing.  While 2019 novel coronavirus  (SARS-CoV-2) nucleic acids may be present in the submitted  sample  additional confirmatory testing may be necessary for epidemiological  and / or clinical management purposes  to differentiate between  SARS-CoV-2 and other Sarbecovirus currently known to infect humans.  If clinically indicated additional testing with an alternate test  methodology [phone]) is advised. The SARS-CoV-2 RNA is generally  detectable in upper and lower respiratory sp ecimens during the acute  phase of infection. The expected result is Negative. Fact Sheet for Patients:  StrictlyIdeas.no Fact Sheet for Healthcare Providers: BankingDealers.co.za This test is not yet approved or cleared by the [place] FDA and has been authorized for detection and/or diagnosis of SARS-CoV-2 by FDA under an Emergency Use Authorization (EUA).  This EUA will remain in effect (meaning this test can be used) for the duration of the COVID-19 declaration under Section 564(b)(1) of the Act, 21 U.S.C. section 360bbb-3(b)(1), unless the authorization is terminated or revoked sooner. Performed at [facility], [place] [address], [place], [place] [address]   Blood Culture (routine x 2)     Status: None (  Preliminary result)   Collection Time: 09/06/18  2:45 PM  Result Value Ref Range Status   Specimen Description   Final    BLOOD LEFT ARM Performed at [facility] [address] [address], [place], [place] [address]    Special Requests   Final    BOTTLES DRAWN AEROBIC AND ANAEROBIC Blood Culture results may not be optimal due to an excessive volume of blood received in culture bottles Performed at [facility] [address], [place], [place] [address]    Culture   Final    NO GROWTH 2 DAYS Performed at [facility] [address], [place], [place] [address]    Report Status PENDING  Incomplete  Culture, sputum-assessment     Status: None   Collection Time: 09/06/18  9:45 PM  Result  Value Ref Range Status   Specimen Description SPUTUM  Final   Special Requests Immunocompromised  Final   Sputum evaluation   Final    THIS SPECIMEN IS ACCEPTABLE FOR SPUTUM CULTURE Performed at [facility], [place] [address] [place], [place] [address]    Report Status 09/07/2018 FINAL  Final  Culture, respiratory     Status: None (Preliminary result)   Collection Time: 09/06/18  9:45 PM  Result Value Ref Range Status   Specimen Description   Final    SPUTUM Performed at [facility] [address] [place], [place] [address]    Special Requests   Final    Immunocompromised Reflexed from [id number] Performed at [facility], [place] [address], [place], [place] [address]    Gram Stain   Final    ABUNDANT WBC PRESENT, PREDOMINANTLY PMN MODERATE GRAM NEGATIVE RODS MODERATE GRAM POSITIVE COCCI MODERATE GRAM POSITIVE RODS    Culture   Final    CULTURE REINCUBATED FOR BETTER GROWTH Performed at [facility], [place] [address], [place], [place] [address]    Report Status PENDING  Incomplete  MRSA PCR Screening     Status: None   Collection Time: 09/07/18  2:37 PM  Result Value Ref Range Status   MRSA by PCR NEGATIVE NEGATIVE Final    Comment:        The GeneXpert MRSA Assay (FDA approved for NASAL specimens only), is one component of a comprehensive MRSA colonization surveillance program. It is not intended to diagnose MRSA infection nor to guide or monitor treatment for MRSA infections. Performed at [facility], [place] [address], [place], [place] [address]      Patient was seen and examined on the day of discharge and was found to be in stable condition. Time coordinating discharge: 35 minutes including assessment and coordination of care, as well as examination of the patient.   SIGNED:  Dessa Phi, DO [facility] [email or web address] 09/08/2018, 1:00 PM

## 2018-09-08 NOTE — Progress Notes (Signed)
Pharmacy Antibiotic Note  Kathleen [name] is a 33 y.o. female admitted on 09/06/2018 with pneumonia.  Pharmacy has been consulted for cefepime dosing. Pt has PCN allergy but is tolerating cefepime per discussion with MD.  Pt presented with cough, SOB, fever. PHM significant for T2DM, HTN, HLD. Pt was started on broad spectrum antibiotics on admission - vancomycin, cefepime, and metronidazole. Vancomycin discontinued on 6/5 per discussion with MD for negative MRSA PCR.  Today, 09/08/18  WBC 11 - remains elevated but improving  SCr 0.73 - WNL, stable  Afebrile  Plan:  Continue cefepime 2 g IV q8h  Metronidazole 500 mg IV q8h  Do not anticipate any renal dose adjustment needed - pharmacy to sign off and follow peripherally.  Height: 5\' 4"  (162.6 cm) Weight: 218 lb (98.9 kg) IBW/kg (Calculated) : 54.7  Temp (24hrs), Avg:98.3 F (36.8 C), Min:98 F (36.7 C), Max:98.9 F (37.2 C)  Recent Labs  Lab 09/06/18 1240 09/06/18 1245 09/06/18 1450 09/06/18 2202 09/07/18 0038 09/08/18 0545  WBC 20.0*  --   --   --  14.7* 11.0*  CREATININE 0.75 0.76  --   --  0.74 0.73  LATICACIDVEN  --  1.0 1.1 1.0 1.0  --     Estimated Creatinine Clearance: 116.5 mL/min (by C-G formula based on SCr of 0.73 mg/dL).    Allergies  Allergen Reactions  . Amoxicillin Other (See Comments)    Pain in back  . Apple Anaphylaxis    Peaches, plums, pears, bananas, grapes, kiwi, all melons. CAN HAVE BLUEBERRIES AND STRAWBERRIES  . Corn-Containing Products Anaphylaxis and Hives  . Penicillins Other (See Comments)    Did it involve swelling of the face/tongue/throat, SOB, or low BP? Yes Did it involve sudden or severe rash/hives, skin peeling, or any reaction on the inside of your mouth or nose? Yes Did you need to seek medical attention at a hospital or doctor's office? Was already at hospital When did it last happen?2010 If all above answers are "NO", may proceed with cephalosporin use.   . Soy  Allergy Anaphylaxis  . Fruit & Vegetable Daily [Nutritional Supplements]     Allergy to fruits- Grapes, pears, peaches, apples, plums, bananas, kiwi, melons and mangoes.   . Peanut-Containing Drug Products Hives    ALL NUTS!  . Shellfish Allergy Hives, Itching and Swelling  . Wheat Hives and Itching    Antimicrobials this admission: cefepime 6/3 >>  vancomycin 6/3 >> 6/4 Metronidazole 6/3 >>  Dose adjustments this admission:  Microbiology results: 6/3 BCx: ngtd 6/3 Sputum: Pending - moderate GNR, GPC, GPR  6/4 MRSA PCR: Negative 6/3 COVID-19: Negative  Thank you for allowing pharmacy to be a part of this patient's care.  Cindi Carbon, PharmD 09/08/2018 8:34 AM

## 2018-09-09 LAB — CULTURE, RESPIRATORY W GRAM STAIN: Culture: NORMAL

## 2018-09-11 ENCOUNTER — Telehealth: Payer: Self-pay | Admitting: Family Medicine

## 2018-09-11 LAB — CULTURE, BLOOD (ROUTINE X 2)
Culture: NO GROWTH
Culture: NO GROWTH

## 2018-09-11 NOTE — Telephone Encounter (Signed)
She is not due for A1c at this time. I would prefer to wait until she is evaluated by pulmonologist. If needed, she can arrange a hospital follow-up appointment.  Thanks, BJ

## 2018-09-11 NOTE — Telephone Encounter (Signed)
Patient called stating she has a cough and is spitting up phlegm.  She has recently had pneumonia.  She states she is wanting to schedule labs.  Can she wait until after her cough is gone before getting these labs?

## 2018-09-11 NOTE — Telephone Encounter (Signed)
Delsym is a OTC medication, she does not need a prescription. In regard to TB test, this can be arranged here in the office or she can also requested when she sees pulmonologist. Thanks, BJ

## 2018-09-11 NOTE — Telephone Encounter (Signed)
Message sent to Dr. Jordan for review and approval. 

## 2018-09-11 NOTE — Telephone Encounter (Signed)
Patient schedule virtual visit for 09/12/2018.

## 2018-09-12 ENCOUNTER — Telehealth: Payer: Self-pay | Admitting: Family Medicine

## 2018-09-12 ENCOUNTER — Encounter: Payer: Self-pay | Admitting: Family Medicine

## 2018-09-12 ENCOUNTER — Ambulatory Visit (INDEPENDENT_AMBULATORY_CARE_PROVIDER_SITE_OTHER): Payer: No Typology Code available for payment source | Admitting: Family Medicine

## 2018-09-12 ENCOUNTER — Other Ambulatory Visit: Payer: Self-pay

## 2018-09-12 ENCOUNTER — Ambulatory Visit: Payer: Self-pay | Admitting: *Deleted

## 2018-09-12 DIAGNOSIS — R059 Cough, unspecified: Secondary | ICD-10-CM

## 2018-09-12 DIAGNOSIS — R05 Cough: Secondary | ICD-10-CM

## 2018-09-12 DIAGNOSIS — Z794 Long term (current) use of insulin: Secondary | ICD-10-CM

## 2018-09-12 DIAGNOSIS — R7989 Other specified abnormal findings of blood chemistry: Secondary | ICD-10-CM

## 2018-09-12 DIAGNOSIS — E119 Type 2 diabetes mellitus without complications: Secondary | ICD-10-CM

## 2018-09-12 DIAGNOSIS — J851 Abscess of lung with pneumonia: Secondary | ICD-10-CM | POA: Diagnosis not present

## 2018-09-12 DIAGNOSIS — K219 Gastro-esophageal reflux disease without esophagitis: Secondary | ICD-10-CM

## 2018-09-12 MED ORDER — DEXTROMETHORPHAN POLISTIREX ER 30 MG/5ML PO SUER: 30.0000 mg | Freq: Two times a day (BID) | ORAL | 0 refills | Status: DC

## 2018-09-12 NOTE — Telephone Encounter (Signed)
Patient had appointment at 11 am with pcp to discuss concerns.

## 2018-09-12 NOTE — Telephone Encounter (Signed)
Patient is calling with concerns about her morning /fasting glucose levels. She was recently in hospital and was not given morning insulin if she was low. Patient states her glucose level is 76 and she wants to know if she should have adjustments. Reason for Disposition . [1] Caller has NON-URGENT medication or insulin pump question AND [2] triager unable to answer question  Answer Assessment - Initial Assessment Questions 1. SYMPTOMS: "What symptoms are you concerned about?"     Low glucose levels 76 2. ONSET:  "When did the symptoms start?"     Patient is concerned about low morning levels 3. BLOOD GLUCOSE: "What is your blood glucose level?"      76 this morning 4. USUAL RANGE: "What is your blood glucose level usually?" (e.g., usual fasting morning value, usual evening value)     Patient has concerns about fluctuating levels in the morning 5. TYPE 1 or 2:  "Do you know what type of diabetes you have?"  (e.g., Type 1, Type 2, Gestational; doesn't know)      Type 2 6. INSULIN: "Do you take insulin?" "What type of insulin(s) do you use? What is the mode of delivery? (syringe, pen (e.g., injection or  pump)      Yes- she takes insulin in the mornings before breakfast 7. DIABETES PILLS: "Do you take any pills for your diabetes?"      8. OTHER SYMPTOMS: "Do you have any symptoms?" (e.g., fever, frequent urination, difficulty breathing, vomiting)     No other symptoms 9. LOW BLOOD GLUCOSE TREATMENT: "What have you done so far to treat the low blood glucose level?"     Patient has not taken her insulin today 10. FOOD: "When did you last eat or drink?"       Last night- patient has eaten her breakfast today 11. ALONE: "Are you alone right now or is someone with you?"        No-she is at home with mother 50. PREGNANCY: "Is there any chance you are pregnant?" "When was your last menstrual period?"       Not asked  Protocols used: [place]

## 2018-09-12 NOTE — Telephone Encounter (Signed)
Patient is scheduled for a hospital follow-up on 09/12/2018.

## 2018-09-12 NOTE — Telephone Encounter (Signed)
Patient has hospital follow up for 09/12/2018.

## 2018-09-12 NOTE — Progress Notes (Signed)
Virtual Visit via Video Note   I connected with Kathleen [name] on 09/12/18 at 11:00 AM EDT by a video enabled telemedicine application and verified that I am speaking with the correct person using two identifiers.  Location patient: home Location provider:home office Persons participating in the virtual visit: patient, provider  I discussed the limitations of evaluation and management by telemedicine and the availability of in person appointments. The patient expressed understanding and agreed to proceed.   HPI: Kathleen [name] is a 33 yo AA female with Hx of DM II,HTN,dyslipidemia,and GERD among some seen today to follow on recent hospitalization. She was discharged on 09/08/18,today is the 2nd business day after her hospital discharge.  She presented to the ER on 10/03/2018 because of persistent cough and fever. She was admitted because of sepsis with fever, tachycardia, and tachypnea. Initial CXR before admission revealed cavitary lesion.  Chest CT on 06/09/2018 left lung masslike opacity, cavitary lesion thought to be infectious.  Lab Results  Component Value Date   WBC 11.0 (H) 09/08/2018   HGB 10.2 (L) 09/08/2018   HCT 33.1 (L) 09/08/2018   MCV 89.9 09/08/2018   PLT 580 (H) 09/08/2018   BCx x 2 no growth in 5 days. Respiratory Cx bacteria consistent with normal respiratory flora.  MRSA PCR negative. Bronchoscopy was recommended as outpatient, pending appointment with pulmonologist.  TB screening was recommended ,she got upset,did not think she has TB and left hospital AMA. She was discharged on metronidazole 500 mg 3 times daily and Omnicef 300 mg daily to complete 21 days of treatment.  She is feeling much better. She denies fever, chills, body aches, night sweats, dyspnea, wheezing, or abdominal pain. She is reporting that cough has greatly improved after daily carvedilol discontinued.  She is requesting prescription for Delsym, which was recommended upon discharge to treat  cough.  HTN,she is on Amlodipine 2.5 mg daily. She is not checking BP at home. Tolerating well,no side effects reported.  Last visit she was complaining about bloating sensation and "gas", symptoms have resolved.  TSH mildly abnormal. Negative for changes in bowel habits, cold/heat intolerance. She has lost some wt,attributed to Metformin.  Lab Results  Component Value Date   TSH 7.090 (H) 09/07/2018   GERD,she is not sure if she should continue Protonix, which she is taking 20 mg bid. She has not noted heartburn. Negative for abdominal pain, nausea,vomiting, or melena.  DM II,she was discharged on Basaglar 10 U daily, Metformin 500 mg bid,and Humalog sliding scale. FG 70-low 100's. 2 hours post prandial 150-171. BS 61 x 1.  Lab Results  Component Value Date   HGBA1C 11.3 (H) 09/06/2018  Denies polydipsia,polyuria, or polyphagia.  She would like to try Dexcom for glucose monitoring. She is also requesting a prescription for Diflucan to prevent yeast infection. She denies vaginal discharge or pruritus.   ROS: See pertinent positives and negatives per HPI.  Past Medical History:  Diagnosis Date  . Allergy   . Bronchitis     History reviewed. No pertinent surgical history.  Family History  Problem Relation Age of Onset  . Diabetes Father   . Diabetes Maternal Grandmother   . Cancer Neg Hx   . Hypertension Neg Hx   . Stroke Neg Hx     Social History   Socioeconomic History  . Marital status: Married    Spouse name: Not on file  . Number of children: Not on file  . Years of education: Not on file  .  Highest education level: Not on file  Occupational History  . Not on file  Social Needs  . Financial resource strain: Not on file  . Food insecurity:    Worry: Not on file    Inability: Not on file  . Transportation needs:    Medical: Not on file    Non-medical: Not on file  Tobacco Use  . Smoking status: Never Smoker  . Smokeless tobacco: Never Used   Substance and Sexual Activity  . Alcohol use: Yes    Alcohol/week: 1.0 standard drinks    Types: 1 Standard drinks or equivalent per week  . Drug use: No  . Sexual activity: Yes    Partners: Male  Lifestyle  . Physical activity:    Days per week: Not on file    Minutes per session: Not on file  . Stress: Not on file  Relationships  . Social connections:    Talks on phone: Not on file    Gets together: Not on file    Attends religious service: Not on file    Active member of club or organization: Not on file    Attends meetings of clubs or organizations: Not on file    Relationship status: Not on file  . Intimate partner violence:    Fear of current or ex partner: Not on file    Emotionally abused: Not on file    Physically abused: Not on file    Forced sexual activity: Not on file  Other Topics Concern  . Not on file  Social History Narrative  . Not on file      Current Outpatient Medications:  .  Alpha-D-Galactosidase (BEANO PO), Take 1 tablet by mouth 2 (two) times daily as needed (gas)., Disp: , Rfl:  .  amLODipine (NORVASC) 2.5 MG tablet, Take 1 tablet (2.5 mg total) by mouth daily., Disp: 90 tablet, Rfl: 0 .  benzonatate (TESSALON) 100 MG capsule, Take 1 capsule (100 mg total) by mouth 2 (two) times daily., Disp: 45 capsule, Rfl: 0 .  cefdinir (OMNICEF) 300 MG capsule, Take 1 capsule (300 mg total) by mouth 2 (two) times daily for 21 days., Disp: 42 capsule, Rfl: 0 .  Continuous Blood Gluc Receiver (FREESTYLE LIBRE READER) DEVI, 1 Device by Does not apply route as directed., Disp: 6 Device, Rfl: 2 .  Continuous Blood Gluc Sensor (FREESTYLE LIBRE SENSOR SYSTEM) MISC, 1 Device by Does not apply route 3 (three) times daily as needed., Disp: 6 each, Rfl: 2 .  dextromethorphan (DELSYM) 30 MG/5ML liquid, Take 5 mLs (30 mg total) by mouth 2 (two) times daily., Disp: 89 mL, Rfl: 0 .  EPINEPHrine (EPIPEN) 0.3 mg/0.3 mL IJ SOAJ injection, Inject 0.3 mLs (0.3 mg total) into the  muscle once as needed (for severe allergic reaction)., Disp: 1 Device, Rfl: 1 .  glucose blood test strip, Use as instructed, Disp: 100 each, Rfl: 12 .  glucose monitoring kit (FREESTYLE) monitoring kit, 1 each by Does not apply route as needed for other., Disp: 1 each, Rfl: 0 .  Insulin Glargine (BASAGLAR KWIKPEN) 100 UNIT/ML SOPN, Inject 0.1 mLs (10 Units total) into the skin 2 (two) times daily., Disp: 9 pen, Rfl: 1 .  Lancets (ONETOUCH ULTRASOFT) lancets, Use as instructed, Disp: 100 each, Rfl: 6 .  levonorgestrel-ethinyl estradiol (LEVORA 0.15/30, 28,) 0.15-30 MG-MCG tablet, Take 1 tablet by mouth daily., Disp: 3 Package, Rfl: 3 .  metFORMIN (GLUCOPHAGE) 500 MG tablet, Take 1 tablet (500 mg total) by mouth  daily with breakfast., Disp: 90 tablet, Rfl: 0 .  metroNIDAZOLE (FLAGYL) 500 MG tablet, Take 1 tablet (500 mg total) by mouth 3 (three) times daily for 21 days., Disp: 63 tablet, Rfl: 0 .  pantoprazole (PROTONIX) 20 MG tablet, Take 1 tablet (20 mg total) by mouth 2 (two) times daily before a meal., Disp: 60 tablet, Rfl: 2  EXAM:  VITALS per patient if applicable:N/A  GENERAL: alert, oriented, appears well and in no acute distress  HEENT: atraumatic, conjunttiva clear, no obvious abnormalities on inspection of external nose/face.  NECK: normal movements of the head and neck  LUNGS: on inspection no signs of respiratory distress, breathing rate appears normal, no obvious gross SOB, gasping or wheezing  CV: no obvious cyanosis  Kathleen: moves all visible extremities without noticeable abnormality  PSYCH/NEURO: pleasant and cooperative, no obvious depression, + anxious.   ASSESSMENT AND PLAN:  Discussed the following assessment and plan: Orders Placed This Encounter  Procedures  . DG Chest 2 View  . CBC  . Fructosamine  . TSH  . QuantiFERON-TB Gold Plus    Abnormal TSH Mildly elevated TSH. Will plan on repeating TSH in 6 weeks.  Cough - Plan: dextromethorphan (DELSYM) 30  MG/5ML liquid Reporting great improvement but still would like to have Delsym to take as needed. Pending appt with pulmonologist.  GERD (gastroesophageal reflux disease) Recommend continuing Protonix 20 mg twice daily. GERD precautions discussed and also recommended. Follow-up in 12/2018, before if needed.  Type 2 diabetes mellitus without complications ([address]) Explained that I do not have tech supporting system needed for [facility] endocrinology referral, she prefers to hold on it. Continue Basaglar 10 units daily. Reported BS's do not correlate with HgA1C,which is not at goal. She would like to discontinue NovoLog.  Continue monitoring BS. Instructed about warning signs. Follow-up in 12/2018.  Pulmonary abscess ([place]) Complete antibiotic treatment. We discussed some side effects of antibiotics in general. Pulmonologist phone number given, so she can call and arrange appointment. Instructed about warning signs. We will plan on TB screening in a few weeks.   40 min face to face OV. > 50% was dedicated to discussion of Dx, prognosis, treatment options, and some side effects of medications.  I discussed the assessment and treatment plan with the patient. She  was provided an opportunity to ask questions and all were answered. The patient agreed with the plan and demonstrated an understanding of the instructions.   The patient was advised to call back or seek an in-person evaluation if the symptoms worsen or if the condition fails to improve as anticipated.  Return in about 3 months (around 12/13/2018) for DM II,HTN.    Kathleen Martinique, MD

## 2018-09-12 NOTE — Telephone Encounter (Signed)
Copied from [place] [id number]. Topic: General - Other >> Sep 12, 2018  3:18 PM Keene Breath wrote: Reason for CRM: Patient called to inform the doctor that she is having some acid reflux after drinking a smoothie.  She is not sure if it is her medication of if it is the drink.  Please advise and call patient to let her know what she needs to do.  CB# [phone]

## 2018-09-12 NOTE — Assessment & Plan Note (Addendum)
Explained that I do not have tech supporting system needed for [facility] endocrinology referral, she prefers to hold on it. Continue Basaglar 10 units daily. Reported BS's do not correlate with HgA1C,which is not at goal. She would like to discontinue NovoLog.  Continue monitoring BS. Instructed about warning signs. Follow-up in 12/2018.

## 2018-09-12 NOTE — Telephone Encounter (Signed)
Message sent to Dr. Jordan for review. 

## 2018-09-12 NOTE — Assessment & Plan Note (Signed)
Recommend continuing Protonix 20 mg twice daily. GERD precautions discussed and also recommended. Follow-up in 12/2018, before if needed.

## 2018-09-12 NOTE — Telephone Encounter (Signed)
Patient is calling with questions about how to manage low glucose numbers - she is concerned about her low glucose numbers in the morning. Discussion about keeping good records and discussing insulin adjustments.

## 2018-09-12 NOTE — Assessment & Plan Note (Signed)
Complete antibiotic treatment. We discussed some side effects of antibiotics in general. Pulmonologist phone number given, so she can call and arrange appointment. Instructed about warning signs. We will plan on TB screening in a few weeks.

## 2018-09-13 ENCOUNTER — Other Ambulatory Visit: Payer: No Typology Code available for payment source

## 2018-09-13 NOTE — Telephone Encounter (Signed)
Avoid foods and drinks that cause acid reflux and continue Protonix daily as recommended.  Thanks, BJ

## 2018-09-13 NOTE — Telephone Encounter (Signed)
Patient given recommendations and verbalized understanding.

## 2018-09-15 ENCOUNTER — Institutional Professional Consult (permissible substitution): Payer: No Typology Code available for payment source | Admitting: Emergency Medicine

## 2018-09-18 ENCOUNTER — Telehealth: Payer: Self-pay | Admitting: Family Medicine

## 2018-09-18 NOTE — Telephone Encounter (Signed)
Copied from [place] [id number]. Topic: Quick Communication - Rx Refill/Question >> Sep 18, 2018 10:51 AM Waylan Rocher, Lumin L wrote: Medication: metFORMIN (GLUCOPHAGE) 500 MG tablet (2 pills per day)  Has the patient contacted their pharmacy? yes (Agent: If no, request that the patient contact the pharmacy for the refill.) (Agent: If yes, when and what did the pharmacy advise?) send new script to reflect 2 pills per day  Preferred Pharmacy (with phone number or street name): [facility] [place], [place] [address] Phone: [phone] Fax: [phone]  Agent: Please be advised that RX refills may take up to 3 business days. We ask that you follow-up with your pharmacy.

## 2018-09-19 ENCOUNTER — Encounter: Payer: No Typology Code available for payment source | Attending: Family Medicine | Admitting: Registered"

## 2018-09-19 ENCOUNTER — Encounter: Payer: Self-pay | Admitting: Registered"

## 2018-09-19 ENCOUNTER — Other Ambulatory Visit: Payer: Self-pay

## 2018-09-19 ENCOUNTER — Encounter: Payer: Self-pay | Admitting: Family Medicine

## 2018-09-19 ENCOUNTER — Ambulatory Visit (INDEPENDENT_AMBULATORY_CARE_PROVIDER_SITE_OTHER): Payer: No Typology Code available for payment source | Admitting: Family Medicine

## 2018-09-19 DIAGNOSIS — Z6841 Body Mass Index (BMI) 40.0 and over, adult: Secondary | ICD-10-CM | POA: Diagnosis not present

## 2018-09-19 DIAGNOSIS — E119 Type 2 diabetes mellitus without complications: Secondary | ICD-10-CM

## 2018-09-19 DIAGNOSIS — I10 Essential (primary) hypertension: Secondary | ICD-10-CM | POA: Diagnosis not present

## 2018-09-19 DIAGNOSIS — Z794 Long term (current) use of insulin: Secondary | ICD-10-CM

## 2018-09-19 MED ORDER — METFORMIN HCL 500 MG PO TABS: 500.0000 mg | ORAL_TABLET | Freq: Two times a day (BID) | ORAL | 1 refills | Status: DC

## 2018-09-19 NOTE — Assessment & Plan Note (Signed)
Lantus decreased from 20 units daily to 10 units once daily. No changes in metformin 500 mg twice daily. Continue monitoring BS. Instructed about warning signs.

## 2018-09-19 NOTE — Assessment & Plan Note (Signed)
We discussed benefits of wt loss as well as adverse effects of obesity. Consistency with healthy diet and physical activity recommended.  
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
## 2018-09-19 NOTE — Progress Notes (Signed)
Virtual Visit via Video Note   I connected with Ms Amick on 09/21/18 at  9:30 AM EDT by a video enabled telemedicine application and verified that I am speaking with the correct person using two identifiers.  Location patient: home Location provider:home office Persons participating in the virtual visit: patient, provider  I discussed the limitations of evaluation and management by telemedicine and the availability of in person appointments. The patient expressed understanding and agreed to proceed.   HPI: Ms Ramer is a 33 yo female with Hx of DM II,HTN,and allergies among some who is concerned about BP's.She feels like she is "overmedicated." She is on Amlodipine 2.5 mg daily. She is having lightheadedness, she thinks her BP is low and wants to stop medication.  BP readings: most in the 130's/70's,some 140's/80's and 120's/60's.  She denies headache,visual changes,chest pain,dyspnea,palpitations.abdominal pain,or edema. Cough resolved after discontinuing Coreg.  Lab Results  Component Value Date   CREATININE 0.73 09/08/2018   BUN 6 09/08/2018   NA 138 09/08/2018   K 3.5 09/08/2018   CL 111 09/08/2018   CO2 17 (L) 09/08/2018   She has lost wt since she changed her diet,Metformin has also helped. Wt now 214 lb (221 lb). She has been consistent with following a healthful diet and hoping that she can stop Lantus now.  She is on Lantus 10 U bid and Metformin 500 mg bid.  Treatment recently adjusted. FG 80's-120, postprandial 130-160. Denies abdominal pain, nausea,vomiting, polydipsia,polyuria, or polyphagia. No urinary symptoms.  Lab Results  Component Value Date   HGBA1C 11.3 (H) 09/06/2018    ROS: See pertinent positives and negatives per HPI.  Past Medical History:  Diagnosis Date  . Allergy   . Bronchitis     No past surgical history on file.  Family History  Problem Relation Age of Onset  . Diabetes Father   . Diabetes Maternal Grandmother   . Cancer  Neg Hx   . Hypertension Neg Hx   . Stroke Neg Hx     Social History   Socioeconomic History  . Marital status: Married    Spouse name: Not on file  . Number of children: Not on file  . Years of education: Not on file  . Highest education level: Not on file  Occupational History  . Not on file  Social Needs  . Financial resource strain: Not on file  . Food insecurity    Worry: Not on file    Inability: Not on file  . Transportation needs    Medical: Not on file    Non-medical: Not on file  Tobacco Use  . Smoking status: Never Smoker  . Smokeless tobacco: Never Used  Substance and Sexual Activity  . Alcohol use: Yes    Alcohol/week: 1.0 standard drinks    Types: 1 Standard drinks or equivalent per week  . Drug use: No  . Sexual activity: Yes    Partners: Male  Lifestyle  . Physical activity    Days per week: Not on file    Minutes per session: Not on file  . Stress: Not on file  Relationships  . Social Herbalist on phone: Not on file    Gets together: Not on file    Attends religious service: Not on file    Active member of club or organization: Not on file    Attends meetings of clubs or organizations: Not on file    Relationship status: Not on file  .  Intimate partner violence    Fear of current or ex partner: Not on file    Emotionally abused: Not on file    Physically abused: Not on file    Forced sexual activity: Not on file  Other Topics Concern  . Not on file  Social History Narrative  . Not on file      Current Outpatient Medications:  .  Alpha-D-Galactosidase (BEANO PO), Take 1 tablet by mouth 2 (two) times daily as needed (gas)., Disp: , Rfl:  .  benzonatate (TESSALON) 100 MG capsule, Take 1 capsule (100 mg total) by mouth 2 (two) times daily. (Patient not taking: Reported on 09/19/2018), Disp: 45 capsule, Rfl: 0 .  cefdinir (OMNICEF) 300 MG capsule, Take 1 capsule (300 mg total) by mouth 2 (two) times daily for 21 days., Disp: 42  capsule, Rfl: 0 .  Continuous Blood Gluc Receiver (FREESTYLE LIBRE READER) DEVI, 1 Device by Does not apply route as directed., Disp: 6 Device, Rfl: 2 .  Continuous Blood Gluc Sensor (FREESTYLE LIBRE SENSOR SYSTEM) MISC, 1 Device by Does not apply route 3 (three) times daily as needed., Disp: 6 each, Rfl: 2 .  dextromethorphan (DELSYM) 30 MG/5ML liquid, Take 5 mLs (30 mg total) by mouth 2 (two) times daily. (Patient not taking: Reported on 09/19/2018), Disp: 89 mL, Rfl: 0 .  EPINEPHrine (EPIPEN) 0.3 mg/0.3 mL IJ SOAJ injection, Inject 0.3 mLs (0.3 mg total) into the muscle once as needed (for severe allergic reaction)., Disp: 1 Device, Rfl: 1 .  glucose blood test strip, Use as instructed, Disp: 100 each, Rfl: 12 .  glucose monitoring kit (FREESTYLE) monitoring kit, 1 each by Does not apply route as needed for other., Disp: 1 each, Rfl: 0 .  Insulin Glargine (BASAGLAR KWIKPEN) 100 UNIT/ML SOPN, Inject 0.1 mLs (10 Units total) into the skin 2 (two) times daily. (Patient taking differently: Inject 10 Units into the skin 1 day or 1 dose. ), Disp: 9 pen, Rfl: 1 .  Lancets (ONETOUCH ULTRASOFT) lancets, Use as instructed, Disp: 100 each, Rfl: 6 .  levonorgestrel-ethinyl estradiol (LEVORA 0.15/30, 28,) 0.15-30 MG-MCG tablet, Take 1 tablet by mouth daily., Disp: 3 Package, Rfl: 3 .  metFORMIN (GLUCOPHAGE) 500 MG tablet, Take 1 tablet (500 mg total) by mouth 2 (two) times daily with a meal., Disp: 180 tablet, Rfl: 1 .  metroNIDAZOLE (FLAGYL) 500 MG tablet, Take 1 tablet (500 mg total) by mouth 3 (three) times daily for 21 days. (Patient not taking: Reported on 09/21/2018), Disp: 63 tablet, Rfl: 0 .  metroNIDAZOLE (FLAGYL) 500 MG tablet, Take 1 tablet (500 mg total) by mouth 3 (three) times daily for 6 days., Disp: 18 tablet, Rfl: 0 .  pantoprazole (PROTONIX) 20 MG tablet, Take 1 tablet (20 mg total) by mouth 2 (two) times daily before a meal., Disp: 60 tablet, Rfl: 2  EXAM:  VITALS per patient if applicable:BP  030/09   Pulse 84   Resp 16   Wt 214 lb (97.1 kg)   BMI 36.73 kg/m   GENERAL: alert, oriented, appears well and in no acute distress  HEENT: atraumatic,normocepjalic, conjunctiva clear, no obvious facial abnormalities on inspection.  NECK: normal movements of the head and neck  LUNGS: on inspection no signs of respiratory distress, breathing rate appears normal, no obvious gross SOB, gasping or wheezing  CV: no obvious cyanosis  MS: moves all visible extremities without noticeable abnormality  PSYCH/NEURO: pleasant and cooperative, no obvious depression,she is anxious.Speech and thought processing grossly intact  ASSESSMENT AND PLAN:  Discussed the following assessment and plan:  Benign essential hypertension So she will try nonpharmacologic treatment, amlodipine 2.5 mg discontinued. We discussed possible complications of elevated BP. Monitor BP daily. Continue low-salt diet. Goal BP 130/80 or less.  Morbid obesity with BMI of 40.0-44.9, adult ([place]) We discussed benefits of wt loss as well as adverse effects of obesity. Consistency with healthy diet and physical activity recommended.   Type 2 diabetes mellitus without complications (HCC) Lantus decreased from 20 units daily to 10 units once daily. No changes in metformin 500 mg twice daily. Continue monitoring BS. Instructed about warning signs.     I discussed the assessment and treatment plan with the patient. She was provided an opportunity to ask questions and all were answered. The patient agreed with the plan and demonstrated an understanding of the instructions.    No follow-ups on file.    Mikeyla Music [place], MD

## 2018-09-19 NOTE — Patient Instructions (Addendum)
Great work on making some health promoting changes!! Getting into a routine of regular exercise, cutting out juice, and eating a fairly balanced diet are all great changes. Consider adding back in a few carbs with lunch and dinner to help make your changes more satisfying and sustainable. 45 grams of carbohydrate added to a balanced meal is a general guideline to start. Check your blood sugar 2 hrs after a meal to see how your body handled the carbohydrates.  Continue to include lots of vegetables in your diet!  Because your hypoglycemic symptoms may be subtle, when you don't "feel like yourself" check your blood sugar and if it is 70 or below treat with 15 grams of fast acting carbohydrates. See handout for ideas  Consider using a protein powder or plain greek yogurt in your smoothies to balance out the carbs.   Before we talk specifically about bread, consider getting clarification from an allergist on the wheat allergy in your chart. Please review this article on food allergy tests: [employer]  Monk fruit is usually not found as a single ingredient "because this extract may be 100-250 times sweeter than table sugar, many manufacturers mix monk fruit sweetener with other natural products, such as inulin or erythritol, to reduce the intensity of the sweetness." (from healthline.com) Since different brands may use different processes to blend it with other ingredients, you may find a different brand may work better for you. Using a smaller amount also work.

## 2018-09-19 NOTE — Assessment & Plan Note (Signed)
So she will try nonpharmacologic treatment, amlodipine 2.5 mg discontinued. We discussed possible complications of elevated BP. Monitor BP daily. Continue low-salt diet. Goal BP 130/80 or less.

## 2018-09-21 ENCOUNTER — Other Ambulatory Visit: Payer: Self-pay

## 2018-09-21 ENCOUNTER — Encounter: Payer: Self-pay | Admitting: Pulmonary Disease

## 2018-09-21 ENCOUNTER — Ambulatory Visit (INDEPENDENT_AMBULATORY_CARE_PROVIDER_SITE_OTHER): Payer: No Typology Code available for payment source | Admitting: Pulmonary Disease

## 2018-09-21 VITALS — BP 118/78 | HR 91 | Temp 98.1°F | Ht 65.0 in | Wt 216.6 lb

## 2018-09-21 DIAGNOSIS — E119 Type 2 diabetes mellitus without complications: Secondary | ICD-10-CM

## 2018-09-21 DIAGNOSIS — R911 Solitary pulmonary nodule: Secondary | ICD-10-CM

## 2018-09-21 DIAGNOSIS — J851 Abscess of lung with pneumonia: Secondary | ICD-10-CM | POA: Diagnosis not present

## 2018-09-21 LAB — CBC
HCT: 34.7 % — ABNORMAL LOW (ref 36.0–46.0)
Hemoglobin: 11.1 g/dL — ABNORMAL LOW (ref 12.0–15.0)
MCHC: 31.9 g/dL (ref 30.0–36.0)
MCV: 86.9 fl (ref 78.0–100.0)
Platelets: 468 10*3/uL — ABNORMAL HIGH (ref 150.0–400.0)
RBC: 3.99 Mil/uL (ref 3.87–5.11)
RDW: 16.3 % — ABNORMAL HIGH (ref 11.5–15.5)
WBC: 6.3 10*3/uL (ref 4.0–10.5)

## 2018-09-21 LAB — TSH: TSH: 3.43 u[IU]/mL (ref 0.35–4.50)

## 2018-09-21 MED ORDER — METRONIDAZOLE 500 MG PO TABS: 500.0000 mg | ORAL_TABLET | Freq: Three times a day (TID) | ORAL | 0 refills | Status: AC

## 2018-09-21 NOTE — Progress Notes (Signed)
Synopsis: Cavitary lung lesion  Subjective:   PATIENT ID: Kathleen [name] GENDER: female DOB: 10-20-1985, MRN: [id number]   HPI  Chief Complaint  Patient presents with  . Consult    Consult re: Cough. She reports she was taking carvedilol that caused her cough. She reports having the cough for a month which has resolved. She later developed bacterial PNE. She recently finished flagyl.    Kathleen [name] is a 33 year old female with type 2 diabetes and hypertension who presents to clinic as a pulmonary consult for left upper lobe abscess.  Hospital notes and discharge reviewed from 6/3 to 6/5.  Summarized as follows: Patient initially presented with 1 week history of shortness of breath, productive cough and fever.  CTA was negative for PE but did demonstrate a left upper lobe cavitary lesion.  She was started on antibiotics.  Pulmonary was consulted however patient left AMA before evaluation.  She was discharged with p.o. cefdinir and Flagyl for 21-day course.  Today, she reports that her fevers have resolved and her coughing has significantly improved since starting antibiotics.  No longer has pleuritic chest pain.  She has been compliant with her antibiotics however noticed that her Flagyl has run out before the 21 day course.  Denies risk factors for TB-no recent travel to endemic countries, no recent contact with any individuals high risk for TB including homelessness or jail.  She has been compliant with stay home orders for the last several weeks.  I have personally reviewed patient's past medical/family/social history, allergies, current medications.  Past Medical History:  Diagnosis Date  . Allergy   . Bronchitis      Family History  Problem Relation Age of Onset  . Diabetes Father   . Diabetes Maternal Grandmother   . Cancer Neg Hx   . Hypertension Neg Hx   . Stroke Neg Hx      Social History   Socioeconomic History  . Marital status: Married    Spouse  name: Not on file  . Number of children: Not on file  . Years of education: Not on file  . Highest education level: Not on file  Occupational History  . Not on file  Social Needs  . Financial resource strain: Not on file  . Food insecurity    Worry: Not on file    Inability: Not on file  . Transportation needs    Medical: Not on file    Non-medical: Not on file  Tobacco Use  . Smoking status: Never Smoker  . Smokeless tobacco: Never Used  Substance and Sexual Activity  . Alcohol use: Yes    Alcohol/week: 1.0 standard drinks    Types: 1 Standard drinks or equivalent per week  . Drug use: No  . Sexual activity: Yes    Partners: Male  Lifestyle  . Physical activity    Days per week: Not on file    Minutes per session: Not on file  . Stress: Not on file  Relationships  . Social Herbalist on phone: Not on file    Gets together: Not on file    Attends religious service: Not on file    Active member of club or organization: Not on file    Attends meetings of clubs or organizations: Not on file    Relationship status: Not on file  . Intimate partner violence    Fear of current or ex partner: Not on file    Emotionally abused:  Not on file    Physically abused: Not on file    Forced sexual activity: Not on file  Other Topics Concern  . Not on file  Social History Narrative  . Not on file     Allergies  Allergen Reactions  . Amoxicillin Other (See Comments)    Pain in back  . Apple Anaphylaxis    Peaches, plums, pears, bananas, grapes, kiwi, all melons. CAN HAVE BLUEBERRIES AND STRAWBERRIES  . Corn-Containing Products Anaphylaxis and Hives  . Penicillins Other (See Comments)    Did it involve swelling of the face/tongue/throat, SOB, or low BP? Yes Did it involve sudden or severe rash/hives, skin peeling, or any reaction on the inside of your mouth or nose? Yes Did you need to seek medical attention at a hospital or doctor's office? Was already at hospital  When did it last happen?2010 If all above answers are "NO", may proceed with cephalosporin use.   . Soy Allergy Anaphylaxis  . Fish Allergy   . Fruit & Vegetable Daily [Nutritional Supplements]     Allergy to fruits- Grapes, pears, peaches, apples, plums, bananas, kiwi, melons and mangoes.   . Peanut-Containing Drug Products Hives    ALL NUTS!  . Shellfish Allergy Hives, Itching and Swelling  . Wheat Hives and Itching     Outpatient Medications Prior to Visit  Medication Sig Dispense Refill  . cefdinir (OMNICEF) 300 MG capsule Take 1 capsule (300 mg total) by mouth 2 (two) times daily for 21 days. 42 capsule 0  . Continuous Blood Gluc Receiver (FREESTYLE LIBRE READER) DEVI 1 Device by Does not apply route as directed. 6 Device 2  . Continuous Blood Gluc Sensor (FREESTYLE LIBRE SENSOR SYSTEM) MISC 1 Device by Does not apply route 3 (three) times daily as needed. 6 each 2  . EPINEPHrine (EPIPEN) 0.3 mg/0.3 mL IJ SOAJ injection Inject 0.3 mLs (0.3 mg total) into the muscle once as needed (for severe allergic reaction). 1 Device 1  . glucose blood test strip Use as instructed 100 each 12  . glucose monitoring kit (FREESTYLE) monitoring kit 1 each by Does not apply route as needed for other. 1 each 0  . Insulin Glargine (BASAGLAR KWIKPEN) 100 UNIT/ML SOPN Inject 0.1 mLs (10 Units total) into the skin 2 (two) times daily. (Patient taking differently: Inject 10 Units into the skin 1 day or 1 dose. ) 9 pen 1  . Lancets (ONETOUCH ULTRASOFT) lancets Use as instructed 100 each 6  . levonorgestrel-ethinyl estradiol (LEVORA 0.15/30, 28,) 0.15-30 MG-MCG tablet Take 1 tablet by mouth daily. 3 Package 3  . metFORMIN (GLUCOPHAGE) 500 MG tablet Take 1 tablet (500 mg total) by mouth 2 (two) times daily with a meal. 180 tablet 1  . pantoprazole (PROTONIX) 20 MG tablet Take 1 tablet (20 mg total) by mouth 2 (two) times daily before a meal. 60 tablet 2  . Alpha-D-Galactosidase (BEANO PO) Take 1 tablet by  mouth 2 (two) times daily as needed (gas).    . benzonatate (TESSALON) 100 MG capsule Take 1 capsule (100 mg total) by mouth 2 (two) times daily. (Patient not taking: Reported on 09/19/2018) 45 capsule 0  . dextromethorphan (DELSYM) 30 MG/5ML liquid Take 5 mLs (30 mg total) by mouth 2 (two) times daily. (Patient not taking: Reported on 09/19/2018) 89 mL 0  . metroNIDAZOLE (FLAGYL) 500 MG tablet Take 1 tablet (500 mg total) by mouth 3 (three) times daily for 21 days. (Patient not taking: Reported on 09/21/2018) 63  tablet 0   No facility-administered medications prior to visit.     Review of Systems  Constitutional: Negative for chills, diaphoresis, fever, malaise/fatigue and weight loss.  HENT: Negative for congestion, ear pain and sore throat.   Respiratory: Positive for cough, sputum production and shortness of breath. Negative for hemoptysis and wheezing.   Cardiovascular: Positive for chest pain. Negative for palpitations and leg swelling.  Gastrointestinal: Negative for abdominal pain, heartburn and nausea.  Genitourinary: Negative for frequency.  Musculoskeletal: Negative for joint pain and myalgias.  Skin: Negative for itching and rash.  Neurological: Negative for dizziness, weakness and headaches.  Endo/Heme/Allergies: Does not bruise/bleed easily.  Psychiatric/Behavioral: Negative for depression. The patient is not nervous/anxious.     Objective:    Vitals:   09/21/18 0936  BP: 118/78  Pulse: 91  Temp: 98.1 F (36.7 C)  TempSrc: Oral  SpO2: 97%  Weight: 216 lb 9.6 oz (98.2 kg)  Height: '5\' 5"'$  (1.651 m)   Physical Exam: General: Obese, well-appearing, no acute distress HENT: [place], AT, OP clear, MMM, normal dentition Eyes: EOMI, no scleral icterus Respiratory: Clear to auscultation bilaterally.  No crackles, wheezing or rales Cardiovascular: RRR, -M/R/G, no JVD GI: BS+, soft, nontender Extremities:-Edema,-tenderness Neuro: AAO x4, CNII-XII grossly intact Skin: Intact, no  rashes or bruising Psych: Normal mood, normal affect  Chest imaging:  CT chest 08/12/2018-pneumomediastinum  CTA 10/03/2018-interval development of left upper lobe cavitary lesion measured 6.6 x 5.2 cm.  No PE  PFT: None  I have personally reviewed the above labs, images and tests noted above.    Assessment & Plan:   Left upper lobe abscess  Discussion: 33 year old female with interval development of left upper lobe cavitary lesion likely bacterial.  Improved symptoms on antibiotics.  Low suspicion for TB however will obtain QuantiFERON gold.  For management of your cavitary lung lesion: --Complete antibiotics for 21 days total --We will refill Flagyl for appropriate number of treatment days --We will order repeat CT chest without contrast on 7/15 --Please obtain labs including QuantiFERON ASAP  We will schedule virtual telemetry visit after CT scan has been completed  Deshea Pooley Rodman Pickle, MD [facility] 09/21/2018 10:12 AM  Personal pager: [phone] If unanswered, please page CCM On-call: [phone]

## 2018-09-21 NOTE — Patient Instructions (Signed)
For management of your cavitary lung lesion: --Complete antibiotics for 21 days total --We will refill Flagyl for appropriate number of treatment days --We will order repeat CT chest without contrast on 7/15 --Please obtain labs including QuantiFERON ASAP  We will schedule virtual telemetry visit after CT scan has been completed

## 2018-09-25 ENCOUNTER — Other Ambulatory Visit: Payer: Self-pay | Admitting: Family Medicine

## 2018-09-25 LAB — QUANTIFERON-TB GOLD PLUS
Mitogen-NIL: 8.49 IU/mL
NIL: 0.03 IU/mL
QuantiFERON-TB Gold Plus: NEGATIVE
TB1-NIL: 0 IU/mL
TB2-NIL: 0 IU/mL

## 2018-09-25 LAB — FRUCTOSAMINE: Fructosamine: 259 umol/L (ref 205–285)

## 2018-09-25 MED ORDER — ONETOUCH ULTRASOFT LANCETS MISC: 6 refills | Status: DC

## 2018-09-26 ENCOUNTER — Other Ambulatory Visit: Payer: Self-pay | Admitting: *Deleted

## 2018-09-26 MED ORDER — METFORMIN HCL 500 MG PO TABS: 500.0000 mg | ORAL_TABLET | Freq: Two times a day (BID) | ORAL | 1 refills | Status: DC

## 2018-09-26 NOTE — Telephone Encounter (Signed)
Rx sent to the pharmacy as requested. ?

## 2018-09-29 ENCOUNTER — Other Ambulatory Visit: Payer: Self-pay | Admitting: *Deleted

## 2018-09-29 MED ORDER — ONETOUCH DELICA PLUS LANCET33G MISC: 1.0000 | Freq: Every day | 3 refills | Status: DC

## 2018-10-02 ENCOUNTER — Telehealth: Payer: Self-pay | Admitting: Family Medicine

## 2018-10-02 ENCOUNTER — Telehealth: Payer: Self-pay

## 2018-10-02 ENCOUNTER — Other Ambulatory Visit: Payer: Self-pay | Admitting: *Deleted

## 2018-10-02 NOTE — Telephone Encounter (Signed)
Patient says her job faxed FMLA today and needs Dr. Martinique to complete to so it can be extended. Last completion was in May 2020.  Also needs diabetic foot exam.  Patient says her lancets were not the right gauge. The ones she got were 33G but that's incorrect and she can't remember what the gauge was.   Please contact patient to advise regarding lancets because she only has 2 left and she checks 4x per day.  Blood sugar was 59 this morning 10:37am and did what the nutritonist told her to do which is to drink half cup of juice and eat protein. Went up too 99 after that. Has been experiencing low blood sugars on and off for the past few days. Wondering if her insulin needs to be adjusted.  06/17    65 10:05am 06/24 100 before breakfast             180 after breakfast               64  02:22pm before lunch  88 after lunch

## 2018-10-02 NOTE — Telephone Encounter (Signed)
Copied from [place] [phone] [id number]. Topic: Referral - Request for Referral >> Oct 02, 2018 11:19 AM Marin Olp L wrote: Has patient seen PCP for this complaint? yes *If NO, is insurance requiring patient see PCP for this issue before PCP can refer them? no Referral for which specialty: allergist and diabetic eye exam Preferred provider/office: Dr. Martinique recommendations Reason for referral: allergic to something, has diabetes

## 2018-10-02 NOTE — Telephone Encounter (Signed)
Is it okay to place referral as requested. 

## 2018-10-02 NOTE — Telephone Encounter (Signed)
The patient had disability forms faxed  Fax forms to: [phone]  Disposition: Dr's Folder

## 2018-10-02 NOTE — Telephone Encounter (Signed)
Spoke with patient and informed her that one touch delica 84Y was sent to the pharmacy at 2:15 pm. Patient stated she would pick those up and let me know if those were the right ones because the ultrasoft that was sent in were the wrong ones.

## 2018-10-02 NOTE — Telephone Encounter (Signed)
Returned call to patient concerning Lancets. Patient informed that they were sent to pharmacy on 09/29/2018.

## 2018-10-02 NOTE — Telephone Encounter (Signed)
FYI sent to Dr. Jordan for review. 

## 2018-10-03 NOTE — Telephone Encounter (Signed)
Form given to provider on 10/02/2018 for completion.

## 2018-10-03 NOTE — Telephone Encounter (Signed)
She has Hx of allergies but I do not have an specific Dx for immunology referral.She is already following with pulmonologist. Why she thinks she needs to see an allergist?  Thanks, BJ

## 2018-10-03 NOTE — Telephone Encounter (Signed)
Left message for patient to return call to office concerning forms. Provider stated that she needed dates.

## 2018-10-03 NOTE — Telephone Encounter (Signed)
Pt returning your call.  Pt aware you will call back

## 2018-10-04 NOTE — Telephone Encounter (Signed)
Patient stated that the nutrtionist that she was referred to suggested that she have allergy testing done and have diabetic foot and eye exam.

## 2018-10-04 NOTE — Telephone Encounter (Signed)
Spoke with patient and she gave dates that she was out. Information passed on to the provider for completion of forms.

## 2018-10-04 NOTE — Telephone Encounter (Signed)
She certainly needs eye exam,which can be arranged with optometrist and referral may not be necessary. Foot exam we have also discussed but because we have done virtual visits I have not done monofilament test.  I do not think immunologist evaluation is needed at this time, I am not sure why nutritionist recommended it.  We can discuss this in more detail during next OV.  Thanks, BJ

## 2018-10-09 NOTE — Telephone Encounter (Signed)
Patient notified

## 2018-10-10 ENCOUNTER — Ambulatory Visit: Payer: No Typology Code available for payment source | Admitting: Registered"

## 2018-10-10 NOTE — Telephone Encounter (Signed)
Forms faxed to [employer].

## 2018-10-18 ENCOUNTER — Ambulatory Visit (INDEPENDENT_AMBULATORY_CARE_PROVIDER_SITE_OTHER): Payer: No Typology Code available for payment source | Admitting: Family Medicine

## 2018-10-18 ENCOUNTER — Ambulatory Visit
Admission: RE | Admit: 2018-10-18 | Discharge: 2018-10-18 | Disposition: A | Discharge status: Home / Self Care | Payer: No Typology Code available for payment source | Source: Ambulatory Visit | Attending: Pulmonary Disease | Admitting: Pulmonary Disease

## 2018-10-18 ENCOUNTER — Telehealth: Payer: Self-pay | Admitting: Pulmonary Disease

## 2018-10-18 ENCOUNTER — Other Ambulatory Visit: Payer: Self-pay

## 2018-10-18 ENCOUNTER — Encounter: Payer: Self-pay | Admitting: Family Medicine

## 2018-10-18 VITALS — Ht 65.0 in | Wt 204.0 lb

## 2018-10-18 DIAGNOSIS — J851 Abscess of lung with pneumonia: Secondary | ICD-10-CM

## 2018-10-18 DIAGNOSIS — Z794 Long term (current) use of insulin: Secondary | ICD-10-CM | POA: Diagnosis not present

## 2018-10-18 DIAGNOSIS — Z6841 Body Mass Index (BMI) 40.0 and over, adult: Secondary | ICD-10-CM

## 2018-10-18 DIAGNOSIS — E119 Type 2 diabetes mellitus without complications: Secondary | ICD-10-CM

## 2018-10-18 DIAGNOSIS — R911 Solitary pulmonary nodule: Secondary | ICD-10-CM

## 2018-10-18 NOTE — Telephone Encounter (Signed)
PCCM Telephone Encounter  Spoke with patient regarding her CT Chest results. Lung abscess resolved with residual scarring on CT. No further pulmonary follow-up needed.

## 2018-10-18 NOTE — Progress Notes (Signed)
Virtual Visit via Video Note   I connected with Kathleen [name] n 10/23/18 at  3:00 PM EDT by a video enabled telemedicine application and verified that I am speaking with the correct person using two identifiers.  Location patient: home Location provider:work office Persons participating in the virtual visit: patient, provider  I discussed the limitations of evaluation and management by telemedicine and the availability of in person appointments. The patient expressed understanding and agreed to proceed.   HPI:  Kathleen [name] is a 33 yo female who would like to discuss some of her recent BS's readings. DM II, Dx in 08/2018. She is on Lantus 10 U daily and Metformin 500 mg bid. She has tolerated medication well. Denies abdominal pain, nausea,vomiting, polydipsia,polyuria, or polyphagia.  She is exercising a few minutes at the time and 3-4 times epr week. Elliptica for 10 min at the time.  She is checking BS's a few times per day. Fasting BS's 70's-109. Post prandial glucose most of the time from 80's to 130's,once it was 170. She has had a couple of BS's < 70 (68 and 64),asymptomatic but still drank orange juice for treatment.  Lab Results  Component Value Date   HGBA1C 11.3 (H) 09/06/2018    She has 2-3 bowel movements per day and has not changed with Metformin. She is also reporting wt loss. She is now 204 Lbs. Eating healthier.  States that in the future she would like to change Metformin for a different medication.  Following with pulmonologist , Dr Lynnda Shields to left upper lobe abscess. She completed abx treatment. Feeling much better. Denies fever,chills,sore throat,cough,dyspnea,or wheezing. Still having some "discomfort" left upper chest with deep breathing.  She is not back to work, states that she does not feel quite ready to do so.   ROS: See pertinent positives and negatives per HPI.  Past Medical History:  Diagnosis Date  . Allergy   . Bronchitis   .  Diabetes mellitus without complication ([place])   . Hypertension     No past surgical history on file.  Family History  Problem Relation Age of Onset  . Diabetes Father   . Diabetes Maternal Grandmother   . Cancer Neg Hx   . Hypertension Neg Hx   . Stroke Neg Hx     Social History   Socioeconomic History  . Marital status: Married    Spouse name: Not on file  . Number of children: Not on file  . Years of education: Not on file  . Highest education level: Not on file  Occupational History  . Not on file  Social Needs  . Financial resource strain: Not on file  . Food insecurity    Worry: Not on file    Inability: Not on file  . Transportation needs    Medical: Not on file    Non-medical: Not on file  Tobacco Use  . Smoking status: Never Smoker  . Smokeless tobacco: Never Used  Substance and Sexual Activity  . Alcohol use: Yes    Alcohol/week: 1.0 standard drinks    Types: 1 Standard drinks or equivalent per week  . Drug use: No  . Sexual activity: Yes    Partners: Male  Lifestyle  . Physical activity    Days per week: Not on file    Minutes per session: Not on file  . Stress: Not on file  Relationships  . Social Herbalist on phone: Not on file    Gets  together: Not on file    Attends religious service: Not on file    Active member of club or organization: Not on file    Attends meetings of clubs or organizations: Not on file    Relationship status: Not on file  . Intimate partner violence    Fear of current or ex partner: Not on file    Emotionally abused: Not on file    Physically abused: Not on file    Forced sexual activity: Not on file  Other Topics Concern  . Not on file  Social History Narrative  . Not on file      Current Outpatient Medications:  .  Alpha-D-Galactosidase (BEANO PO), Take 1 tablet by mouth 2 (two) times daily as needed (gas)., Disp: , Rfl:  .  benzonatate (TESSALON) 100 MG capsule, Take 1 capsule (100 mg total) by  mouth 2 (two) times daily. (Patient not taking: Reported on 09/19/2018), Disp: 45 capsule, Rfl: 0 .  Continuous Blood Gluc Receiver (FREESTYLE LIBRE READER) DEVI, 1 Device by Does not apply route as directed., Disp: 6 Device, Rfl: 2 .  Continuous Blood Gluc Sensor (FREESTYLE LIBRE SENSOR SYSTEM) MISC, 1 Device by Does not apply route 3 (three) times daily as needed., Disp: 6 each, Rfl: 2 .  dextromethorphan (DELSYM) 30 MG/5ML liquid, Take 5 mLs (30 mg total) by mouth 2 (two) times daily. (Patient not taking: Reported on 09/19/2018), Disp: 89 mL, Rfl: 0 .  EPINEPHrine (EPIPEN) 0.3 mg/0.3 mL IJ SOAJ injection, Inject 0.3 mLs (0.3 mg total) into the muscle once as needed (for severe allergic reaction)., Disp: 1 Device, Rfl: 1 .  glucose blood test strip, Use as instructed, Disp: 100 each, Rfl: 12 .  glucose monitoring kit (FREESTYLE) monitoring kit, 1 each by Does not apply route as needed for other., Disp: 1 each, Rfl: 0 .  Lancets (ONETOUCH DELICA PLUS [id number]) MISC, 1 Device by Does not apply route daily., Disp: 100 each, Rfl: 3 .  levonorgestrel-ethinyl estradiol (LEVORA 0.15/30, 28,) 0.15-30 MG-MCG tablet, Take 1 tablet by mouth daily., Disp: 3 Package, Rfl: 3 .  metFORMIN (GLUCOPHAGE) 500 MG tablet, Take 1 tablet (500 mg total) by mouth 2 (two) times daily with a meal., Disp: 180 tablet, Rfl: 1 .  pantoprazole (PROTONIX) 20 MG tablet, Take 1 tablet (20 mg total) by mouth 2 (two) times daily before a meal., Disp: 60 tablet, Rfl: 2  EXAM:  VITALS per patient if applicable:N/A GENERAL: alert, oriented, appears well and in no acute distress  HEENT: atraumatic, normocephalic,conjunctiva clear, no obvious abnormalities on inspection.  NECK: normal movements of the head and neck  LUNGS: on inspection no signs of respiratory distress, breathing rate appears normal, no obvious gross SOB, gasping or wheezing  CV: no obvious cyanosis  Kathleen: moves all visible extremities without noticeable  abnormality  PSYCH/NEURO: pleasant and cooperative, no obvious depression or anxiety, speech and thought processing grossly intact  ASSESSMENT AND PLAN:  Discussed the following assessment and plan:  Type 2 diabetes mellitus without complication, with long-term current use of insulin ([place]) - Plan: Based on reported BS's problem seems to be better controlled. Recommend discontinuing Lantus 10 U. No changes in Metformin but will increase dose or add another agent if needed. Continue monitoring BS's. Keep next appt.  Morbid obesity with BMI of 40.0-44.9, adult ([place]) - Plan: Encouraged to continue regular exercise and a healthful diet. Benefits os wt loss discussed.  Abscess of upper lobe of left lung with pneumonia ([place]) -  Plan: Symptoms have improved. Continue following with pulmonologist. In regard to date to return to work,recommend discussing this with pulmonologist.   26 min face to face OV. > 50% was dedicated to discussion of Dx, prognosis, treatment options, and some side effects of medications.  I discussed the assessment and treatment plan with the patient. She was provided an opportunity to ask questions and all were answered. She agreed with the plan and demonstrated an understanding of the instructions.    Return in about 3 months (around 01/18/2019).    Alitzel Cookson Martinique, MD

## 2018-10-23 ENCOUNTER — Encounter: Payer: Self-pay | Admitting: Family Medicine

## 2018-10-24 ENCOUNTER — Institutional Professional Consult (permissible substitution): Payer: No Typology Code available for payment source | Admitting: Internal Medicine

## 2018-10-29 ENCOUNTER — Other Ambulatory Visit: Payer: Self-pay | Admitting: Family Medicine

## 2018-10-29 DIAGNOSIS — Z91018 Allergy to other foods: Secondary | ICD-10-CM

## 2018-10-30 ENCOUNTER — Other Ambulatory Visit: Payer: Self-pay | Admitting: Family Medicine

## 2018-10-30 DIAGNOSIS — Z91018 Allergy to other foods: Secondary | ICD-10-CM

## 2018-10-31 ENCOUNTER — Ambulatory Visit (INDEPENDENT_AMBULATORY_CARE_PROVIDER_SITE_OTHER): Payer: No Typology Code available for payment source | Admitting: Family Medicine

## 2018-10-31 ENCOUNTER — Encounter: Payer: Self-pay | Admitting: Family Medicine

## 2018-10-31 ENCOUNTER — Other Ambulatory Visit: Payer: Self-pay

## 2018-10-31 DIAGNOSIS — J851 Abscess of lung with pneumonia: Secondary | ICD-10-CM

## 2018-10-31 DIAGNOSIS — R079 Chest pain, unspecified: Secondary | ICD-10-CM | POA: Diagnosis not present

## 2018-10-31 DIAGNOSIS — E119 Type 2 diabetes mellitus without complications: Secondary | ICD-10-CM

## 2018-10-31 DIAGNOSIS — Z91018 Allergy to other foods: Secondary | ICD-10-CM

## 2018-10-31 MED ORDER — EPINEPHRINE 0.3 MG/0.3ML IJ SOAJ: 0.3000 mg | Freq: Once | INTRAMUSCULAR | 1 refills | Status: AC | PRN

## 2018-10-31 NOTE — Telephone Encounter (Signed)
Copied from [place] [id number]. Topic: General - Other >> Oct 31, 2018 11:42 AM Pauline Good wrote: Reason for CRM: pt's job is having a hard time faxing pt's FMLA forms. Pt need nurse to fax over office treatment notes from each office visit fax to [phone] employee ID# [id number] claim # [phone] today >> Oct 31, 2018  2:15 PM Cox, Melburn Hake, CMA wrote: Spoke with pt and advised her we cannot send out records without the FMLA form or a signed release. She states her HR dept has tried to fax the forms over 7 times but never got a confirmation of receipt.   Roselyn Reef - can someone check Kathleen [name]'s paperwork for these forms? Pt is calling HIM to request records be sent.   Dr. Martinique do you recall getting any forms for this patient?

## 2018-10-31 NOTE — Assessment & Plan Note (Signed)
Problem has been better controlled but because we just discontinued Lantus about 2 weeks ago,I recommend continuing Metformin 500 mg bid. We will plan on checking HgA1C late 12/2018 or early 01/2019.

## 2018-10-31 NOTE — Progress Notes (Signed)
Virtual Visit via Video Note   I connected with Kathleen [name] on 10/31/18 at  8:00 AM EDT by a video enabled telemedicine application and verified that I am speaking with the correct person using two identifiers.  Location patient: home Location provider:home office Persons participating in the virtual visit: patient, provider  I discussed the limitations of evaluation and management by telemedicine and the availability of in person appointments. The patient expressed understanding and agreed to proceed.   HPI: Kathleen [name] is a 33 yo female ,who was last seen on 10/18/18. She needs FMLA completed, from 08/11/18 ,when she was first hospitalized with new onset of DM II.  Left lung pneumonia and lung abscess. Completed abx treatment,Metronidazole and Cefdinir. Followed with pulmonologist on 09/21/18. 10/18/18 not further pulmonary follow up needed because abscess has resolved.  Negative for fever,chills,fatigue,cough,wheezing,or dyspnea.  Still having minimal pain in left upper chest, it happens upon stretching and with deep breathing,worse when first gets up in the morning. She has had pain for 1-2 months and has improved.  Denies associated diaphoresis or skin rash. DM II, last OV Lantus was discontinued. BS's still running < 120. She is asking if she can stop Metformin now and continue non pharmacologic treatment.  Denies abdominal pain, nausea,vomiting, polydipsia,polyuria, or polyphagia.  She also needs a refills for her EpiPen,expired. Hx of food allergies. Negative for rhinorrhea,nasal congestion,or sore throat.  ROS: See pertinent positives and negatives per HPI.  Past Medical History:  Diagnosis Date  . Allergy   . Bronchitis   . Diabetes mellitus without complication ([place])   . Hypertension     No past surgical history on file.  Family History  Problem Relation Age of Onset  . Diabetes Father   . Diabetes Maternal Grandmother   . Cancer Neg Hx   . Hypertension Neg Hx    . Stroke Neg Hx     Social History   Socioeconomic History  . Marital status: Married    Spouse name: Not on file  . Number of children: Not on file  . Years of education: Not on file  . Highest education level: Not on file  Occupational History  . Not on file  Social Needs  . Financial resource strain: Not on file  . Food insecurity    Worry: Not on file    Inability: Not on file  . Transportation needs    Medical: Not on file    Non-medical: Not on file  Tobacco Use  . Smoking status: Never Smoker  . Smokeless tobacco: Never Used  Substance and Sexual Activity  . Alcohol use: Yes    Alcohol/week: 1.0 standard drinks    Types: 1 Standard drinks or equivalent per week  . Drug use: No  . Sexual activity: Yes    Partners: Male  Lifestyle  . Physical activity    Days per week: Not on file    Minutes per session: Not on file  . Stress: Not on file  Relationships  . Social Herbalist on phone: Not on file    Gets together: Not on file    Attends religious service: Not on file    Active member of club or organization: Not on file    Attends meetings of clubs or organizations: Not on file    Relationship status: Not on file  . Intimate partner violence    Fear of current or ex partner: Not on file    Emotionally abused: Not on  file    Physically abused: Not on file    Forced sexual activity: Not on file  Other Topics Concern  . Not on file  Social History Narrative  . Not on file      Current Outpatient Medications:  .  Alpha-D-Galactosidase (BEANO PO), Take 1 tablet by mouth 2 (two) times daily as needed (gas)., Disp: , Rfl:  .  Continuous Blood Gluc Receiver (FREESTYLE LIBRE READER) DEVI, 1 Device by Does not apply route as directed., Disp: 6 Device, Rfl: 2 .  Continuous Blood Gluc Sensor (FREESTYLE LIBRE SENSOR SYSTEM) MISC, 1 Device by Does not apply route 3 (three) times daily as needed., Disp: 6 each, Rfl: 2 .  EPINEPHrine (EPIPEN) 0.3 mg/0.3  mL IJ SOAJ injection, Inject 0.3 mLs (0.3 mg total) into the muscle once as needed (for severe allergic reaction)., Disp: 0.3 mL, Rfl: 1 .  glucose blood test strip, Use as instructed, Disp: 100 each, Rfl: 12 .  glucose monitoring kit (FREESTYLE) monitoring kit, 1 each by Does not apply route as needed for other., Disp: 1 each, Rfl: 0 .  Lancets (ONETOUCH DELICA PLUS [id number]) MISC, 1 Device by Does not apply route daily., Disp: 100 each, Rfl: 3 .  levonorgestrel-ethinyl estradiol (LEVORA 0.15/30, 28,) 0.15-30 MG-MCG tablet, Take 1 tablet by mouth daily., Disp: 3 Package, Rfl: 3 .  metFORMIN (GLUCOPHAGE) 500 MG tablet, Take 1 tablet (500 mg total) by mouth 2 (two) times daily with a meal., Disp: 180 tablet, Rfl: 1 .  pantoprazole (PROTONIX) 20 MG tablet, Take 1 tablet (20 mg total) by mouth 2 (two) times daily before a meal., Disp: 60 tablet, Rfl: 2  EXAM:  VITALS per patient if applicable:N/A  GENERAL: alert, oriented, appears well and in no acute distress  HEENT: atraumatic, normocephalic,conjunctiva clear.  NECK: normal movements of the head and neck  LUNGS: on inspection no signs of respiratory distress, breathing rate appears normal, no obvious gross SOB, gasping or wheezing  CV: no obvious cyanosis  PSYCH/NEURO: pleasant and cooperative, no obvious depression or anxiety, speech and thought processing grossly intact  ASSESSMENT AND PLAN:  Discussed the following assessment and plan:  Chest pain, unspecified type - Plan: Improved. I do not think further studies are necessary at this time. Incentive spirometry recommended,she has spirometer. Instructed about warning signs.  Multiple food allergies She has not had an event in a year. EpiPen refill sent to her pharmacy.   Pulmonary abscess (HCC) Resolved. Monitor for fever or respiratory symptoms. Instructed about warning signs.  Type 2 diabetes mellitus without complications ([place]) Problem has been better controlled but  because we just discontinued Lantus about 2 weeks ago,I recommend continuing Metformin 500 mg bid. We will plan on checking HgA1C late 12/2018 or early 01/2019.    I discussed the assessment and treatment plan with the patient. She was provided an opportunity to ask questions and all were answered. She agreed with the plan and demonstrated an understanding of the instructions.     Return in about 2 months (around 01/01/2019) for DM II.    Keilynn Marano Martinique, MD

## 2018-10-31 NOTE — Assessment & Plan Note (Signed)
Resolved. Monitor for fever or respiratory symptoms. Instructed about warning signs.

## 2018-10-31 NOTE — Assessment & Plan Note (Signed)
She has not had an event in a year. EpiPen refill sent to her pharmacy.

## 2018-11-14 ENCOUNTER — Telehealth (INDEPENDENT_AMBULATORY_CARE_PROVIDER_SITE_OTHER): Payer: No Typology Code available for payment source | Admitting: Family Medicine

## 2018-11-14 ENCOUNTER — Other Ambulatory Visit: Payer: Self-pay

## 2018-11-14 DIAGNOSIS — F419 Anxiety disorder, unspecified: Secondary | ICD-10-CM

## 2018-11-14 DIAGNOSIS — R079 Chest pain, unspecified: Secondary | ICD-10-CM | POA: Diagnosis not present

## 2018-11-14 NOTE — Progress Notes (Signed)
Virtual Visit via Video Note   I connected with Kathleen [name] on 11/14/18 by a video enabled telemedicine application and verified that I am speaking with the correct person using two identifiers.  Location patient: home Location provider:work or home office Persons participating in the virtual visit: patient, provider  I discussed the limitations of evaluation and management by telemedicine and the availability of in person appointments. The patient expressed understanding and agreed to proceed.   HPI: Kathleen [name] is a 33 yo female with Hx of DM II,HTN,and allergies who is c/o having left-sided chest pain, she thinks this may be related to anxiety. Until recently she was following with pulmonologist because left lug abscess,she was having same pain when she was diagnosed. Sharp,radiated to back,1-2/10. Alleviated by rubbing area.  While at work she had an episode of pain,exacerbated with deep breathing. Started with nausea and had vomiting x 3. She thinks symptoms were caused by anxiety.  She has not noted fever,chills,fatigue,changes in appetite, or skin rash. Denies palpitations,dyspnea,diaphoresis,or edema.  She has not taken OTC medication for pain.  No Hx of anxiety or depression. Denies heartburn,burping, or changes in bowel movements.  Requesting interm FMLA so she can leave or not go to work if she has symptoms. She also would like a CXR done.  Chest CT 10/18/18: Cavitary lesion left upper lobe seen on the most recent comparison chest CT has resolved most consistent with resolved infectious or inflammatory process. Linear scar at the site of the lesion is noted. The exam is otherwise negative.  ROS: See pertinent positives and negatives per HPI.  Past Medical History:  Diagnosis Date  . Allergy   . Bronchitis   . Diabetes mellitus without complication ([place])   . Hypertension     No past surgical history on file.  Family History  Problem Relation Age of Onset  .  Diabetes Father   . Diabetes Maternal Grandmother   . Cancer Neg Hx   . Hypertension Neg Hx   . Stroke Neg Hx     Social History   Socioeconomic History  . Marital status: Married    Spouse name: Not on file  . Number of children: Not on file  . Years of education: Not on file  . Highest education level: Not on file  Occupational History  . Not on file  Social Needs  . Financial resource strain: Not on file  . Food insecurity    Worry: Not on file    Inability: Not on file  . Transportation needs    Medical: Not on file    Non-medical: Not on file  Tobacco Use  . Smoking status: Never Smoker  . Smokeless tobacco: Never Used  Substance and Sexual Activity  . Alcohol use: Yes    Alcohol/week: 1.0 standard drinks    Types: 1 Standard drinks or equivalent per week  . Drug use: No  . Sexual activity: Yes    Partners: Male  Lifestyle  . Physical activity    Days per week: Not on file    Minutes per session: Not on file  . Stress: Not on file  Relationships  . Social Herbalist on phone: Not on file    Gets together: Not on file    Attends religious service: Not on file    Active member of club or organization: Not on file    Attends meetings of clubs or organizations: Not on file    Relationship status: Not on  file  . Intimate partner violence    Fear of current or ex partner: Not on file    Emotionally abused: Not on file    Physically abused: Not on file    Forced sexual activity: Not on file  Other Topics Concern  . Not on file  Social History Narrative  . Not on file      Current Outpatient Medications:  .  Alpha-D-Galactosidase (BEANO PO), Take 1 tablet by mouth 2 (two) times daily as needed (gas)., Disp: , Rfl:  .  Continuous Blood Gluc Receiver (FREESTYLE LIBRE READER) DEVI, 1 Device by Does not apply route as directed., Disp: 6 Device, Rfl: 2 .  Continuous Blood Gluc Sensor (FREESTYLE LIBRE SENSOR SYSTEM) MISC, 1 Device by Does not apply  route 3 (three) times daily as needed., Disp: 6 each, Rfl: 2 .  EPINEPHrine (EPIPEN) 0.3 mg/0.3 mL IJ SOAJ injection, Inject 0.3 mLs (0.3 mg total) into the muscle once as needed (for severe allergic reaction)., Disp: 0.3 mL, Rfl: 1 .  glucose blood test strip, Use as instructed, Disp: 100 each, Rfl: 12 .  glucose monitoring kit (FREESTYLE) monitoring kit, 1 each by Does not apply route as needed for other., Disp: 1 each, Rfl: 0 .  Lancets (ONETOUCH DELICA PLUS [id number]) MISC, 1 Device by Does not apply route daily., Disp: 100 each, Rfl: 3 .  levonorgestrel-ethinyl estradiol (LEVORA 0.15/30, 28,) 0.15-30 MG-MCG tablet, Take 1 tablet by mouth daily., Disp: 3 Package, Rfl: 3 .  metFORMIN (GLUCOPHAGE) 500 MG tablet, Take 1 tablet (500 mg total) by mouth 2 (two) times daily with a meal., Disp: 180 tablet, Rfl: 1 .  pantoprazole (PROTONIX) 20 MG tablet, Take 1 tablet (20 mg total) by mouth 2 (two) times daily before a meal., Disp: 60 tablet, Rfl: 2  EXAM:  VITALS per patient if applicable:N/A  GENERAL: alert, oriented, appears well and in no acute distress  HEENT: atraumatic, conjunctiva clear, no obvious abnormalities on inspection.  NECK: normal movements of the head and neck  LUNGS: on inspection no signs of respiratory distress, breathing rate appears normal, no obvious gross SOB, gasping or wheezing  CV: no obvious cyanosis  Kathleen: moves all visible extremities without noticeable abnormality  PSYCH/NEURO: pleasant and cooperative, no obvious depression, + Anxious.Speech and thought processing grossly intact  ASSESSMENT AND PLAN:  Discussed the following assessment and plan:  Chest pain, unspecified type - Plan: We discussed possible etiologies. Lung abscess has resolved. ? Musculoskeletal. There is an active order for CXR. Instructed about warning signs. Avoid shallow breathing.  Anxiety disorder, unspecified type - Plan: New problem. She is not interested in medication for  now. She will benefit for psychotherapy. FMLA for 8 hours per month x 3 months.    I discussed the assessment and treatment plan with the patient. She was provided an opportunity to ask questions and all were answered. She agreed with the plan and demonstrated an understanding of the instructions.   The patient was advised to call back or seek an in-person evaluation if the symptoms worsen or if the condition fails to improve as anticipated.  Follow up as needed.    Alvan Culpepper Martinique, MD

## 2018-11-15 ENCOUNTER — Telehealth: Payer: Self-pay | Admitting: Family Medicine

## 2018-11-15 NOTE — Telephone Encounter (Signed)
See note

## 2018-11-15 NOTE — Telephone Encounter (Signed)
Message sent to Dr. Jordan for review. Please advise 

## 2018-11-15 NOTE — Telephone Encounter (Signed)
Pt would like to wean off metformin. Pt would like to discuss  Please call [phone]

## 2018-11-17 NOTE — Telephone Encounter (Signed)
Patient wants instructions on how to wean off of medication.

## 2018-11-17 NOTE — Telephone Encounter (Signed)
I really would like for her to stay on metformin but she can discontinue metformin if she insists. Thanks, BJ

## 2018-11-17 NOTE — Telephone Encounter (Signed)
Spoke with patient and gave recommendations per Dr. Martinique. Patient understood recommendations but stated that she really wants to wean off of Metformin before she come in next month she wants it to be real readings without medicine in her system. Blood sugars stable, she will continue to monitor 4 times daily. Would like okay from PCP to go ahead and start weaning off of metformin.

## 2018-11-17 NOTE — Telephone Encounter (Signed)
I really would like to wait until we have the next A1c. We have made several changes in meds  since her diagnosis of DM 2 (08/2018) but we have not had a A1c follow-up. Thanks, BJ

## 2018-11-20 NOTE — Telephone Encounter (Signed)
Spoke with patient she states she will monitor her sugar 4 to 6 times a day that her sugar before meals have been in ranged and that she is portioning and eating healthy pt has also been exercising

## 2018-11-20 NOTE — Telephone Encounter (Signed)
Medication does not need to be weaned off. She can stop it if she decides to do so.  Thanks, BJ

## 2018-11-22 ENCOUNTER — Other Ambulatory Visit: Payer: Self-pay

## 2018-11-22 ENCOUNTER — Ambulatory Visit (INDEPENDENT_AMBULATORY_CARE_PROVIDER_SITE_OTHER): Payer: No Typology Code available for payment source

## 2018-11-22 ENCOUNTER — Other Ambulatory Visit: Payer: No Typology Code available for payment source

## 2018-11-22 DIAGNOSIS — R059 Cough, unspecified: Secondary | ICD-10-CM

## 2018-11-22 DIAGNOSIS — R05 Cough: Secondary | ICD-10-CM

## 2018-11-22 DIAGNOSIS — J851 Abscess of lung with pneumonia: Secondary | ICD-10-CM | POA: Diagnosis not present

## 2018-11-27 ENCOUNTER — Other Ambulatory Visit: Payer: Self-pay | Admitting: Family Medicine

## 2018-11-28 ENCOUNTER — Encounter: Payer: Self-pay | Admitting: Family Medicine

## 2018-12-01 ENCOUNTER — Other Ambulatory Visit: Payer: Self-pay | Admitting: Family Medicine

## 2018-12-01 MED ORDER — GLUCOSE BLOOD VI STRP: ORAL_STRIP | 6 refills | Status: DC

## 2018-12-26 ENCOUNTER — Telehealth (INDEPENDENT_AMBULATORY_CARE_PROVIDER_SITE_OTHER): Payer: No Typology Code available for payment source | Admitting: Family Medicine

## 2018-12-26 ENCOUNTER — Other Ambulatory Visit: Payer: Self-pay

## 2018-12-26 VITALS — Ht 65.0 in | Wt 192.0 lb

## 2018-12-26 DIAGNOSIS — E669 Obesity, unspecified: Secondary | ICD-10-CM

## 2018-12-26 DIAGNOSIS — I10 Essential (primary) hypertension: Secondary | ICD-10-CM | POA: Diagnosis not present

## 2018-12-26 DIAGNOSIS — E119 Type 2 diabetes mellitus without complications: Secondary | ICD-10-CM | POA: Diagnosis not present

## 2018-12-26 DIAGNOSIS — K219 Gastro-esophageal reflux disease without esophagitis: Secondary | ICD-10-CM

## 2018-12-26 DIAGNOSIS — Z6831 Body mass index (BMI) 31.0-31.9, adult: Secondary | ICD-10-CM

## 2018-12-26 DIAGNOSIS — F419 Anxiety disorder, unspecified: Secondary | ICD-10-CM

## 2018-12-26 NOTE — Progress Notes (Signed)
Virtual Visit via Video Note   I connected with Kathleen [name] on 12/26/18 by a video enabled telemedicine application and verified that I am speaking with the correct person using two identifiers.  Location patient: home Location provider:work office Persons participating in the virtual visit: patient, provider  I discussed the limitations of evaluation and management by telemedicine and the availability of in person appointments. The patient expressed understanding and agreed to proceed.   HPI: Kathleen [name] is a 33 yo female with Hx of HTN,obesity, and allergies who is following on some chronic problems today. No new problems sine her last visit. DM II, Dx in 08/2018. Discharged from hospital on 3/57/01, DM complicated with DKA. She was on Metformin,humalog, and Engineer, agricultural. She has been consistent with following a healthful diet and exercising regularly. She has lost wt. She was able to discontinued basaglar and Humalog and recently stopped Metformin (11/20/18).  FG 90's-110's. Post prandial up to 134.  Negative for dysuria,urinary frequency,gross hematuria,or decreased urine output.   Lab Results  Component Value Date   HGBA1C 11.3 (H) 09/06/2018  Denies abdominal pain, nausea,vomiting, polydipsia,polyuria, or polyphagia.  She is not monitoring BP at home. She is on non pharmacologic treatment. Denies severe/frequent headache, visual changes, chest pain, dyspnea, palpitation, claudication, focal weakness, or edema.  Lab Results  Component Value Date   CREATININE 0.73 09/08/2018   BUN 6 09/08/2018   NA 138 09/08/2018   K 3.5 09/08/2018   CL 111 09/08/2018   CO2 17 (L) 09/08/2018   Last visit she was c/o episodes of CP and anxiety,she has not had any since then.   GERD: She is not longer on Protonix, discontinued a few weeks ago, 12/08/18. She is avoiding food that aggravate symptoms and has not had any GI problem.  Denies heartburn,bloating sensation,heartburn,changes in bowel  habits, blood in stool or melena.  ROS: See pertinent positives and negatives per HPI.  Past Medical History:  Diagnosis Date  . Allergy   . Bronchitis   . Diabetes mellitus without complication ([place])   . Hypertension     No past surgical history on file.  Family History  Problem Relation Age of Onset  . Diabetes Father   . Diabetes Maternal Grandmother   . Cancer Neg Hx   . Hypertension Neg Hx   . Stroke Neg Hx     Social History   Socioeconomic History  . Marital status: Married    Spouse name: Not on file  . Number of children: Not on file  . Years of education: Not on file  . Highest education level: Not on file  Occupational History  . Not on file  Social Needs  . Financial resource strain: Not on file  . Food insecurity    Worry: Not on file    Inability: Not on file  . Transportation needs    Medical: Not on file    Non-medical: Not on file  Tobacco Use  . Smoking status: Never Smoker  . Smokeless tobacco: Never Used  Substance and Sexual Activity  . Alcohol use: Yes    Alcohol/week: 1.0 standard drinks    Types: 1 Standard drinks or equivalent per week  . Drug use: No  . Sexual activity: Yes    Partners: Male  Lifestyle  . Physical activity    Days per week: Not on file    Minutes per session: Not on file  . Stress: Not on file  Relationships  . Social connections  Talks on phone: Not on file    Gets together: Not on file    Attends religious service: Not on file    Active member of club or organization: Not on file    Attends meetings of clubs or organizations: Not on file    Relationship status: Not on file  . Intimate partner violence    Fear of current or ex partner: Not on file    Emotionally abused: Not on file    Physically abused: Not on file    Forced sexual activity: Not on file  Other Topics Concern  . Not on file  Social History Narrative  . Not on file     Current Outpatient Medications:  .  Alpha-D-Galactosidase  (BEANO PO), Take 1 tablet by mouth 2 (two) times daily as needed (gas)., Disp: , Rfl:  .  Continuous Blood Gluc Receiver (FREESTYLE LIBRE READER) DEVI, 1 Device by Does not apply route as directed., Disp: 6 Device, Rfl: 2 .  Continuous Blood Gluc Sensor (FREESTYLE LIBRE SENSOR SYSTEM) MISC, 1 Device by Does not apply route 3 (three) times daily as needed., Disp: 6 each, Rfl: 2 .  EPINEPHrine (EPIPEN) 0.3 mg/0.3 mL IJ SOAJ injection, Inject 0.3 mLs (0.3 mg total) into the muscle once as needed (for severe allergic reaction)., Disp: 0.3 mL, Rfl: 1 .  glucose blood test strip, Pt is checking BS up to 6 times per day., Disp: 180 each, Rfl: 6 .  glucose monitoring kit (FREESTYLE) monitoring kit, 1 each by Does not apply route as needed for other., Disp: 1 each, Rfl: 0 .  Lancets (ONETOUCH DELICA PLUS [id number]) MISC, 1 Device by Does not apply route daily., Disp: 100 each, Rfl: 3 .  levonorgestrel-ethinyl estradiol (LEVORA 0.15/30, 28,) 0.15-30 MG-MCG tablet, Take 1 tablet by mouth daily., Disp: 3 Package, Rfl: 3 .  metFORMIN (GLUCOPHAGE) 500 MG tablet, Take 1 tablet (500 mg total) by mouth 2 (two) times daily with a meal., Disp: 180 tablet, Rfl: 1 .  pantoprazole (PROTONIX) 20 MG tablet, TAKE 1 TABLET(20 MG) BY MOUTH TWICE DAILY BEFORE A MEAL, Disp: 60 tablet, Rfl: 2  EXAM:  VITALS per patient if applicable:Ht 5' 5" (7.169 m)   Wt 192 lb (87.1 kg)   BMI 31.95 kg/m   GENERAL: alert, oriented, appears well and in no acute distress  HEENT: atraumatic, conjunctiva clear, no obvious abnormalities on inspection.  NECK: normal movements of the head and neck  LUNGS: on inspection no signs of respiratory distress, breathing rate appears normal, no obvious gross SOB, gasping or wheezing  CV: no obvious cyanosis  Kathleen: moves all visible extremities without noticeable abnormality  PSYCH/NEURO: pleasant and cooperative, no obvious depression or anxiety, speech and thought processing grossly  intact  ASSESSMENT AND PLAN:  Discussed the following assessment and plan:  Type 2 diabetes mellitus without complication, without long-term current use of insulin (HCC) - Plan: Basic metabolic panel, Hemoglobin A1c, Fructosamine, Microalbumin / creatinine urine ratio Reported BS's suggest that problem has been well controlled with non pharmacologic treatment. HgA1C will be arranged. Regular exercise and healthy diet with avoidance of added sugar food intake is an important part of treatment and recommended. Annual eye exam, periodic dental and foot care recommended. F/U in 5-6 months  Benign essential hypertension - Plan: Basic metabolic panel Recommend monitoring BP at home. Continue low salt diet. Possible complications of elevated BP discussed.  Gastroesophageal reflux disease, esophagitis presence not specified Well controlled with GERD precautions and wt loss.  Class 1 obesity with serious comorbidity and body mass index (BMI) of 31.0 to 31.9 in adult, unspecified obesity type She has lost about 29 lbs.  Encouraged to continue following a healthful diet and regular physical activity.  Anxiety disorder, unspecified type Improved. F/U as needed   I discussed the assessment and treatment plan with the patient. She was provided an opportunity to ask questions and all were answered. She agreed with the plan and demonstrated an understanding of the instructions.   Return in about 6 months (around 06/25/2019) for Labs in 1-2 weeks.    Betty Martinique, MD

## 2018-12-28 ENCOUNTER — Encounter: Payer: Self-pay | Admitting: Family Medicine

## 2019-01-03 ENCOUNTER — Other Ambulatory Visit: Payer: Self-pay

## 2019-01-03 ENCOUNTER — Other Ambulatory Visit (INDEPENDENT_AMBULATORY_CARE_PROVIDER_SITE_OTHER): Payer: No Typology Code available for payment source

## 2019-01-03 DIAGNOSIS — I10 Essential (primary) hypertension: Secondary | ICD-10-CM | POA: Diagnosis not present

## 2019-01-03 DIAGNOSIS — E119 Type 2 diabetes mellitus without complications: Secondary | ICD-10-CM | POA: Diagnosis not present

## 2019-01-03 LAB — MICROALBUMIN / CREATININE URINE RATIO
Creatinine,U: 244 mg/dL
Microalb Creat Ratio: 0.3 mg/g (ref 0.0–30.0)
Microalb, Ur: 0.8 mg/dL (ref 0.0–1.9)

## 2019-01-03 LAB — BASIC METABOLIC PANEL
BUN: 10 mg/dL (ref 6–23)
CO2: 23 mEq/L (ref 19–32)
Calcium: 9 mg/dL (ref 8.4–10.5)
Chloride: 109 mEq/L (ref 96–112)
Creatinine, Ser: 0.86 mg/dL (ref 0.40–1.20)
GFR: 92.75 mL/min (ref >60.00)
Glucose, Bld: 89 mg/dL (ref 70–99)
Potassium: 4.5 mEq/L (ref 3.5–5.1)
Sodium: 139 mEq/L (ref 135–145)

## 2019-01-03 LAB — HEMOGLOBIN A1C: Hgb A1c MFr Bld: 6.3 % (ref 4.6–6.5)

## 2019-01-08 LAB — FRUCTOSAMINE: Fructosamine: 257 umol/L (ref 205–285)

## 2019-01-10 ENCOUNTER — Encounter: Payer: Self-pay | Admitting: Family Medicine

## 2019-03-08 ENCOUNTER — Encounter: Payer: Self-pay | Admitting: Family Medicine

## 2019-03-09 ENCOUNTER — Other Ambulatory Visit: Payer: No Typology Code available for payment source

## 2019-04-20 ENCOUNTER — Telehealth: Payer: Self-pay | Admitting: *Deleted

## 2019-04-20 NOTE — Telephone Encounter (Signed)
Returned call to patient, appointment scheduled.  Copied from CRM [id number]. Topic: Appointment Scheduling - Scheduling Inquiry for Clinic >> Apr 20, 2019  8:03 AM Leafy Ro wrote: Pt is calling to sch a virtual appt on 04-25-2019 follow up on dm. I called office and the call was d/c

## 2019-04-25 ENCOUNTER — Telehealth (INDEPENDENT_AMBULATORY_CARE_PROVIDER_SITE_OTHER): Payer: No Typology Code available for payment source | Admitting: Family Medicine

## 2019-04-25 ENCOUNTER — Encounter: Payer: Self-pay | Admitting: Family Medicine

## 2019-04-25 VITALS — Wt 181.0 lb

## 2019-04-25 DIAGNOSIS — I10 Essential (primary) hypertension: Secondary | ICD-10-CM

## 2019-04-25 DIAGNOSIS — E669 Obesity, unspecified: Secondary | ICD-10-CM | POA: Diagnosis not present

## 2019-04-25 DIAGNOSIS — E119 Type 2 diabetes mellitus without complications: Secondary | ICD-10-CM

## 2019-04-25 DIAGNOSIS — E785 Hyperlipidemia, unspecified: Secondary | ICD-10-CM

## 2019-04-25 NOTE — Progress Notes (Signed)
Virtual Visit via Video Note   I connected with Kathleen [name] on 04/25/19 by a video enabled telemedicine application and verified that I am speaking with the correct person using two identifiers.  Location patient: home Location provider:work office Persons participating in the virtual visit: patient, provider  I discussed the limitations of evaluation and management by telemedicine and the availability of in person appointments. The patient expressed understanding and agreed to proceed.   HPI: Kathleen [name] is a 34 yo female being seen today for chronic disease management.  DM II: Dx'ed in 08/2018. She was started on insulin, then changed to Metformin and now she is on non -pharmacologic treatment. She is checking BS's 2 times daily. BS's 90's-130's. Denies abdominal pain, nausea,vomiting, polydipsia,polyuria, or polyphagia.  Lab Results  Component Value Date   HGBA1C 6.3 01/03/2019   Lab Results  Component Value Date   MICROALBUR 0.8 01/03/2019   HTN: On non pharmacologic treatment. She was on Coreg for a couple months. She has not checked BP for about 2 months. Denies severe/frequent headache, visual changes, chest pain, dyspnea, palpitation, claudication, focal weakness, or edema.  Lab Results  Component Value Date   CREATININE 0.86 01/03/2019   BUN 10 01/03/2019   NA 139 01/03/2019   K 4.5 01/03/2019   CL 109 01/03/2019   CO2 23 01/03/2019   Dyslipidemia: On low fat diet.  Lab Results  Component Value Date   CHOL 158 08/12/2018   HDL 31 (L) 08/12/2018   LDLCALC NOT CALCULATED 08/12/2018   TRIG 105 08/12/2018   CHOLHDL 5.1 08/12/2018   She has kept up with following a healthful diet and physical activity. She is eating mainly organic foods. BMI was 40.4 at the time of DM dx. She has a food diary.  ROS: See pertinent positives and negatives per HPI.  Past Medical History:  Diagnosis Date  . Allergy   . Bronchitis   . Diabetes mellitus without complication  ([place])   . Hypertension     No past surgical history on file.  Family History  Problem Relation Age of Onset  . Diabetes Father   . Diabetes Maternal Grandmother   . Cancer Neg Hx   . Hypertension Neg Hx   . Stroke Neg Hx     Social History   Socioeconomic History  . Marital status: Married    Spouse name: Not on file  . Number of children: Not on file  . Years of education: Not on file  . Highest education level: Not on file  Occupational History  . Not on file  Tobacco Use  . Smoking status: Never Smoker  . Smokeless tobacco: Never Used  Substance and Sexual Activity  . Alcohol use: Yes    Alcohol/week: 1.0 standard drinks    Types: 1 Standard drinks or equivalent per week  . Drug use: No  . Sexual activity: Yes    Partners: Male  Other Topics Concern  . Not on file  Social History Narrative  . Not on file   Social Determinants of Health   Financial Resource Strain:   . Difficulty of Paying Living Expenses: Not on file  Food Insecurity:   . Worried About Charity fundraiser in the Last Year: Not on file  . Ran Out of Food in the Last Year: Not on file  Transportation Needs:   . Lack of Transportation (Medical): Not on file  . Lack of Transportation (Non-Medical): Not on file  Physical Activity:   .  Days of Exercise per Week: Not on file  . Minutes of Exercise per Session: Not on file  Stress:   . Feeling of Stress : Not on file  Social Connections:   . Frequency of Communication with Friends and Family: Not on file  . Frequency of Social Gatherings with Friends and Family: Not on file  . Attends Religious Services: Not on file  . Active Member of Clubs or Organizations: Not on file  . Attends Archivist Meetings: Not on file  . Marital Status: Not on file  Intimate Partner Violence:   . Fear of Current or Ex-Partner: Not on file  . Emotionally Abused: Not on file  . Physically Abused: Not on file  . Sexually Abused: Not on file     Current Outpatient Medications:  .  Alpha-D-Galactosidase (BEANO PO), Take 1 tablet by mouth 2 (two) times daily as needed (gas)., Disp: , Rfl:  .  Continuous Blood Gluc Receiver (FREESTYLE LIBRE READER) DEVI, 1 Device by Does not apply route as directed., Disp: 6 Device, Rfl: 2 .  Continuous Blood Gluc Sensor (FREESTYLE LIBRE SENSOR SYSTEM) MISC, 1 Device by Does not apply route 3 (three) times daily as needed., Disp: 6 each, Rfl: 2 .  EPINEPHrine (EPIPEN) 0.3 mg/0.3 mL IJ SOAJ injection, Inject 0.3 mLs (0.3 mg total) into the muscle once as needed (for severe allergic reaction)., Disp: 0.3 mL, Rfl: 1 .  glucose blood test strip, Pt is checking BS up to 6 times per day., Disp: 180 each, Rfl: 6 .  glucose monitoring kit (FREESTYLE) monitoring kit, 1 each by Does not apply route as needed for other., Disp: 1 each, Rfl: 0 .  Lancets (ONETOUCH DELICA PLUS [id number]) MISC, 1 Device by Does not apply route daily., Disp: 100 each, Rfl: 3 .  levonorgestrel-ethinyl estradiol (LEVORA 0.15/30, 28,) 0.15-30 MG-MCG tablet, Take 1 tablet by mouth daily., Disp: 3 Package, Rfl: 3 .  metFORMIN (GLUCOPHAGE) 500 MG tablet, Take 1 tablet (500 mg total) by mouth 2 (two) times daily with a meal., Disp: 180 tablet, Rfl: 1 .  pantoprazole (PROTONIX) 20 MG tablet, TAKE 1 TABLET(20 MG) BY MOUTH TWICE DAILY BEFORE A MEAL, Disp: 60 tablet, Rfl: 2  EXAM:  VITALS per patient if applicable:Wt 181 lb (82.1 kg)   BMI 30.12 kg/m   Wt Readings from Last 3 Encounters:  04/25/19 181 lb (82.1 kg)  12/28/18 192 lb (87.1 kg)  10/18/18 204 lb (92.5 kg)   GENERAL: alert, oriented, appears well and in no acute distress  HEENT: atraumatic, conjunctiva clear, no obvious abnormalities on inspection.  NECK: normal movements of the head and neck  LUNGS: on inspection no signs of respiratory distress, breathing rate appears normal, no obvious gross SOB, gasping or wheezing  CV: no obvious cyanosis  Kathleen: moves all visible  extremities without noticeable abnormality  PSYCH/NEURO: pleasant and cooperative, no obvious depression or anxiety, speech and thought processing grossly intact  ASSESSMENT AND PLAN:  Discussed the following assessment and plan: Orders Placed This Encounter  Procedures  . Hemoglobin A1c  . Basic metabolic panel   Type 2 diabetes mellitus without complication, without long-term current use of insulin ([place]) HgA1C has been at goal. Continue non pharmacologic treatment. She can check BS's 2-3 times per week and prn. Important eye exam at least once per year, periodic dental and foot care. F/U in 5-6 months  Benign essential hypertension On non pharmacologic treatment. BP has been adequate. Continue low salt diet.  Obesity, Class I, BMI 30-34.9 She is doing great! She went from BMI 40 to 30. She was encouraged to continue with following a healthful diet and regular physical activity.  Dyslipidemia We discussed some benefits of statin meds in regard to CVD prevention. She prefers to hold on medication for now.    I discussed the assessment and treatment plan with the patient. Kathleen [name] was provided an opportunity to ask questions and all were answered. She agreed with the plan and demonstrated an understanding of the instructions.    Return in about 6 months (around 10/23/2019) for Labs in 1-2 weeks,wednesday is better for her..    [place], MD

## 2019-05-10 NOTE — Progress Notes (Signed)
The patient is scheduled for a virtual follow up appointment with [place] on 10/17/2019 at 10:30 AM

## 2019-06-24 ENCOUNTER — Other Ambulatory Visit: Payer: Self-pay | Admitting: Family Medicine

## 2019-06-25 ENCOUNTER — Ambulatory Visit: Payer: No Typology Code available for payment source | Admitting: Family Medicine

## 2019-07-20 ENCOUNTER — Other Ambulatory Visit: Payer: Self-pay | Admitting: *Deleted

## 2019-07-20 ENCOUNTER — Encounter: Payer: Self-pay | Admitting: Family Medicine

## 2019-07-20 MED ORDER — LEVONORGESTREL-ETHINYL ESTRAD 0.15-30 MG-MCG PO TABS: 1.0000 | ORAL_TABLET | Freq: Every day | ORAL | 1 refills | Status: DC

## 2019-10-17 ENCOUNTER — Telehealth (INDEPENDENT_AMBULATORY_CARE_PROVIDER_SITE_OTHER): Payer: Self-pay | Admitting: Family Medicine

## 2019-10-17 ENCOUNTER — Encounter: Payer: Self-pay | Admitting: Family Medicine

## 2019-10-17 VITALS — Ht 65.0 in | Wt 159.0 lb

## 2019-10-17 DIAGNOSIS — Z794 Long term (current) use of insulin: Secondary | ICD-10-CM

## 2019-10-17 DIAGNOSIS — E119 Type 2 diabetes mellitus without complications: Secondary | ICD-10-CM

## 2019-10-17 DIAGNOSIS — Z6826 Body mass index (BMI) 26.0-26.9, adult: Secondary | ICD-10-CM

## 2019-10-17 DIAGNOSIS — E785 Hyperlipidemia, unspecified: Secondary | ICD-10-CM

## 2019-10-17 DIAGNOSIS — Z1159 Encounter for screening for other viral diseases: Secondary | ICD-10-CM

## 2019-10-17 NOTE — Progress Notes (Signed)
Virtual Visit via Video Note I connected with Kathleen [name] on 10/17/19 by a video enabled telemedicine application and verified that I am speaking with the correct person using two identifiers.  Location patient: home Location provider:work office Persons participating in the virtual visit: patient, provider  I discussed the limitations of evaluation and management by telemedicine and the availability of in person appointments. The patient expressed understanding and agreed to proceed.  HPI: Kathleen [name] 34 yo female being seen today for chronic disease management. She was last seen on 04/25/19,virtual visit. No new problems since her last visit.  DM II: Dx'ed in 08/2018. She is on non pharmacologic treatment. FG: 90's-110's. Post prandial < 130. She has lost wt,now wt is 159 LB,  Her goal is135-145 Lb.  Negative for abdominal pain, nausea,vomiting, polydipsia,polyuria, or polyphagia.  Lab Results  Component Value Date   HGBA1C 6.3 01/03/2019   Lab Results  Component Value Date   MICROALBUR 0.8 01/03/2019   Negative for severe/frequent headache, visual changes, chest pain, dyspnea, palpitation, claudication, focal weakness, or edema.  Lab Results  Component Value Date   CREATININE 0.86 01/03/2019   BUN 10 01/03/2019   NA 139 01/03/2019   K 4.5 01/03/2019   CL 109 01/03/2019   CO2 23 01/03/2019   Since April she has been exercising intensely, she has a Systems analyst, an hour 3 times per week.  She decided to take a break and planning on resuming exercise in 12/2019.Some exercises were causing knee pain. She is also doing elliptical 15 to 30 minutes daily,which she plans to continue.  Dyslipidemia: She is on non pharmacologic treatment.  Lab Results  Component Value Date   CHOL 158 08/12/2018   HDL 31 (L) 08/12/2018   LDLCALC NOT CALCULATED 08/12/2018   TRIG 105 08/12/2018   CHOLHDL 5.1 08/12/2018    ROS: See pertinent positives and negatives per HPI.  Past Medical  History:  Diagnosis Date  . Allergy   . Bronchitis   . Diabetes mellitus without complication ([facility])   . Hypertension     History reviewed. No pertinent surgical history.  Family History  Problem Relation Age of Onset  . Diabetes Father   . Diabetes Maternal Grandmother   . Cancer Neg Hx   . Hypertension Neg Hx   . Stroke Neg Hx     Social History   Socioeconomic History  . Marital status: Married    Spouse name: Not on file  . Number of children: Not on file  . Years of education: Not on file  . Highest education level: Not on file  Occupational History  . Not on file  Tobacco Use  . Smoking status: Never Smoker  . Smokeless tobacco: Never Used  Substance and Sexual Activity  . Alcohol use: Yes    Alcohol/week: 1.0 standard drink    Types: 1 Standard drinks or equivalent per week  . Drug use: No  . Sexual activity: Yes    Partners: Male  Other Topics Concern  . Not on file  Social History Narrative  . Not on file   Social Determinants of Health   Financial Resource Strain:   . Difficulty of Paying Living Expenses:   Food Insecurity:   . Worried About Programme researcher, broadcasting/film/video in the Last Year:   . Barista in the Last Year:   Transportation Needs:   . Freight forwarder (Medical):   Marland Kitchen Lack of Transportation (Non-Medical):   Physical Activity:   .  Days of Exercise per Week:   . Minutes of Exercise per Session:   Stress:   . Feeling of Stress :   Social Connections:   . Frequency of Communication with Friends and Family:   . Frequency of Social Gatherings with Friends and Family:   . Attends Religious Services:   . Active Member of Clubs or Organizations:   . Attends Banker Meetings:   Marland Kitchen Marital Status:   Intimate Partner Violence:   . Fear of Current or Ex-Partner:   . Emotionally Abused:   Marland Kitchen Physically Abused:   . Sexually Abused:     Current Outpatient Medications:  .  EPINEPHrine (EPIPEN) 0.3 mg/0.3 mL IJ SOAJ injection,  Inject 0.3 mLs (0.3 mg total) into the muscle once as needed (for severe allergic reaction)., Disp: 0.3 mL, Rfl: 1 .  glucose blood test strip, Pt is checking BS up to 6 times per day., Disp: 180 each, Rfl: 6 .  Lancets (ONETOUCH DELICA PLUS LANCET33G) MISC, 1 Device by Does not apply route daily., Disp: 100 each, Rfl: 3 .  levonorgestrel-ethinyl estradiol (KURVELO) 0.15-30 MG-MCG tablet, Take 1 tablet by mouth daily., Disp: 84 tablet, Rfl: 1  EXAM:  VITALS per patient if applicable:Ht 5\' 5"  (1.651 m)   Wt 159 lb (72.1 kg)   BMI 26.46 kg/m   GENERAL: alert, oriented, appears well and in no acute distress  HEENT: atraumatic, conjunctiva clear, no obvious abnormalities on inspection.  NECK: normal movements of the head and neck  LUNGS: on inspection no signs of respiratory distress, breathing rate appears normal, no obvious gross SOB, gasping or wheezing  CV: no obvious cyanosis  Kathleen: moves all visible extremities without noticeable abnormality  PSYCH/NEURO: pleasant and cooperative, no obvious depression or anxiety, speech and thought processing grossly intact  ASSESSMENT AND PLAN:  Discussed the following assessment and plan:  Type 2 diabetes mellitus without complication, with long-term current use of insulin ([facility]) - Plan: Hemoglobin A1c, Basic metabolic panel HgA1C has been at goal. Continue non pharmacologic treatment. Regular exercise and healthy diet with avoidance of added sugar food intake is an important part of treatment and recommended. Annual eye exam (overdue), periodic dental and foot care recommended. F/U in 5-6 months  Dyslipidemia - Plan: Lipid panel Continue non pharmacologic treatment. Further recommendations will be given according to FLP result.  Encounter for HCV screening test for low risk patient - Plan: Hepatitis C antibody  Body mass index 26.0-26.9, adult When she was initially dx'ed with DM II ,her BMI was 40. Encouraged to continue following a  healthful diet and regular physical activity. Low impact due to knee pain.  Future lab orders placed.   I discussed the assessment and treatment plan with the patient. Kathleen [name] was provided an opportunity to ask questions and all were answered. She agreed with the plan and demonstrated an understanding of the instructions.   The patient was advised to call back or seek an in-person evaluation if the symptoms worsen or if the condition fails to improve as anticipated.  Return in about 6 months (around 04/18/2020) for Labs in 11/2019.    Demario Faniel 12/2019, MD

## 2019-11-11 ENCOUNTER — Encounter: Payer: Self-pay | Admitting: Family Medicine

## 2019-11-12 MED ORDER — LEVONORGESTREL-ETHINYL ESTRAD 0.15-30 MG-MCG PO TABS: 1.0000 | ORAL_TABLET | Freq: Every day | ORAL | 3 refills | Status: DC

## 2020-03-27 ENCOUNTER — Other Ambulatory Visit: Payer: Self-pay | Admitting: Family Medicine

## 2020-12-02 ENCOUNTER — Other Ambulatory Visit: Payer: Self-pay | Admitting: Family Medicine

## 2021-02-14 ENCOUNTER — Other Ambulatory Visit: Payer: Self-pay | Admitting: Family Medicine

## 2021-02-14 ENCOUNTER — Encounter: Payer: Self-pay | Admitting: Family Medicine

## 2021-02-16 MED ORDER — LEVONORGESTREL-ETHINYL ESTRAD 0.15-30 MG-MCG PO TABS: 1.0000 | ORAL_TABLET | Freq: Every day | ORAL | 0 refills | Status: DC

## 2021-03-01 ENCOUNTER — Other Ambulatory Visit: Payer: Self-pay | Admitting: Family Medicine

## 2021-03-02 ENCOUNTER — Telehealth: Payer: Self-pay

## 2021-03-02 MED ORDER — LEVONORGESTREL-ETHINYL ESTRAD 0.15-30 MG-MCG PO TABS: 1.0000 | ORAL_TABLET | Freq: Every day | ORAL | 0 refills | Status: DC

## 2021-03-02 NOTE — Telephone Encounter (Signed)
Pt called Access Nurse on 03/01/21: "Caller states she had a prescription for her birth control but it is only 28 day supply and she would like a 90 day supply. If she gets the 28 day supply it will cost more than getting the 90 day supply."  LVM instructions for pt to call office to schedule CPE with PCP.

## 2021-03-06 ENCOUNTER — Encounter: Payer: Self-pay | Admitting: Family Medicine

## 2021-03-16 NOTE — Progress Notes (Signed)
HPI: Kathleen [name] is a 35 y.o. female, who is here today for her routine physical.  Last CPE: 04/2015.  Regular exercise 3 or more time per week: Elliptical and walking a few times per week. Following a healthy diet: She cooks with olive oil, eats plenty of salad and vegetables.Limiting sweets and carbs intake. She lives with grandfather and husband.  Chronic medical problems: DM II,HLD,and asthma among some.   There is no immunization history on file for this patient. Health Maintenance  Topic Date Due   OPHTHALMOLOGY EXAM  Never done   TETANUS/TDAP  Never done   COVID-19 Vaccine (1) 04/01/2021 (Originally 11/20/1985)   INFLUENZA VACCINE  07/03/2021 (Originally 11/03/2020)   HEMOGLOBIN A1C  09/15/2021   FOOT EXAM  03/17/2022   URINE MICROALBUMIN  03/17/2022   PAP SMEAR-Modifier  03/17/2024   Hepatitis C Screening  Completed   HIV Screening  Completed   HPV VACCINES  Aged Out   Pneumococcal Vaccine 28-12 Years old  Discontinued   She has no concerns today. DM II: Dx'ed in 08/2018. She is on non pharmacologic treatment. BS's in the low 100's, occasionally 200's when she eats certain foods.  Lab Results  Component Value Date   HGBA1C 6.3 01/03/2019   HLD: On non pharmacologic treatment.  Lab Results  Component Value Date   CHOL 158 08/12/2018   HDL 31 (L) 08/12/2018   LDLCALC NOT CALCULATED 08/12/2018   TRIG 105 08/12/2018   CHOLHDL 5.1 08/12/2018   Review of Systems  Constitutional:  Negative for appetite change, fatigue and fever.  HENT:  Negative for hearing loss, mouth sores, sore throat, trouble swallowing and voice change.   Eyes:  Negative for redness and visual disturbance.  Respiratory:  Negative for cough, shortness of breath and wheezing.   Cardiovascular:  Negative for chest pain, palpitations and leg swelling.  Gastrointestinal:  Negative for abdominal pain, nausea and vomiting.       No changes in bowel habits.  Endocrine: Negative for  cold intolerance, heat intolerance, polydipsia, polyphagia and polyuria.  Genitourinary:  Negative for decreased urine volume, dysuria, hematuria, vaginal bleeding and vaginal discharge.  Musculoskeletal:  Negative for arthralgias, gait problem and myalgias.  Skin:  Negative for color change and rash.  Allergic/Immunologic: Positive for environmental allergies.  Neurological:  Negative for syncope, weakness and headaches.  Hematological:  Negative for adenopathy. Does not bruise/bleed easily.  Psychiatric/Behavioral:  Negative for confusion and sleep disturbance.   All other systems reviewed and are negative.  Current Outpatient Medications on File Prior to Visit  Medication Sig Dispense Refill   EPINEPHrine (EPIPEN) 0.3 mg/0.3 mL IJ SOAJ injection Inject 0.3 mLs (0.3 mg total) into the muscle once as needed (for severe allergic reaction). 0.3 mL 1   glucose blood test strip Pt is checking BS up to 6 times per day. 180 each 6   Lancets (ONETOUCH DELICA PLUS LANCET33G) MISC 1 Device by Does not apply route daily. 100 each 3   levonorgestrel-ethinyl estradiol (KURVELO) 0.15-30 MG-MCG tablet Take 1 tablet by mouth daily. 28 tablet 0   No current facility-administered medications on file prior to visit.   Past Medical History:  Diagnosis Date   Allergy    Bronchitis    Diabetes mellitus without complication (HCC)    Hypertension    History reviewed. No pertinent surgical history.  Allergies  Allergen Reactions   Amoxicillin Other (See Comments)    Pain in back   Apple Anaphylaxis  Peaches, plums, pears, bananas, grapes, kiwi, all melons. CAN HAVE BLUEBERRIES AND STRAWBERRIES   Corn-Containing Products Anaphylaxis and Hives   Other Anaphylaxis   Penicillins Other (See Comments)    Did it involve swelling of the face/tongue/throat, SOB, or low BP? Yes Did it involve sudden or severe rash/hives, skin peeling, or any reaction on the inside of your mouth or nose? Yes Did you need to  seek medical attention at a hospital or doctor's office? Was already at hospital When did it last happen?  2010 If all above answers are "NO", may proceed with cephalosporin use.    Soy Allergy Anaphylaxis   Fish Allergy    Fruit & Vegetable Daily [Nutritional Supplements]     Allergy to fruits- Grapes, pears, peaches, apples, plums, bananas, kiwi, melons and mangoes.    Peanut-Containing Drug Products Hives    ALL NUTS!   Shellfish Allergy Hives, Itching and Swelling   Wheat Hives and Itching    Family History  Problem Relation Age of Onset   Diabetes Father    Diabetes Maternal Grandmother    Cancer Neg Hx    Hypertension Neg Hx    Stroke Neg Hx     Social History   Socioeconomic History   Marital status: Married    Spouse name: Not on file   Number of children: Not on file   Years of education: Not on file   Highest education level: Not on file  Occupational History   Not on file  Tobacco Use   Smoking status: Never   Smokeless tobacco: Never  Substance and Sexual Activity   Alcohol use: Yes    Alcohol/week: 1.0 standard drink    Types: 1 Standard drinks or equivalent per week   Drug use: No   Sexual activity: Yes    Partners: Male  Other Topics Concern   Not on file  Social History Narrative   Not on file   Social Determinants of Health   Financial Resource Strain: Not on file  Food Insecurity: Not on file  Transportation Needs: Not on file  Physical Activity: Not on file  Stress: Not on file  Social Connections: Not on file   Vitals:   03/17/21 0852  BP: 124/80  Pulse: 76  Resp: 16   Body mass index is 26.98 kg/m.  Wt Readings from Last 3 Encounters:  03/17/21 162 lb 2 oz (73.5 kg)  10/17/19 159 lb (72.1 kg)  04/25/19 181 lb (82.1 kg)     Physical Exam Vitals and nursing note reviewed. Exam conducted with a chaperone present.  Constitutional:      General: She is not in acute distress.    Appearance: She is well-developed.  HENT:      Head: Normocephalic and atraumatic.     Right Ear: Hearing, tympanic membrane, ear canal and external ear normal.     Left Ear: Hearing, tympanic membrane, ear canal and external ear normal.     Mouth/Throat:     Mouth: Mucous membranes are moist.     Pharynx: Oropharynx is clear. Uvula midline.  Eyes:     Extraocular Movements: Extraocular movements intact.     Conjunctiva/sclera: Conjunctivae normal.     Pupils: Pupils are equal, round, and reactive to light.  Neck:     Thyroid: No thyromegaly.     Trachea: No tracheal deviation.  Cardiovascular:     Rate and Rhythm: Normal rate and regular rhythm.     Pulses:  Dorsalis pedis pulses are 2+ on the right side and 2+ on the left side.       Posterior tibial pulses are 2+ on the right side and 2+ on the left side.     Heart sounds: No murmur heard. Pulmonary:     Effort: Pulmonary effort is normal. No respiratory distress.     Breath sounds: Normal breath sounds.  Abdominal:     Palpations: Abdomen is soft. There is no hepatomegaly or mass.     Tenderness: There is no abdominal tenderness.  Genitourinary:    Exam position: Lithotomy position.     Labia:        Right: No rash, tenderness or lesion.        Left: No rash, tenderness or lesion.      Cervix: No cervical motion tenderness, discharge or friability.     Uterus: Not enlarged and not tender.      Adnexa:        Right: No mass, tenderness or fullness.         Left: No mass, tenderness or fullness.       Comments: Deferred to gyn. Musculoskeletal:     Comments: No major deformity or signs of synovitis appreciated.  Lymphadenopathy:     Cervical: No cervical adenopathy.     Upper Body:     Right upper body: No supraclavicular adenopathy.     Left upper body: No supraclavicular adenopathy.  Skin:    General: Skin is warm.     Findings: No erythema or rash.  Neurological:     General: No focal deficit present.     Mental Status: She is alert and oriented to  person, place, and time.     Cranial Nerves: No cranial nerve deficit.     Coordination: Coordination normal.     Gait: Gait normal.     Deep Tendon Reflexes:     Reflex Scores:      Bicep reflexes are 2+ on the right side and 2+ on the left side.      Patellar reflexes are 2+ on the right side and 2+ on the left side. Psychiatric:        Speech: Speech normal.     Comments: Well groomed, good eye contact.   Diabetic Foot Exam - Simple   Simple Foot Form Diabetic Foot exam was performed with the following findings: Yes 03/17/2021 10:04 AM  Visual Inspection No deformities, no ulcerations, no other skin breakdown bilaterally: Yes Sensation Testing Intact to touch and monofilament testing bilaterally: Yes Pulse Check Posterior Tibialis and Dorsalis pulse intact bilaterally: Yes Comments     ASSESSMENT AND PLAN:  Ms. Kathleen [name] was here today annual physical examination.  Orders Placed This Encounter  Procedures   Hemoglobin A1c   Microalbumin / creatinine urine ratio   Comprehensive metabolic panel   Lipid panel   Hepatitis C antibody   Lab Results  Component Value Date   CHOL 205 (H) 03/17/2021   HDL 59.10 03/17/2021   LDLCALC 124 (H) 03/17/2021   TRIG 105.0 03/17/2021   CHOLHDL 3 03/17/2021   Lab Results  Component Value Date   HGBA1C 15.2 (H) 03/17/2021   Lab Results  Component Value Date   CREATININE 0.83 03/17/2021   BUN 13 03/17/2021   NA 135 03/17/2021   K 3.5 03/17/2021   CL 102 03/17/2021   CO2 24 03/17/2021   Lab Results  Component Value Date   ALT 11 03/17/2021  AST 14 03/17/2021   ALKPHOS 74 03/17/2021   BILITOT 0.4 03/17/2021   Lab Results  Component Value Date   MICROALBUR <0.7 03/17/2021   MICROALBUR 0.8 01/03/2019   Routine general medical examination at a health care facility We discussed the importance of regular physical activity and healthy diet for prevention of chronic illness and/or complications. Preventive  guidelines reviewed. Vaccination Not interested in vaccination today.  Next CPE in a year.  Type 2 diabetes mellitus without complication, with long-term current use of insulin (HCC) HgA1C at goal. Continue non pharmacologic treatment for now, pending HgA1C. Annual eye exam, periodic dental and foot care recommended. F/U in 5-6 months  Dyslipidemia We discussed CV benefits of statins. Recommendations according to FLP result.  Encounter for HCV screening test for low risk patient -     Hepatitis C antibody  Cervical cancer screening -     Cytology - PAP ([place])  Return in 6 months (on 09/15/2021) for DM II.  Stefan Markarian G. Martinique, MD  [facility]. [place] office.

## 2021-03-17 ENCOUNTER — Ambulatory Visit (INDEPENDENT_AMBULATORY_CARE_PROVIDER_SITE_OTHER): Payer: Self-pay | Admitting: Family Medicine

## 2021-03-17 ENCOUNTER — Encounter: Payer: Self-pay | Admitting: Family Medicine

## 2021-03-17 ENCOUNTER — Other Ambulatory Visit (HOSPITAL_COMMUNITY)
Admission: RE | Admit: 2021-03-17 | Discharge: 2021-03-17 | Disposition: A | Discharge status: Home / Self Care | Payer: Self-pay | Source: Ambulatory Visit | Attending: Family Medicine | Admitting: Family Medicine

## 2021-03-17 VITALS — BP 124/80 | HR 76 | Resp 16 | Ht 65.0 in | Wt 162.1 lb

## 2021-03-17 DIAGNOSIS — E785 Hyperlipidemia, unspecified: Secondary | ICD-10-CM

## 2021-03-17 DIAGNOSIS — Z124 Encounter for screening for malignant neoplasm of cervix: Secondary | ICD-10-CM | POA: Insufficient documentation

## 2021-03-17 DIAGNOSIS — E119 Type 2 diabetes mellitus without complications: Secondary | ICD-10-CM

## 2021-03-17 DIAGNOSIS — Z0000 Encounter for general adult medical examination without abnormal findings: Secondary | ICD-10-CM

## 2021-03-17 DIAGNOSIS — Z1159 Encounter for screening for other viral diseases: Secondary | ICD-10-CM

## 2021-03-17 DIAGNOSIS — Z794 Long term (current) use of insulin: Secondary | ICD-10-CM

## 2021-03-17 LAB — COMPREHENSIVE METABOLIC PANEL
ALT: 11 U/L (ref 0–35)
AST: 14 U/L (ref 0–37)
Albumin: 4 g/dL (ref 3.5–5.2)
Alkaline Phosphatase: 74 U/L (ref 39–117)
BUN: 13 mg/dL (ref 6–23)
CO2: 24 mEq/L (ref 19–32)
Calcium: 9.4 mg/dL (ref 8.4–10.5)
Chloride: 102 mEq/L (ref 96–112)
Creatinine, Ser: 0.83 mg/dL (ref 0.40–1.20)
GFR: 92.37 mL/min (ref >60.00)
Glucose, Bld: 320 mg/dL — ABNORMAL HIGH (ref 70–99)
Potassium: 3.5 mEq/L (ref 3.5–5.1)
Sodium: 135 mEq/L (ref 135–145)
Total Bilirubin: 0.4 mg/dL (ref 0.2–1.2)
Total Protein: 7.2 g/dL (ref 6.0–8.3)

## 2021-03-17 LAB — LIPID PANEL
Cholesterol: 205 mg/dL — ABNORMAL HIGH (ref 0–200)
HDL: 59.1 mg/dL (ref >39.00)
LDL Cholesterol: 124 mg/dL — ABNORMAL HIGH (ref 0–99)
NonHDL: 145.46
Total CHOL/HDL Ratio: 3
Triglycerides: 105 mg/dL (ref 0.0–149.0)
VLDL: 21 mg/dL (ref 0.0–40.0)

## 2021-03-17 LAB — HEMOGLOBIN A1C: Hgb A1c MFr Bld: 15.2 % — ABNORMAL HIGH (ref 4.6–6.5)

## 2021-03-17 LAB — MICROALBUMIN / CREATININE URINE RATIO
Creatinine,U: 62.3 mg/dL
Microalb Creat Ratio: 1.1 mg/g (ref 0.0–30.0)
Microalb, Ur: <0.7 mg/dL (ref 0.0–1.9)

## 2021-03-17 NOTE — Patient Instructions (Addendum)
A few things to remember from today's visit:  Routine general medical examination at a health care facility  Type 2 diabetes mellitus without complication, with long-term current use of insulin ([place])  Dyslipidemia  Do not use My Chart to request refills or for acute issues that need immediate attention.   Please be sure medication list is accurate. If a new problem present, please set up appointment sooner than planned today. Preventive Care 23-53 Years Old, Female Preventive care refers to lifestyle choices and visits with your health care provider that can promote health and wellness. Preventive care visits are also called wellness exams. What can I expect for my preventive care visit? Counseling During your preventive care visit, your health care provider may ask about your: Medical history, including: Past medical problems. Family medical history. Pregnancy history. Current health, including: Menstrual cycle. Method of birth control. Emotional well-being. Home life and relationship well-being. Sexual activity and sexual health. Lifestyle, including: Alcohol, nicotine or tobacco, and drug use. Access to firearms. Diet, exercise, and sleep habits. Work and work Statistician. Sunscreen use. Safety issues such as seatbelt and bike helmet use. Physical exam Your health care provider may check your: Height and weight. These may be used to calculate your BMI (body mass index). BMI is a measurement that tells if you are at a healthy weight. Waist circumference. This measures the distance around your waistline. This measurement also tells if you are at a healthy weight and may help predict your risk of certain diseases, such as type 2 diabetes and high blood pressure. Heart rate and blood pressure. Body temperature. Skin for abnormal spots. What immunizations do I need? Vaccines are usually given at various ages, according to a schedule. Your health care provider will recommend  vaccines for you based on your age, medical history, and lifestyle or other factors, such as travel or where you work. What tests do I need? Screening Your health care provider may recommend screening tests for certain conditions. This may include: Pelvic exam and Pap test. Lipid and cholesterol levels. Diabetes screening. This is done by checking your blood sugar (glucose) after you have not eaten for a while (fasting). Hepatitis B test. Hepatitis C test. HIV (human immunodeficiency virus) test. STI (sexually transmitted infection) testing, if you are at risk. BRCA-related cancer screening. This may be done if you have a family history of breast, ovarian, tubal, or peritoneal cancers. Talk with your health care provider about your test results, treatment options, and if necessary, the need for more tests. Follow these instructions at home: Eating and drinking  Eat a healthy diet that includes fresh fruits and vegetables, whole grains, lean protein, and low-fat dairy products. Take vitamin and mineral supplements as recommended by your health care provider. Do not drink alcohol if: Your health care provider tells you not to drink. You are pregnant, may be pregnant, or are planning to become pregnant. If you drink alcohol: Limit how much you have to 0-1 drink a day. Know how much alcohol is in your drink. In the [place] one drink equals one 12 oz bottle of beer (355 mL), one 5 oz glass of wine (148 mL), or one 1 oz glass of hard liquor (44 mL). Lifestyle Brush your teeth every morning and night with fluoride toothpaste. Floss one time each day. Exercise for at least 30 minutes 5 or more days each week. Do not use any products that contain nicotine or tobacco. These products include cigarettes, chewing tobacco, and vaping devices, such as  e-cigarettes. If you need help quitting, ask your health care provider. Do not use drugs. If you are sexually active, practice safe sex. Use a condom or  other form of protection to prevent STIs. If you do not wish to become pregnant, use a form of birth control. If you plan to become pregnant, see your health care provider for a prepregnancy visit. Find healthy ways to manage stress, such as: Meditation, yoga, or listening to music. Journaling. Talking to a trusted person. Spending time with friends and family. Minimize exposure to UV radiation to reduce your risk of skin cancer. Safety Always wear your seat belt while driving or riding in a vehicle. Do not drive: If you have been drinking alcohol. Do not ride with someone who has been drinking. If you have been using any mind-altering substances or drugs. While texting. When you are tired or distracted. Wear a helmet and other protective equipment during sports activities. If you have firearms in your house, make sure you follow all gun safety procedures. Seek help if you have been physically or sexually abused. What's next? Go to your health care provider once a year for an annual wellness visit. Ask your health care provider how often you should have your eyes and teeth checked. Stay up to date on all vaccines. This information is not intended to replace advice given to you by your health care provider. Make sure you discuss any questions you have with your health care provider. Document Revised: 09/17/2020 Document Reviewed: 09/17/2020 Elsevier Patient Education  [place].

## 2021-03-18 LAB — CYTOLOGY - PAP
Comment: NEGATIVE
Diagnosis: NEGATIVE
High risk HPV: NEGATIVE

## 2021-03-18 LAB — HEPATITIS C ANTIBODY
Hepatitis C Ab: NONREACTIVE
SIGNAL TO CUT-OFF: 0.03 (ref <1.00)

## 2021-03-19 ENCOUNTER — Other Ambulatory Visit: Payer: Self-pay | Admitting: Family Medicine

## 2021-03-20 MED ORDER — LEVONORGESTREL-ETHINYL ESTRAD 0.15-30 MG-MCG PO TABS
1.0000 | ORAL_TABLET | Freq: Every day | ORAL | 3 refills | Status: DC
Start: 2021-03-20 — End: 2022-01-04

## 2021-03-23 ENCOUNTER — Encounter: Payer: Self-pay | Admitting: Family Medicine

## 2021-04-16 ENCOUNTER — Encounter: Payer: Self-pay | Admitting: Family Medicine

## 2021-04-16 MED ORDER — GLUCOSE BLOOD VI STRP: ORAL_STRIP | 3 refills | Status: DC

## 2021-04-16 MED ORDER — ONETOUCH VERIO W/DEVICE KIT: PACK | 0 refills | Status: DC

## 2021-04-16 NOTE — Addendum Note (Signed)
Addended by: Kathreen Devoid on: 04/16/2021 02:34 PM   Modules accepted: Orders

## 2021-04-19 IMAGING — CT CT CHEST WITHOUT CONTRAST
2 of 4 series · 15 of 36 positions shown, 18 images · non-contrast
Comparison: Chest radiographs, 08/11/2018

CLINICAL DATA: Evaluate pneumomediastinum, vomiting

EXAM:
CT CHEST WITHOUT CONTRAST
TECHNIQUE: Multidetector CT imaging of the chest was performed following the
standard protocol without IV contrast.

[Series 2: thorax · axial · 0.57mm/px · z∈[+1376,+1586]mm · 12 of 125 slices shown, 15 images]
[im 10/125  mediastinal]
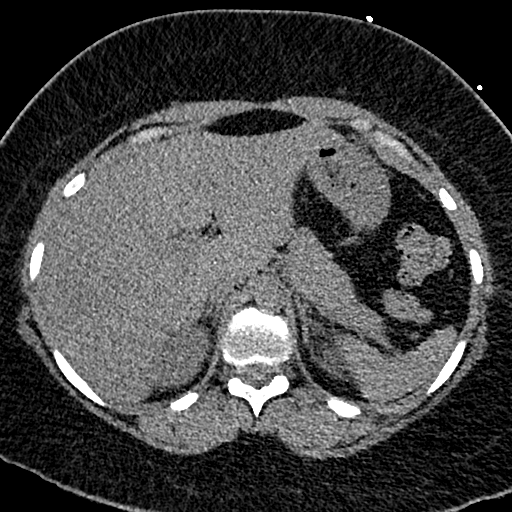
[im 10/125  lung]
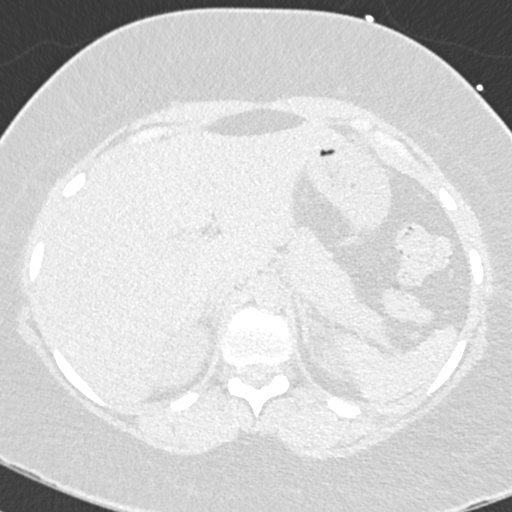
[im 20/125  lung]
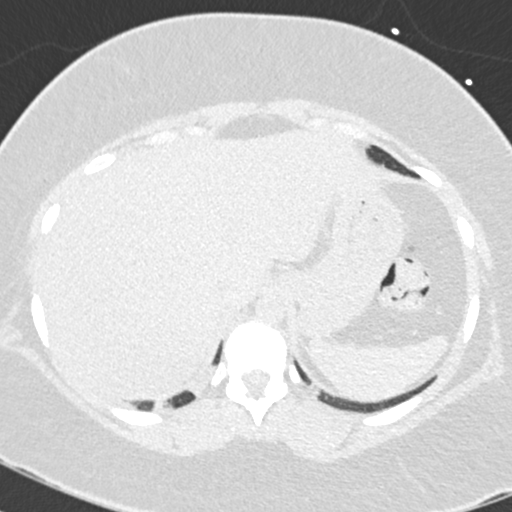
[im 29/125  lung]
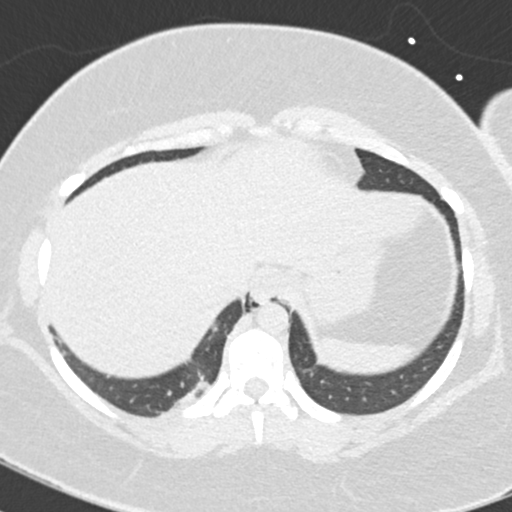
[im 39/125  lung]
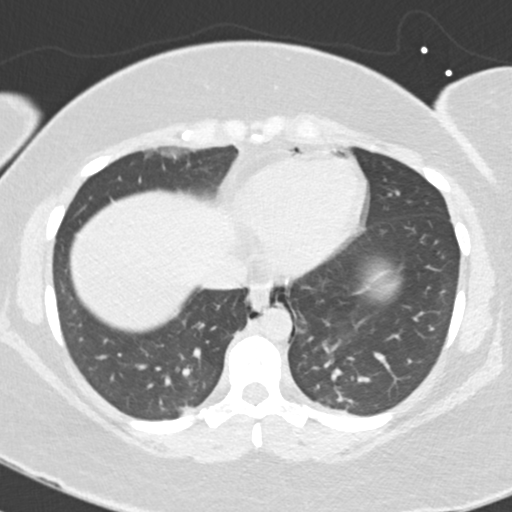
[im 48/125  mediastinal]
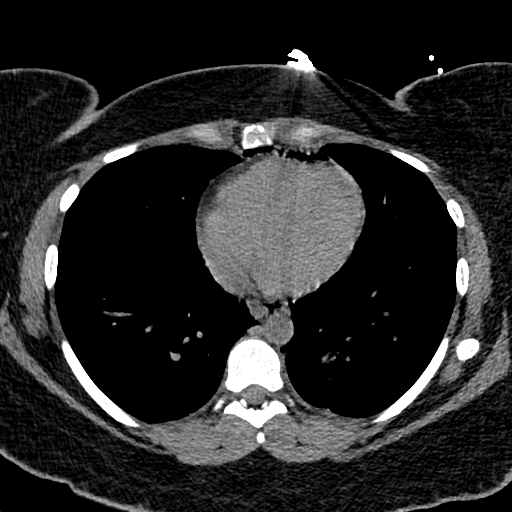
[im 48/125  lung]
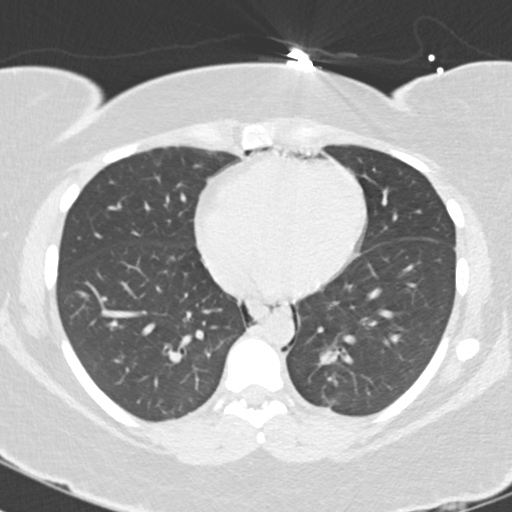
[im 58/125  lung]
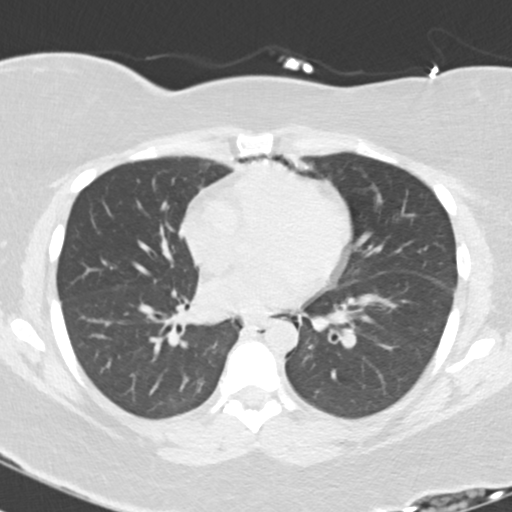
[im 67/125  lung]
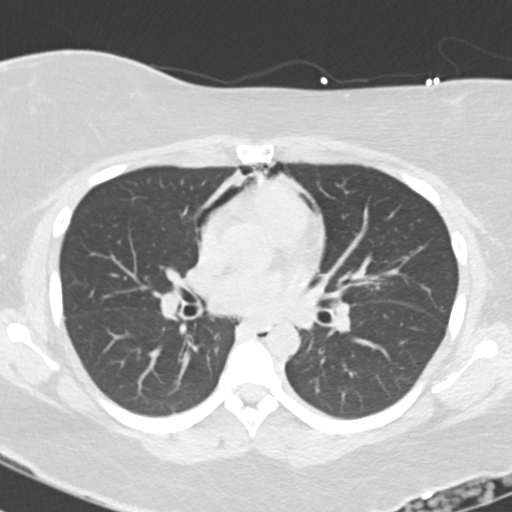
[im 77/125  lung]
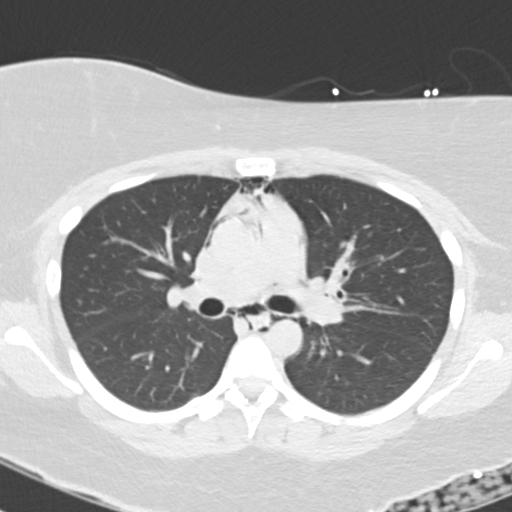
[im 86/125  mediastinal]
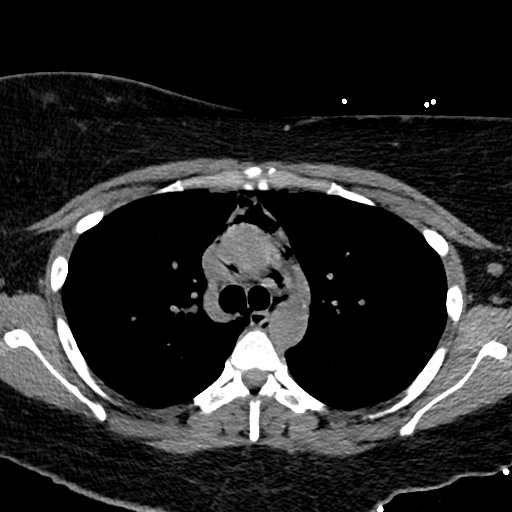
[im 86/125  lung]
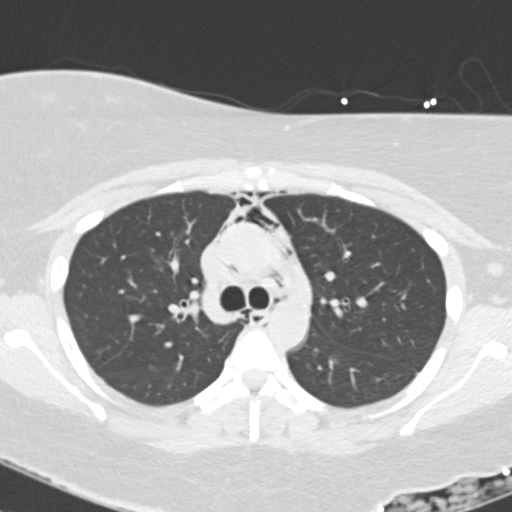
[im 96/125  lung]
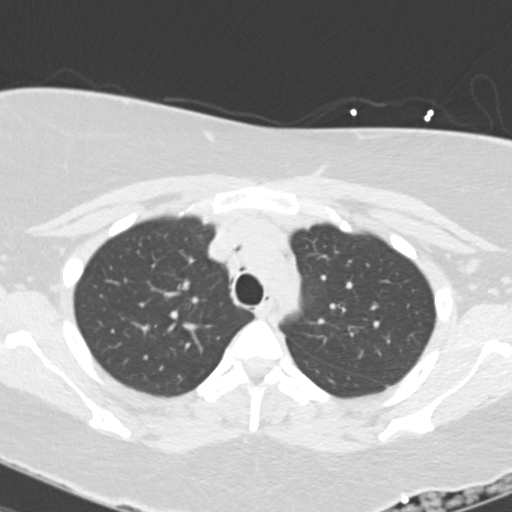
[im 105/125  lung]
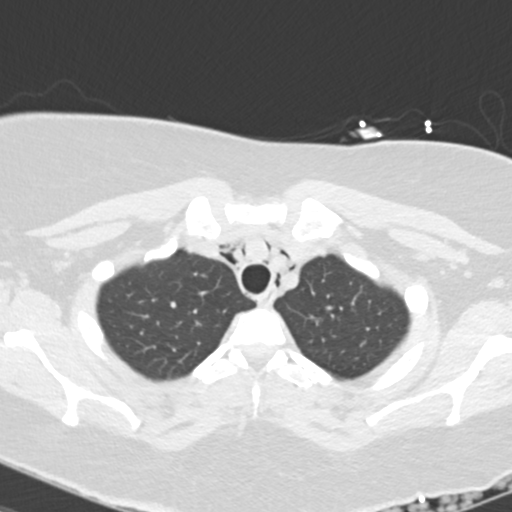
[im 115/125  lung]
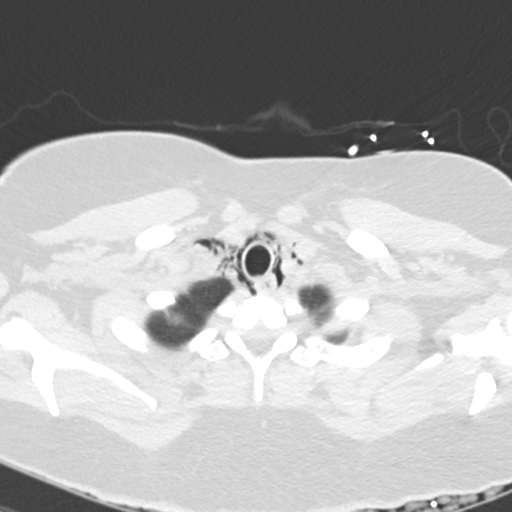

[Series 6: coronal · coronal · 0.59mm/px · 3 of 110 slices shown]
[im 22/110  lung]
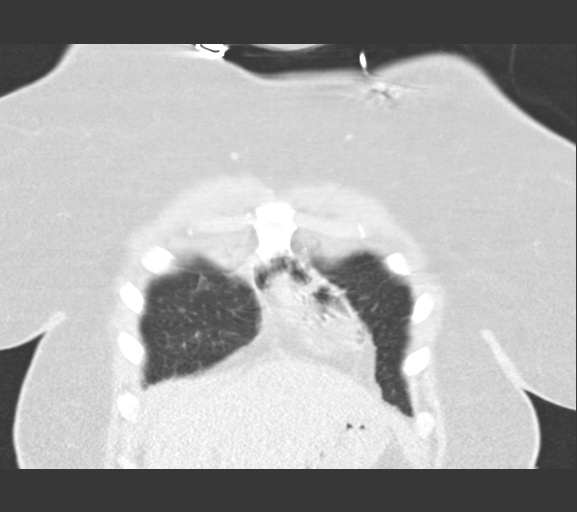
[im 44/110  lung]
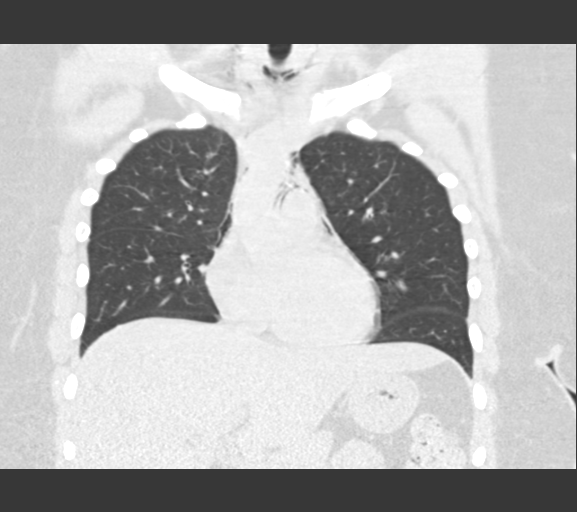
[im 66/110  lung]
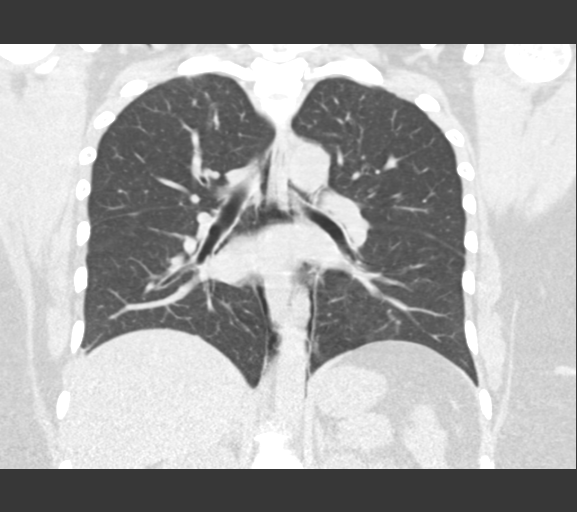

[15 of 36 positions shown; findings below may reference images not displayed]

FINDINGS: Cardiovascular: No significant vascular findings. Normal heart size.
No pericardial effusion.

Mediastinum/Nodes: Pneumomediastinum. No enlarged mediastinal,
hilar, or axillary lymph nodes. Thyroid gland, trachea, and
esophagus demonstrate no significant findings.

Lungs/Pleura: Mild bibasilar atelectasis. No pleural effusion or
pneumothorax.

Upper Abdomen: No acute abnormality.

Musculoskeletal: No chest wall mass or suspicious bone lesions
identified.
IMPRESSION: Pneumomediastinum, in keeping with findings of prior radiographs,
without obvious causative etiology.

## 2021-04-20 ENCOUNTER — Encounter: Payer: Self-pay | Admitting: Family Medicine

## 2021-04-20 ENCOUNTER — Telehealth (INDEPENDENT_AMBULATORY_CARE_PROVIDER_SITE_OTHER): Payer: Self-pay | Admitting: Family Medicine

## 2021-04-20 VITALS — Ht 65.0 in

## 2021-04-20 DIAGNOSIS — E785 Hyperlipidemia, unspecified: Secondary | ICD-10-CM

## 2021-04-20 DIAGNOSIS — E1169 Type 2 diabetes mellitus with other specified complication: Secondary | ICD-10-CM

## 2021-04-20 DIAGNOSIS — Z91018 Allergy to other foods: Secondary | ICD-10-CM

## 2021-04-20 NOTE — Assessment & Plan Note (Signed)
She would like to have "food allergy test" done, so immunology referral placed.

## 2021-04-20 NOTE — Assessment & Plan Note (Addendum)
Currently she is on nonpharmacologic treatment, she is not interested in medication for now. We discussed different pharmacologic options as well as side effects, I think Ozempic or Jardiance will be good options for her.  She has not tolerated metformin well in the past, GI side effects. We discussed possible complications of poorly controlled glucose. She would like A1c recheck in 06/2021 and to arrange follow-up in 6 months. Nutrition/diabetic education will be arranged.

## 2021-04-20 NOTE — Progress Notes (Signed)
Virtual Visit via Video Note I connected with Kathleen [name] on 04/20/21 by a video enabled telemedicine application and verified that I am speaking with the correct person using two identifiers.  Location patient: In her car. Location provider:work office Persons participating in the virtual visit: patient, provider  I discussed the limitations of evaluation and management by telemedicine and the availability of in person appointments. The patient expressed understanding and agreed to proceed.  Chief Complaint  Patient presents with   Results   HPI: Kathleen [name] is a 36 yo female with hx of DM II,HLD,and HTN being seen today to go through last lab results. DM II, she is on non pharmacologic treatment. Dx'ed 01/2017 with HgA1C 6.5. Last follow-up on12/13/22, I recommended to start pharmacologic treatment with either Jardiance or Ozempic + metformin. She did try metformin after hospitalization in 08/2018, had GI side effects. She acknowledges she has not been consistent with following dietary recommendations. Negative for abdominal pain, nausea,vomiting, polydipsia,polyuria, or polyphagia.  Lab Results  Component Value Date   HGBA1C 15.2 (H) 03/17/2021   Hyperlipidemia: Currently she is not on statin medication. She started eating flaxseed with oatmeal in the morning. She is exercising regularly.  Lab Results  Component Value Date   CHOL 205 (H) 03/17/2021   HDL 59.10 03/17/2021   LDLCALC 124 (H) 03/17/2021   TRIG 105.0 03/17/2021   CHOLHDL 3 03/17/2021   She is also requesting food allergy test, she would like to try new foods but afraid of allergic reactions. She is allergic to nuts and pears. She has seen immunologist when she was living in [place].  ROS: See pertinent positives and negatives per HPI.  Past Medical History:  Diagnosis Date   Allergy    Bronchitis    Diabetes mellitus without complication ([place])    Hypertension     History reviewed. No  pertinent surgical history.  Family History  Problem Relation Age of Onset   Diabetes Father    Diabetes Maternal Grandmother    Cancer Neg Hx    Hypertension Neg Hx    Stroke Neg Hx     Social History   Socioeconomic History   Marital status: Married    Spouse name: Not on file   Number of children: Not on file   Years of education: Not on file   Highest education level: Not on file  Occupational History   Not on file  Tobacco Use   Smoking status: Never   Smokeless tobacco: Never  Substance and Sexual Activity   Alcohol use: Yes    Alcohol/week: 1.0 standard drink    Types: 1 Standard drinks or equivalent per week   Drug use: No   Sexual activity: Yes    Partners: Male  Other Topics Concern   Not on file  Social History Narrative   Not on file   Social Determinants of Health   Financial Resource Strain: Not on file  Food Insecurity: Not on file  Transportation Needs: Not on file  Physical Activity: Not on file  Stress: Not on file  Social Connections: Not on file  Intimate Partner Violence: Not on file   Current Outpatient Medications:    Blood Glucose Monitoring Suppl (ONETOUCH VERIO) w/Device KIT, Use to check blood sugars up to 6 times per day., Disp: 1 kit, Rfl: 0   EPINEPHrine (EPIPEN) 0.3 mg/0.3 mL IJ SOAJ injection, Inject 0.3 mLs (0.3 mg total) into the muscle once as needed (for severe allergic reaction)., Disp:  0.3 mL, Rfl: 1   glucose blood test strip, Pt is checking BS up to 6 times per day., Disp: 600 each, Rfl: 3   Lancets (ONETOUCH DELICA PLUS [id number]) MISC, 1 Device by Does not apply route daily., Disp: 100 each, Rfl: 3   levonorgestrel-ethinyl estradiol (KURVELO) 0.15-30 MG-MCG tablet, Take 1 tablet by mouth daily., Disp: 84 tablet, Rfl: 3  EXAM:  VITALS per patient if applicable:Ht 5' 5" (3.845 m)    BMI 26.98 kg/m   GENERAL: alert, oriented, appears well and in no acute distress  HEENT: atraumatic, conjunctiva clear, no obvious  abnormalities on inspection.  NECK: normal movements of the head and neck  LUNGS: on inspection no signs of respiratory distress, breathing rate appears normal, no obvious gross SOB, gasping or wheezing  CV: no obvious cyanosis  MS: moves all visible extremities without noticeable abnormality  PSYCH/NEURO: pleasant and cooperative, no obvious depression or anxiety, speech and thought processing grossly intact  ASSESSMENT AND PLAN:  Discussed the following assessment and plan:  Type 2 diabetes mellitus with other specified complication, without long-term current use of insulin ([place]) - Plan: Amb Referral to Nutrition and Diabetic Education  Multiple food allergies - Plan: Ambulatory referral to Immunology  Hyperlipidemia, unspecified hyperlipidemia type  Multiple food allergies She would like to have "food allergy test" done, so immunology referral placed.   Hyperlipidemia, unspecified Currently on nonpharmacologic treatment. We discussed CV benefits of statin medication. She prefers to continue low-fat diet.  Diabetes mellitus ([place]) Currently she is on nonpharmacologic treatment, she is not interested in medication for now. We discussed different pharmacologic options as well as side effects, I think Ozempic or Jardiance will be good options for her.  She has not tolerated metformin well in the past, GI side effects. We discussed possible complications of poorly controlled glucose. She would like A1c recheck in 06/2021 and to arrange follow-up in 6 months. Nutrition/diabetic education will be arranged.  We discussed possible serious and likely etiologies, options for evaluation and workup, limitations of telemedicine visit vs in person visit, treatment, treatment risks and precautions.  I discussed the assessment and treatment plan with the patient. The patient was provided an opportunity to ask questions and all were answered. The patient agreed with the plan and demonstrated  an understanding of the instructions.  Return in about 26 weeks (around 10/19/2021) for Labs in 06/2021 (HgA1C and FLP)..  Betty G. Martinique, MD  [facility]. [place] office.

## 2021-04-20 NOTE — Assessment & Plan Note (Signed)
Currently on nonpharmacologic treatment. We discussed CV benefits of statin medication. She prefers to continue low-fat diet.

## 2021-06-22 ENCOUNTER — Ambulatory Visit: Payer: Self-pay | Admitting: Family Medicine

## 2021-12-21 ENCOUNTER — Encounter (HOSPITAL_COMMUNITY): Payer: Self-pay | Admitting: Emergency Medicine

## 2021-12-21 ENCOUNTER — Other Ambulatory Visit: Payer: Self-pay

## 2021-12-21 ENCOUNTER — Emergency Department (HOSPITAL_COMMUNITY)
Admission: EM | Admit: 2021-12-21 | Discharge: 2021-12-22 | Discharge status: Against Medical Advice | Payer: Self-pay | Attending: Emergency Medicine | Admitting: Emergency Medicine

## 2021-12-21 DIAGNOSIS — Z5321 Procedure and treatment not carried out due to patient leaving prior to being seen by health care provider: Secondary | ICD-10-CM | POA: Insufficient documentation

## 2021-12-21 DIAGNOSIS — H9203 Otalgia, bilateral: Secondary | ICD-10-CM | POA: Insufficient documentation

## 2021-12-21 NOTE — ED Triage Notes (Signed)
Pt reports that she got some water in her ear on Friday and it is now causing her pressure in her ear.

## 2021-12-21 NOTE — ED Provider Triage Note (Signed)
Emergency Medicine Provider Triage Evaluation Note  Kathleen Kathleen [name] , Kathleen [name] 36 y.o. female  was evaluated in triage.  Pt complains of fluid behind the ears.  States she was watching here the other day and got water in both of her ears.  She feels that she has Kathleen [name] swishing sensation when she tilts her head from side to side.  No pulsatile tinnitus  Review of Systems  Positive:  Negative:   Physical Exam  BP 120/87 (BP Location: Right Arm)   Pulse 84   Resp 16   SpO2 100%  Gen:   Awake, no distress   Resp:  Normal effort  MSK:   Moves extremities without difficulty  Other:    Medical Decision Making  Medically screening exam initiated at 7:14 PM.  Appropriate orders placed.  Kathleen Kathleen [name] was informed that the remainder of the evaluation will be completed by another provider, this initial triage assessment does not replace that evaluation, and the importance of remaining in the ED until their evaluation is complete.  Ear pain   Kathleen Kathleen [name] A, PA-C 12/21/21 1916

## 2021-12-22 ENCOUNTER — Ambulatory Visit (HOSPITAL_COMMUNITY)
Admission: EM | Admit: 2021-12-22 | Discharge: 2021-12-22 | Disposition: A | Discharge status: Home / Self Care | Payer: Self-pay | Attending: Family Medicine | Admitting: Family Medicine

## 2021-12-22 ENCOUNTER — Encounter (HOSPITAL_COMMUNITY): Payer: Self-pay

## 2021-12-22 DIAGNOSIS — H6983 Other specified disorders of Eustachian tube, bilateral: Secondary | ICD-10-CM

## 2021-12-22 NOTE — ED Notes (Signed)
Registration states this patient left 

## 2021-12-22 NOTE — ED Triage Notes (Signed)
Pt states got water in her ears after washing her hair on Thursday. States feels like fullness and pressure.

## 2021-12-22 NOTE — ED Provider Notes (Signed)
[facility]    CSN: [id number] Arrival date & time: 12/22/21  1856      History   Chief Complaint Chief Complaint  Patient presents with   Ear Fullness    HPI Kathleen [name] is a 36 y.o. female.   She washed her hair last week, and got water in her ears.  Since then she feels "fullness" in her ears, left worse than right.  No pain per se.  No runny nose, congestion, drainage.        Past Medical History:  Diagnosis Date   Allergy    Bronchitis    Diabetes mellitus without complication ([place])    Hypertension     Patient Active Problem List   Diagnosis Date Noted   HCAP (healthcare-associated pneumonia) 09/06/2018   Pulmonary abscess ([place]) 09/06/2018   Sepsis ([place]) 09/06/2018   Hypokalemia 09/06/2018   Hypoalbuminemia 09/06/2018   Hemoptysis 09/06/2018   Benign essential hypertension 08/30/2018   Reactive airway disease with wheezing 08/23/2018   GERD (gastroesophageal reflux disease) 08/16/2018   Diabetes mellitus ([place]) 08/11/2018   Hyperlipidemia, unspecified 01/04/2017   Contraception management 01/04/2017   Multiple food allergies 01/04/2017    History reviewed. No pertinent surgical history.  OB History   No obstetric history on file.      Home Medications    Prior to Admission medications   Medication Sig Start Date End Date Taking? Authorizing Provider  Blood Glucose Monitoring Suppl (ONETOUCH VERIO) w/Device KIT Use to check blood sugars up to 6 times per day. 04/16/21   Martinique, Betty G, MD  EPINEPHrine (EPIPEN) 0.3 mg/0.3 mL IJ SOAJ injection Inject 0.3 mLs (0.3 mg total) into the muscle once as needed (for severe allergic reaction). 10/31/18   [place], Betty G, MD  glucose blood test strip Pt is checking BS up to 6 times per day. 04/16/21   [place], Betty G, MD  Lancets [facility] DELICA PLUS [id number]) MISC 1 Device by Does not apply route daily. 09/29/18   [place], Betty G, MD  levonorgestrel-ethinyl estradiol Fortunato Curling) 0.15-30  MG-MCG tablet Take 1 tablet by mouth daily. 03/20/21   [place], Betty G, MD    Family History Family History  Problem Relation Age of Onset   Diabetes Father    Diabetes Maternal Grandmother    Cancer Neg Hx    Hypertension Neg Hx    Stroke Neg Hx     Social History Social History   Tobacco Use   Smoking status: Never   Smokeless tobacco: Never  Substance Use Topics   Alcohol use: Yes    Alcohol/week: 1.0 standard drink of alcohol    Types: 1 Standard drinks or equivalent per week   Drug use: No     Allergies   Amoxicillin, Apple juice, Corn-containing products, Other, Penicillins, Soy allergy, Fish allergy, Fruit & vegetable daily [nutritional supplements], Peanut-containing drug products, Shellfish allergy, and Wheat   Review of Systems Review of Systems  Constitutional: Negative.   HENT:  Negative for congestion, ear pain and sore throat.   Respiratory: Negative.    Cardiovascular: Negative.   Gastrointestinal: Negative.   Musculoskeletal: Negative.   Psychiatric/Behavioral: Negative.       Physical Exam Triage Vital Signs ED Triage Vitals [12/22/21 0905]  Enc Vitals Group     BP 120/86     Pulse Rate 88     Resp 18     Temp 98.6 F (37 C)     Temp Source Oral  SpO2 98 %     Weight      Height      Head Circumference      Peak Flow      Pain Score      Pain Loc      Pain Edu?      Excl. in [place]?    No data found.  Updated Vital Signs BP 120/86 (BP Location: Right Arm)   Pulse 88   Temp 98.6 F (37 C) (Oral)   Resp 18   LMP 11/25/2021   SpO2 98%   Visual Acuity Right Eye Distance:   Left Eye Distance:   Bilateral Distance:    Right Eye Near:   Left Eye Near:    Bilateral Near:     Physical Exam Constitutional:      Appearance: Normal appearance.  HENT:     Head: Normocephalic and atraumatic.     Right Ear: No tenderness. A middle ear effusion is present. Tympanic membrane is not erythematous.     Left Ear: No tenderness. A  middle ear effusion is present. Tympanic membrane is not erythematous.     Nose: Nose normal.  Cardiovascular:     Rate and Rhythm: Normal rate.  Pulmonary:     Effort: Pulmonary effort is normal.  Musculoskeletal:     Cervical back: Normal range of motion and neck supple. No tenderness.  Neurological:     Mental Status: She is alert.      UC Treatments / Results  Labs (all labs ordered are listed, but only abnormal results are displayed) Labs Reviewed - No data to display  EKG   Radiology No results found.  Procedures Procedures (including critical care time)  Medications Ordered in UC Medications - No data to display  Initial Impression / Assessment and Plan / UC Course  I have reviewed the triage vital signs and the nursing notes.  Pertinent labs & imaging results that were available during my care of the patient were reviewed by me and considered in my medical decision making (see chart for details).   Final Clinical Impressions(s) / UC Diagnoses   Final diagnoses:  Dysfunction of both eustachian tubes     Discharge Instructions      You were seen today for ear fullness, called eustachian tube dysfunction.  I recommend you continue with zyrtec daily.  I also recommend an over the counter decongestant like sudafed, and flonase nasal spray daily.  If you develop pain or fever, then return for further evaluation.     ED Prescriptions   None    PDMP not reviewed this encounter.   Rondel Oh, MD 12/22/21 [id number]

## 2021-12-22 NOTE — Discharge Instructions (Addendum)
You were seen today for ear fullness, called eustachian tube dysfunction.  I recommend you continue with zyrtec daily.  I also recommend an over the counter decongestant like sudafed, and flonase nasal spray daily.  If you develop pain or fever, then return for further evaluation.

## 2022-01-02 ENCOUNTER — Other Ambulatory Visit: Payer: Self-pay | Admitting: Family Medicine

## 2022-01-19 ENCOUNTER — Telehealth: Payer: Self-pay | Admitting: Family Medicine

## 2022-01-19 MED ORDER — LEVONORGESTREL-ETHINYL ESTRAD 0.15-30 MG-MCG PO TABS: 1.0000 | ORAL_TABLET | Freq: Every day | ORAL | 0 refills | Status: DC

## 2022-01-19 MED ORDER — ONETOUCH DELICA PLUS LANCET33G MISC: 1.0000 | Freq: Every day | 0 refills | Status: DC

## 2022-01-19 NOTE — Telephone Encounter (Signed)
Pt only received a partial fill of the:   KURVELO 0.15-30 MG-MCG tablet Lancets (ONETOUCH DELICA PLUS [id number]) MISC  *90 day supply needed.  LVV:  04/20/21  Preferred Pharmacies     [facility] [place], [place] Phone:  [phone]

## 2022-01-19 NOTE — Telephone Encounter (Signed)
Rx's sent in. °

## 2022-06-17 ENCOUNTER — Other Ambulatory Visit: Payer: Self-pay | Admitting: Family Medicine

## 2022-06-18 MED ORDER — LEVONORGESTREL-ETHINYL ESTRAD 0.15-30 MG-MCG PO TABS: 1.0000 | ORAL_TABLET | Freq: Every day | ORAL | 0 refills | Status: DC

## 2022-06-29 NOTE — Progress Notes (Signed)
ACUTE VISIT Chief Complaint  Patient presents with   Adenopathy    On left side, under ear.    HPI: Kathleen [name] is a 37 y.o. female, who is here today with her husband complaining of left ear fullness sensation, like she has water in ear and swollen lymph node on the left side, first noticed approximately one week ago. This visit follows a similar issue reported in December, for which she sought care in the emergency room and was advised to use Flonase nasal spray and cetirizine.  For the past two weeks, since washing her hair, she has been experiencing a sensation of pressure in her ears. She reports no changes in her hearing, earache,or drainage. Additionally, she denies experiencing a sore throat, rhinorrhea, headaches, fever, or chills.  Ear Fullness  There is pain in the left ear. The current episode started 1 to 4 weeks ago. The problem has been gradually improving. There has been no fever. Pertinent negatives include no abdominal pain, coughing, diarrhea, neck pain, rash or vomiting.   She has a known history of seasonal allergies, she has been utilizing a Flonase nasal spray 2 sprays in each nostril for one week. However, she notes that the use of the Flonase nasal spray has caused tenderness of her lymph nodes. Negative for abnormal wt loss or night sweats.  She was last seen on 04/20/21. DM II: Dx'ed 01/2017 with HgA1C 6.5 . She is not on pharmacologic treatment. BS's 150-200's. Negative for feet numbness/ulcers,polydipsia,polyuria, or polyphagia. She acknowledges not been consistent with exercising regularly and with following dietary recommendations.  Lab Results  Component Value Date   HGBA1C 15.2 (H) 03/17/2021   Lab Results  Component Value Date   CREATININE 0.83 03/17/2021   BUN 13 03/17/2021   NA 135 03/17/2021   K 3.5 03/17/2021   CL 102 03/17/2021   CO2 24 03/17/2021   Lab Results  Component Value Date   MICROALBUR <0.7 03/17/2021    MICROALBUR 0.8 01/03/2019   Review of Systems  Constitutional:  Negative for activity change and appetite change.  HENT:  Negative for mouth sores and sinus pressure.   Respiratory:  Negative for cough.   Gastrointestinal:  Negative for abdominal pain, diarrhea and vomiting.  Endocrine: Negative for cold intolerance and heat intolerance.  Genitourinary:  Negative for decreased urine volume, dysuria and hematuria.  Musculoskeletal:  Negative for neck pain.  Skin:  Negative for rash.  Neurological:  Negative for syncope and numbness.  See other pertinent positives and negatives in HPI.  Current Outpatient Medications on File Prior to Visit  Medication Sig Dispense Refill   Blood Glucose Monitoring Suppl (ONETOUCH VERIO) w/Device KIT Use to check blood sugars up to 6 times per day. 1 kit 0   EPINEPHrine (EPIPEN) 0.3 mg/0.3 mL IJ SOAJ injection Inject 0.3 mLs (0.3 mg total) into the muscle once as needed (for severe allergic reaction). 0.3 mL 1   glucose blood test strip Pt is checking BS up to 6 times per day. 600 each 3   Lancets (ONETOUCH DELICA PLUS [id number]) MISC 1 Device by Does not apply route daily. 100 each 0   levonorgestrel-ethinyl estradiol (KURVELO) 0.15-30 MG-MCG tablet Take 1 tablet by mouth daily. 84 tablet 0   No current facility-administered medications on file prior to visit.   Past Medical History:  Diagnosis Date   Allergy    Bronchitis    Diabetes mellitus without complication (HCC)    Hypertension    Allergies  Allergen Reactions   Amoxicillin Other (See Comments)    Pain in back   Apple Juice Anaphylaxis    Peaches, plums, pears, bananas, grapes, kiwi, all melons. CAN HAVE BLUEBERRIES AND STRAWBERRIES   Corn-Containing Products Anaphylaxis and Hives   Other Anaphylaxis   Penicillins Other (See Comments)    Did it involve swelling of the face/tongue/throat, SOB, or low BP? Yes Did it involve sudden or severe rash/hives, skin peeling, or any reaction on the  inside of your mouth or nose? Yes Did you need to seek medical attention at a hospital or doctor's office? Was already at hospital When did it last happen?  2010 If all above answers are "NO", may proceed with cephalosporin use.    Soy Allergy Anaphylaxis   Fish Allergy    Fruit & Vegetable Daily [Nutritional Supplements]     Allergy to fruits- Grapes, pears, peaches, apples, plums, bananas, kiwi, melons and mangoes.    Peanut-Containing Drug Products Hives    ALL NUTS!   Shellfish Allergy Hives, Itching and Swelling   Wheat Hives and Itching    Social History   Socioeconomic History   Marital status: Married    Spouse name: Not on file   Number of children: Not on file   Years of education: Not on file   Highest education level: GED or equivalent  Occupational History   Not on file  Tobacco Use   Smoking status: Never   Smokeless tobacco: Never  Substance and Sexual Activity   Alcohol use: Yes    Alcohol/week: 1.0 standard drink of alcohol    Types: 1 Standard drinks or equivalent per week   Drug use: No   Sexual activity: Yes    Partners: Male    Birth control/protection: Pill  Other Topics Concern   Not on file  Social History Narrative   Not on file   Social Determinants of Health   Financial Resource Strain: Low Risk  (06/30/2022)   Overall Financial Resource Strain (CARDIA)    Difficulty of Paying Living Expenses: Not hard at all  Food Insecurity: No Food Insecurity (06/30/2022)   Hunger Vital Sign    Worried About Running Out of Food in the Last Year: Never true    [place] in the Last Year: Never true  Transportation Needs: No Transportation Needs (06/30/2022)   PRAPARE - Hydrologist (Medical): No    Lack of Transportation (Non-Medical): No  Physical Activity: Sufficiently Active (06/30/2022)   Exercise Vital Sign    Days of Exercise per Week: 4 days    Minutes of Exercise per Session: 60 min  Stress: No Stress Concern  Present (06/30/2022)   [place]    Feeling of Stress : Not at all  Social Connections: Moderately Integrated (06/30/2022)   Social Connection and Isolation Panel [NHANES]    Frequency of Communication with Friends and Family: More than three times a week    Frequency of Social Gatherings with Friends and Family: Once a week    Attends Religious Services: More than 4 times per year    Active Member of [employer] or Organizations: No    Attends Archivist Meetings: Not on file    Marital Status: Married   Vitals:   06/30/22 1524  BP: 128/80  Pulse: 100  Resp: 12  Temp: 99 F (37.2 C)  SpO2: 98%   Body mass index is 24.84 kg/m.  Physical Exam Vitals and nursing note reviewed.  Constitutional:      General: She is not in acute distress.    Appearance: She is well-developed.  HENT:     Head: Normocephalic and atraumatic.     Left Ear: A middle ear effusion is present.     Mouth/Throat:     Mouth: Mucous membranes are moist.     Pharynx: Oropharynx is clear.  Eyes:     Conjunctiva/sclera: Conjunctivae normal.  Cardiovascular:     Rate and Rhythm: Normal rate and regular rhythm.     Pulses:          Dorsalis pedis pulses are 2+ on the right side and 2+ on the left side.     Heart sounds: No murmur heard. Pulmonary:     Effort: Pulmonary effort is normal. No respiratory distress.     Breath sounds: Normal breath sounds.  Abdominal:     Palpations: Abdomen is soft. There is no mass.     Tenderness: There is no abdominal tenderness.  Lymphadenopathy:     Cervical: Cervical adenopathy present.     Left cervical: No superficial (not enlarged, tender) cervical adenopathy.  Skin:    General: Skin is warm.     Findings: No erythema or rash.  Neurological:     General: No focal deficit present.     Mental Status: She is alert and oriented to person, place, and time.     Cranial Nerves: No cranial nerve  deficit.     Gait: Gait normal.  Psychiatric:        Mood and Affect: Mood and affect normal.   ASSESSMENT AND PLAN:  Ms. Jann was seen today for left ear fullness sensation, tender lymph node,and follow up. Orders Placed This Encounter  Procedures   CBC   Basic metabolic panel   Hemoglobin A1c   Microalbumin / creatinine urine ratio   Lab Results  Component Value Date   HGBA1C 14.6 (H) 06/30/2022   Lab Results  Component Value Date   MICROALBUR <0.7 06/30/2022   MICROALBUR <0.7 03/17/2021   Lab Results  Component Value Date   WBC 7.3 06/30/2022   HGB 14.1 06/30/2022   HCT 43.1 06/30/2022   MCV 86.3 06/30/2022   PLT 289.0 06/30/2022   Lab Results  Component Value Date   CREATININE 0.80 06/30/2022   BUN 10 06/30/2022   NA 133 (L) 06/30/2022   K 4.3 06/30/2022   CL 103 06/30/2022   CO2 22 06/30/2022    Dysfunction of left eustachian tube We discussed Dx,prognosis,and treatment options. OTC Sudafed 12 hours daily for 10 days may help, some sdie effects discussed. Auto inflation maneuvers. If persistent ENT evaluation can be arranged. Instructed about warning signs.  Hearing Screening   500Hz  1000Hz  2000Hz  4000Hz   Right ear Pass Pass Pass Pass  Left ear Pass Pass Pass Pass   Adenopathy, cervical Tender but not enlarged, improved after discontinuing Flonase nasal spray. Continue monitoring for changes. Instructed about warning signs.  Diabetes mellitus ([place]) Problem has not been well controlled. She has declined pharmacologic treatment in the past hoping to better controlled glucose with dietary changes. We discussed possible complications of elevated glucose. Continue appropriate foot care and periodic eye exams and dental visits. Further recommendations according to HgA1C result.  Return if symptoms worsen or fail to improve, for chronic problems.  Carlin Attridge G. Martinique, MD  [facility]. [place] office.

## 2022-06-30 ENCOUNTER — Ambulatory Visit (INDEPENDENT_AMBULATORY_CARE_PROVIDER_SITE_OTHER): Payer: Self-pay | Admitting: Family Medicine

## 2022-06-30 VITALS — BP 128/80 | HR 100 | Temp 99.0°F | Resp 12 | Ht 65.0 in | Wt 149.2 lb

## 2022-06-30 DIAGNOSIS — E1165 Type 2 diabetes mellitus with hyperglycemia: Secondary | ICD-10-CM

## 2022-06-30 DIAGNOSIS — R590 Localized enlarged lymph nodes: Secondary | ICD-10-CM

## 2022-06-30 DIAGNOSIS — H6992 Unspecified Eustachian tube disorder, left ear: Secondary | ICD-10-CM

## 2022-06-30 NOTE — Patient Instructions (Addendum)
A few things to remember from today's visit:  Dysfunction of left eustachian tube  Adenopathy, cervical - Plan: CBC  Type 2 diabetes mellitus with hyperglycemia, without long-term current use of insulin (HCC) - Plan: Basic metabolic panel, Hemoglobin A1c, Microalbumin / creatinine urine ratio  Popping your ear a few time per day may help. Monitor for neck masses, fever. If ear problem is not better we can arrange appt with ear nose specialist.  If you need refills for medications you take chronically, please call your pharmacy. Do not use My Chart to request refills or for acute issues that need immediate attention. If you send a my chart message, it may take a few days to be addressed, specially if I am not in the office.  Please be sure medication list is accurate. If a new problem present, please set up appointment sooner than planned today.

## 2022-07-01 LAB — BASIC METABOLIC PANEL
BUN: 10 mg/dL (ref 6–23)
CO2: 22 mEq/L (ref 19–32)
Calcium: 9 mg/dL (ref 8.4–10.5)
Chloride: 103 mEq/L (ref 96–112)
Creatinine, Ser: 0.8 mg/dL (ref 0.40–1.20)
GFR: 95.67 mL/min (ref >60.00)
Glucose, Bld: 312 mg/dL — ABNORMAL HIGH (ref 70–99)
Potassium: 4.3 mEq/L (ref 3.5–5.1)
Sodium: 133 mEq/L — ABNORMAL LOW (ref 135–145)

## 2022-07-01 LAB — HEMOGLOBIN A1C: Hgb A1c MFr Bld: 14.6 % — ABNORMAL HIGH (ref 4.6–6.5)

## 2022-07-01 LAB — CBC
HCT: 43.1 % (ref 36.0–46.0)
Hemoglobin: 14.1 g/dL (ref 12.0–15.0)
MCHC: 32.6 g/dL (ref 30.0–36.0)
MCV: 86.3 fl (ref 78.0–100.0)
Platelets: 289 10*3/uL (ref 150.0–400.0)
RBC: 5 Mil/uL (ref 3.87–5.11)
RDW: 12.3 % (ref 11.5–15.5)
WBC: 7.3 10*3/uL (ref 4.0–10.5)

## 2022-07-01 LAB — MICROALBUMIN / CREATININE URINE RATIO
Creatinine,U: 74.8 mg/dL
Microalb Creat Ratio: 0.9 mg/g (ref 0.0–30.0)
Microalb, Ur: <0.7 mg/dL (ref 0.0–1.9)

## 2022-07-02 NOTE — Assessment & Plan Note (Signed)
Problem has not been well controlled. She has declined pharmacologic treatment in the past hoping to better controlled glucose with dietary changes. We discussed possible complications of elevated glucose. Continue appropriate foot care and periodic eye exams and dental visits. Further recommendations according to HgA1C result.

## 2022-07-12 ENCOUNTER — Other Ambulatory Visit: Payer: Self-pay

## 2022-07-12 MED ORDER — OZEMPIC (0.25 OR 0.5 MG/DOSE) 2 MG/3ML ~~LOC~~ SOPN: PEN_INJECTOR | SUBCUTANEOUS | 1 refills | Status: DC

## 2022-07-12 MED ORDER — EMPAGLIFLOZIN 25 MG PO TABS: ORAL_TABLET | ORAL | 2 refills | Status: DC

## 2022-09-02 ENCOUNTER — Other Ambulatory Visit: Payer: Self-pay | Admitting: Family Medicine

## 2022-09-06 MED ORDER — LEVONORGESTREL-ETHINYL ESTRAD 0.15-30 MG-MCG PO TABS: 1.0000 | ORAL_TABLET | Freq: Every day | ORAL | 2 refills | Status: DC

## 2022-09-06 MED ORDER — ONETOUCH VERIO W/DEVICE KIT: PACK | 0 refills | Status: AC

## 2022-09-06 MED ORDER — GLUCOSE BLOOD VI STRP: ORAL_STRIP | 3 refills | Status: DC

## 2022-09-06 MED ORDER — ONETOUCH DELICA PLUS LANCET33G MISC: 1.0000 | Freq: Every day | 3 refills | Status: AC

## 2022-09-24 NOTE — Progress Notes (Deleted)
HPI: Kathleen [name] is a 37 y.o. female, who is here today for her routine physical.  Last CPE: 03/17/21  Regular exercise 3 or more time per week: *** Following a healthy diet: ***  Chronic medical problems: ***   There is no immunization history on file for this patient. Health Maintenance  Topic Date Due   COVID-19 Vaccine (1) Never done   OPHTHALMOLOGY EXAM  Never done   DTaP/Tdap/Td (1 - Tdap) Never done   INFLUENZA VACCINE  11/04/2022   HEMOGLOBIN A1C  12/31/2022   Diabetic kidney evaluation - eGFR measurement  06/30/2023   Diabetic kidney evaluation - Urine ACR  06/30/2023   FOOT EXAM  06/30/2023   PAP SMEAR-Modifier  03/17/2024   Hepatitis C Screening  Completed   HIV Screening  Completed   HPV VACCINES  Aged Out    She has *** concerns today.  Review of Systems  Current Outpatient Medications on File Prior to Visit  Medication Sig Dispense Refill   Blood Glucose Monitoring Suppl (ONETOUCH VERIO) w/Device KIT Use to check blood sugars up to 6 times per day. 1 kit 0   empagliflozin (JARDIANCE) 25 MG TABS tablet Take 1/2 tablet by mouth daily x 2 weeks and then increase to 1 tablet daily. 30 tablet 2   EPINEPHrine (EPIPEN) 0.3 mg/0.3 mL IJ SOAJ injection Inject 0.3 mLs (0.3 mg total) into the muscle once as needed (for severe allergic reaction). 0.3 mL 1   glucose blood test strip Pt is checking BS up to 6 times per day. 600 each 3   Lancets (ONETOUCH DELICA PLUS [id number]) MISC 1 Device by Does not apply route daily. 600 each 3   levonorgestrel-ethinyl estradiol (KURVELO) 0.15-30 MG-MCG tablet Take 1 tablet by mouth daily. 84 tablet 2   Semaglutide,0.25 or 0.5MG /DOS, (OZEMPIC, 0.25 OR 0.5 MG/DOSE,) 2 MG/3ML SOPN Inject 0.25 mg into the skin weekly x 4 weeks, then increase to 0.5 weekly x 4 weeks, and then increase to 1 mg(new rx will be sent once ready for 1 mg) 3 mL 1   No current facility-administered medications on file prior to visit.    Past  Medical History:  Diagnosis Date   Allergy    Bronchitis    Diabetes mellitus without complication (HCC)    Hypertension     No past surgical history on file.  Allergies  Allergen Reactions   Amoxicillin Other (See Comments)    Pain in back   Apple Juice Anaphylaxis    Peaches, plums, pears, bananas, grapes, kiwi, all melons. CAN HAVE BLUEBERRIES AND STRAWBERRIES   Corn-Containing Products Anaphylaxis and Hives   Other Anaphylaxis   Penicillins Other (See Comments)    Did it involve swelling of the face/tongue/throat, SOB, or low BP? Yes Did it involve sudden or severe rash/hives, skin peeling, or any reaction on the inside of your mouth or nose? Yes Did you need to seek medical attention at a hospital or doctor's office? Was already at hospital When did it last happen?  2010 If all above answers are "NO", may proceed with cephalosporin use.    Soy Allergy Anaphylaxis   Fish Allergy    Fruit & Vegetable Daily [Nutritional Supplements]     Allergy to fruits- Grapes, pears, peaches, apples, plums, bananas, kiwi, melons and mangoes.    Peanut-Containing Drug Products Hives    ALL NUTS!   Shellfish Allergy Hives, Itching and Swelling   Wheat Hives and Itching    Family History  Problem Relation Age of Onset   Diabetes Father    Diabetes Maternal Grandmother    Cancer Neg Hx    Hypertension Neg Hx    Stroke Neg Hx     Social History   Socioeconomic History   Marital status: Married    Spouse name: Not on file   Number of children: Not on file   Years of education: Not on file   Highest education level: GED or equivalent  Occupational History   Not on file  Tobacco Use   Smoking status: Never   Smokeless tobacco: Never  Substance and Sexual Activity   Alcohol use: Yes    Alcohol/week: 1.0 standard drink of alcohol    Types: 1 Standard drinks or equivalent per week   Drug use: No   Sexual activity: Yes    Partners: Male    Birth control/protection: Pill   Other Topics Concern   Not on file  Social History Narrative   Not on file   Social Determinants of Health   Financial Resource Strain: Low Risk  (06/30/2022)   Overall Financial Resource Strain (CARDIA)    Difficulty of Paying Living Expenses: Not hard at all  Food Insecurity: No Food Insecurity (06/30/2022)   Hunger Vital Sign    Worried About Running Out of Food in the Last Year: Never true    Ran Out of Food in the Last Year: Never true  Transportation Needs: No Transportation Needs (06/30/2022)   PRAPARE - Administrator, Civil Service (Medical): No    Lack of Transportation (Non-Medical): No  Physical Activity: Sufficiently Active (06/30/2022)   Exercise Vital Sign    Days of Exercise per Week: 4 days    Minutes of Exercise per Session: 60 min  Stress: No Stress Concern Present (06/30/2022)   [employer] of Occupational Health - Occupational Stress Questionnaire    Feeling of Stress : Not at all  Social Connections: Moderately Integrated (06/30/2022)   Social Connection and Isolation Panel [NHANES]    Frequency of Communication with Friends and Family: More than three times a week    Frequency of Social Gatherings with Friends and Family: Once a week    Attends Religious Services: More than 4 times per year    Active Member of [employer] or Organizations: No    Attends Engineer, structural: Not on file    Marital Status: Married    There were no vitals filed for this visit. There is no height or weight on file to calculate BMI.  Wt Readings from Last 3 Encounters:  06/30/22 149 lb 4 oz (67.7 kg)  03/17/21 162 lb 2 oz (73.5 kg)  10/17/19 159 lb (72.1 kg)    Physical Exam  ASSESSMENT AND PLAN: Kathleen [name] was here today annual physical examination.  No orders of the defined types were placed in this encounter.   There are no diagnoses linked to this encounter.  There are no diagnoses linked to this encounter.  No follow-ups  on file.  Betty G. [place], MD  [facility]. [place] office.

## 2022-09-27 ENCOUNTER — Encounter: Payer: Self-pay | Admitting: Family Medicine

## 2022-11-15 ENCOUNTER — Encounter: Payer: Self-pay | Admitting: Family Medicine

## 2023-04-12 ENCOUNTER — Telehealth: Payer: Self-pay

## 2023-04-12 MED ORDER — LEVONORGESTREL-ETHINYL ESTRAD 0.15-30 MG-MCG PO TABS: 1.0000 | ORAL_TABLET | Freq: Every day | ORAL | 0 refills | Status: DC

## 2023-04-12 NOTE — Telephone Encounter (Signed)
 Copied from CRM [id number]. Topic: Clinical - Medication Refill >> Apr 12, 2023  2:07 PM Gerardine PARAS wrote: Most Recent Primary Care Visit:  Provider: JORDAN, BETTY G  Department: [facility]  Visit Type: OFFICE VISIT  Date: 06/30/2022  Medication: levonorgestrel -ethinyl estradiol  (KURVELO ) 0.15-30 MG-MCG tablet   Has the patient contacted their pharmacy? No, patient states box said 0 refills (Agent: If no, request that the patient contact the pharmacy for the refill. If patient does not wish to contact the pharmacy document the reason why and proceed with request.) (Agent: If yes, when and what did the pharmacy advise?)  Is this the correct pharmacy for this prescription? Yes If no, delete pharmacy and type the correct one.  This is the patient's preferred pharmacy:  [id number] - [place], [place] - [address] AT [facility] OF [address] [place] [address] Phone: [phone] Fax: [phone]   Has the prescription been filled recently? Yes, December  Is the patient out of the medication? No, 3 month supply on hand wants prescription to already be at pharmacy  Has the patient been seen for an appointment in the last year OR does the patient have an upcoming appointment? Yes  Can we respond through MyChart? Yes  Agent: Please be advised that Rx refills may take up to 3 business days. We ask that you follow-up with your pharmacy.

## 2023-08-30 ENCOUNTER — Telehealth: Payer: Self-pay | Admitting: Family Medicine

## 2023-08-30 NOTE — Telephone Encounter (Unsigned)
 Copied from CRM [id number]. Topic: Clinical - Medication Refill >> Aug 30, 2023 11:10 AM Kathleen [name] wrote: Medication: levonorgestrel -ethinyl estradiol  (KURVELO ) 0.15-30 MG-MCG tablet  Has the patient contacted their pharmacy? Yes (Agent: If no, request that the patient contact the pharmacy for the refill. If patient does not wish to contact the pharmacy document the reason why and proceed with request.) (Agent: If yes, when and what did the pharmacy advise?)  This is the patient's preferred pharmacy:  [id number] - [place], [place] - [address] AT [facility] OF [address] [place] [address] Phone: [phone] Fax: [phone]  Is this the correct pharmacy for this prescription? Yes If no, delete pharmacy and type the correct one.   Has the prescription been filled recently? No  Is the patient out of the medication? Yes- she has 5 days  Has the patient been seen for an appointment in the last year OR does the patient have an upcoming appointment? Yes  Can we respond through MyChart? Yes  Agent: Please be advised that Rx refills may take up to 3 business days. We ask that you follow-up with your pharmacy.

## 2023-08-31 MED ORDER — LEVONORGESTREL-ETHINYL ESTRAD 0.15-30 MG-MCG PO TABS: 1.0000 | ORAL_TABLET | Freq: Every day | ORAL | 0 refills | Status: DC

## 2023-10-01 ENCOUNTER — Other Ambulatory Visit: Payer: Self-pay | Admitting: Family Medicine

## 2023-10-03 ENCOUNTER — Other Ambulatory Visit: Payer: Self-pay | Admitting: Family Medicine

## 2023-10-03 MED ORDER — LEVONORGESTREL-ETHINYL ESTRAD 0.15-30 MG-MCG PO TABS: 1.0000 | ORAL_TABLET | Freq: Every day | ORAL | 0 refills | Status: DC

## 2023-10-03 NOTE — Telephone Encounter (Signed)
 Copied from CRM [id number]. Topic: Clinical - Medication Refill >> Oct 03, 2023  9:15 AM Jalayah J wrote: Medication: levonorgestrel -ethinyl estradiol  (KURVELO ) 0.15-30 MG-MCG tablet  Has the patient contacted their pharmacy? Yes (Agent: If no, request that the patient contact the pharmacy for the refill. If patient does not wish to contact the pharmacy document the reason why and proceed with request.) (Agent: If yes, when and what did the pharmacy advise?)  This is the patient's preferred pharmacy:  [id number] - [place], [place] - [address] AT [facility] OF [address] [place] [address] Phone: [phone] Fax: [phone]  Is this the correct pharmacy for this prescription? Yes If no, delete pharmacy and type the correct one.   Has the prescription been filled recently? Yes  Is the patient out of the medication? Yes  Has the patient been seen for an appointment in the last year OR does the patient have an upcoming appointment? Yes  Can we respond through MyChart? Yes  Agent: Please be advised that Rx refills may take up to 3 business days. We ask that you follow-up with your pharmacy.

## 2023-10-11 ENCOUNTER — Ambulatory Visit: Payer: Self-pay | Admitting: Family Medicine

## 2023-11-05 ENCOUNTER — Other Ambulatory Visit: Payer: Self-pay | Admitting: Family Medicine

## 2024-01-23 ENCOUNTER — Other Ambulatory Visit: Payer: Self-pay | Admitting: Family Medicine

## 2024-01-23 NOTE — Telephone Encounter (Unsigned)
 Copied from CRM #[id number]. Topic: Clinical - Medication Refill >> Jan 23, 2024  5:30 PM Shereese L wrote: Medication: Levonorgestrel -Ethinyl Estrad  Has the patient contacted their pharmacy? Yes (Agent: If no, request that the patient contact the pharmacy for the refill. If patient does not wish to contact the pharmacy document the reason why and proceed with request.) (Agent: If yes, when and what did the pharmacy advise?)  This is the patient's preferred pharmacy:  [id number] - [place], [place] - [address] AT [facility] OF [address] [place] [address] Phone: [phone] Fax: [phone]  Is this the correct pharmacy for this prescription? Yes If no, delete pharmacy and type the correct one.   Has the prescription been filled recently? Yes  Is the patient out of the medication? Yes  Has the patient been seen for an appointment in the last year OR does the patient have an upcoming appointment? Yes  Can we respond through MyChart? Yes  Agent: Please be advised that Rx refills may take up to 3 business days. We ask that you follow-up with your pharmacy.

## 2024-01-24 ENCOUNTER — Other Ambulatory Visit: Payer: Self-pay | Admitting: Family Medicine

## 2024-01-24 NOTE — Telephone Encounter (Signed)
 Copied from CRM [id number]. Topic: Clinical - Medication Refill >> Jan 24, 2024  4:56 PM Jasmin G wrote: Medication: levonorgestrel -ethinyl estradiol  (KURVELO ) 0.15-30 MG-MCG tablet. Pt requested for a 90 day refill.  Has the patient contacted their pharmacy? No (Agent: If no, request that the patient contact the pharmacy for the refill. If patient does not wish to contact the pharmacy document the reason why and proceed with request.) (Agent: If yes, when and what did the pharmacy advise?)  This is the patient's preferred pharmacy:  [id number] - [place], [place] - [address] AT [facility] [address] [place] [address] Phone: [phone] Fax: [phone]  Is this the correct pharmacy for this prescription? Yes If no, delete pharmacy and type the correct one.   Has the prescription been filled recently? Yes  Is the patient out of the medication? No  Has the patient been seen for an appointment in the last year OR does the patient have an upcoming appointment? Yes  Can we respond through MyChart? No  Agent: Please be advised that Rx refills may take up to 3 business days. We ask that you follow-up with your pharmacy.

## 2024-01-24 NOTE — Telephone Encounter (Signed)
 Pt called in to check the status of refill request stating that she called [facility] and they said they still don't see it. I did inform her it could take up to 3 business days and to check back with [facility] tomorrow.

## 2024-01-25 ENCOUNTER — Telehealth: Payer: Self-pay

## 2024-01-25 ENCOUNTER — Ambulatory Visit: Payer: Self-pay | Admitting: Family Medicine

## 2024-01-25 VITALS — BP 110/70 | HR 100 | Temp 97.9°F | Resp 17 | Ht 65.0 in | Wt 131.4 lb

## 2024-01-25 DIAGNOSIS — Z5320 Procedure and treatment not carried out because of patient's decision for unspecified reasons: Secondary | ICD-10-CM | POA: Insufficient documentation

## 2024-01-25 DIAGNOSIS — E1165 Type 2 diabetes mellitus with hyperglycemia: Secondary | ICD-10-CM

## 2024-01-25 DIAGNOSIS — Z3041 Encounter for surveillance of contraceptive pills: Secondary | ICD-10-CM

## 2024-01-25 LAB — POCT GLYCOSYLATED HEMOGLOBIN (HGB A1C): Hemoglobin A1C: 12.5 % — AB (ref 4.0–5.6)

## 2024-01-25 MED ORDER — LEVONORGESTREL-ETHINYL ESTRAD 0.15-30 MG-MCG PO TABS: 1.0000 | ORAL_TABLET | Freq: Every day | ORAL | 3 refills | Status: AC

## 2024-01-25 MED ORDER — ONETOUCH VERIO VI STRP: ORAL_STRIP | 1 refills | Status: DC

## 2024-01-25 NOTE — Assessment & Plan Note (Signed)
 She has declined treatment for diabetes. She has declined statin treatment for hyperlipidemia, when recommended after lipid panel in 03/2021.

## 2024-01-25 NOTE — Assessment & Plan Note (Signed)
 He has been tolerated levonorgestrel -18 mL estradiol  0.15-30 mg tablet well. Side effects of OCP discussed. Continue regular follow-up.

## 2024-01-25 NOTE — Telephone Encounter (Signed)
 Patient has scheduled appointment on 01/25/24

## 2024-01-25 NOTE — Assessment & Plan Note (Addendum)
 Problem is not well-controlled. We discussed possible complications of persistently elevated glucose levels. She does not want any pharmacologic treatment. Initially expressed not interested in having an eye exam but at the end of her visit, she states that she will arrange appt. Lab is closed today, she does not have health insurance at this time but agrees with having BMP and microalb/cr ratio tomorrow, lab is closed at this time. Annual eye exam, periodic dental and foot care recommended. F/U in 4 months.

## 2024-01-25 NOTE — Telephone Encounter (Signed)
 Has an appt today. Last seen 06/2022. BJ

## 2024-01-25 NOTE — Telephone Encounter (Signed)
 Patient states she has appt in December. Per patient she cannot come before. She is asking for refill on Kurvelo . Please advise if ok to refill.    opied from CRM #[id number]. Topic: Clinical - Prescription Issue >> Jan 24, 2024  4:49 PM Jasmin G wrote: Reason for CRM: Pt called regarding recent missed phone call from Ms. Willo Kirke DEL, CMA, I relayed info and pt stated that she had an appt scheduled on Dec 8th, after clarifying with CAL and informing pt that she needs a sooner appt for refill, pt requested for me inform Dr. Gib team that she cannot schedule a sooner appt than Dec 8th and needs her prescription refill. Please call pt back at [phone] to discuss.

## 2024-01-25 NOTE — Progress Notes (Signed)
 Chief Complaint  Patient presents with   Medical Management of Chronic Issues   Discussed the use of AI scribe software for clinical note transcription with the patient, who gave verbal consent to proceed.  History of Present Illness Kathleen [name] is a 38 year old female with past medical history significant for DM 2, hypertension, hyperlipidemia, GERD, and multiple food allergies who presents for follow-up.  She is especially interested in refills for OCP.  She was last seen on 06/30/2022. No new problems since her last visit.  -She has been using Kurvelo  for birth control and would like to continue, she mentions that she is not ready to conceive at this time.  She has tolerated medication well. Negative for pelvic pain, vaginal discharge, or abnormal bleeding. Her menstrual periods are regular, occurring monthly, with the last one starting on September 25th. She notes that her periods have become less painful and lighter since being on OCP and after life style changes.  -DM 2 diagnosed in 01/2017 with a hemoglobin A1c of 6.5. Hospitalized in 08/2018 due to ketoacidosis. Metformin  caused GI side effects in the past.  Jardiance  and Ozempic  were recommended during her last follow-up, she did not start medications.  States that her blood sugars have been consistent, with the highest readings in the 200s and the lowest around 180-190 when she exercises.  States that she has maintained her weight between 130-132 pounds, down from 230 pounds through diet and exercise.   Her hemoglobin A1c was 14.6 in March 2024.   She follows a 'healthy lifestyle diet,' focusing on vegetables and proteins with low to no carbohydrates. She occasionally indulges in sweets, such as a Hershey bar once a month and a slice of cake every three months. She eats three meals a day, mostly protein-based, and avoids foods that spike her blood sugar, like rice.  No numbness, tingling, or burning sensations  in her feet, but she notes that she has had a cold great toes sensation, which she attributes to nail polish and planning on having it removed. Negative for symptoms of hypoglycemia, polyuria, polydipsia, or foot ulcers/trauma.  She has not had an eye exam and adds that she is the only one in her family with 20/20 vision, so does not consider necessary to have an eye examination.  Lab Results  Component Value Date   CREATININE 0.80 06/30/2022   BUN 10 06/30/2022   NA 133 (L) 06/30/2022   K 4.3 06/30/2022   CL 103 06/30/2022   CO2 22 06/30/2022   HLD: She has declined statin medication in the past. Lab Results  Component Value Date   CHOL 205 (H) 03/17/2021   HDL 59.10 03/17/2021   LDLCALC 124 (H) 03/17/2021   TRIG 105.0 03/17/2021   CHOLHDL 3 03/17/2021   Review of Systems  Constitutional:  Negative for activity change, appetite change, chills and fever.  HENT:  Negative for sore throat.   Respiratory:  Negative for cough and shortness of breath.   Cardiovascular:  Negative for chest pain, palpitations and leg swelling.  Gastrointestinal:  Negative for abdominal pain, nausea and vomiting.  Genitourinary:  Negative for decreased urine volume, dysuria and hematuria.  Skin:  Negative for rash.  Neurological:  Negative for syncope, weakness and headaches.  See other pertinent positives and negatives in HPI.  Current Outpatient Medications on File Prior to Visit  Medication Sig Dispense Refill   Blood Glucose Monitoring Suppl (ONETOUCH VERIO) w/Device KIT Use to check blood sugars  up to 6 times per day. 1 kit 0   EPINEPHrine  (EPIPEN ) 0.3 mg/0.3 mL IJ SOAJ injection Inject 0.3 mLs (0.3 mg total) into the muscle once as needed (for severe allergic reaction). 0.3 mL 1   Lancets (ONETOUCH DELICA PLUS LANCET33G) MISC 1 Device by Does not apply route daily. 600 each 3   No current facility-administered medications on file prior to visit.    Past Medical History:  Diagnosis Date    Allergy    Bronchitis    Diabetes mellitus without complication (HCC)    Hypertension     Allergies  Allergen Reactions   Amoxicillin Other (See Comments)    Pain in back   Apple Juice Anaphylaxis    Peaches, plums, pears, bananas, grapes, kiwi, all melons. CAN HAVE BLUEBERRIES AND STRAWBERRIES   Corn-Containing Products Anaphylaxis and Hives   Other Anaphylaxis   Penicillins Other (See Comments)    Did it involve swelling of the face/tongue/throat, SOB, or low BP? Yes Did it involve sudden or severe rash/hives, skin peeling, or any reaction on the inside of your mouth or nose? Yes Did you need to seek medical attention at a hospital or doctor's office? Was already at hospital When did it last happen?  2010 If all above answers are "NO", may proceed with cephalosporin use.    Soy Allergy (Obsolete) Anaphylaxis   Fish Allergy    Fruit & Vegetable Daily [Nutritional Supplements]     Allergy to fruits- Grapes, pears, peaches, apples, plums, bananas, kiwi, melons and mangoes.    Peanut-Containing Drug Products Hives    ALL NUTS!   Shellfish Allergy Hives, Itching and Swelling   Wheat Hives and Itching    Social History   Socioeconomic History   Marital status: Married    Spouse name: Not on file   Number of children: Not on file   Years of education: Not on file   Highest education level: GED or equivalent  Occupational History   Not on file  Tobacco Use   Smoking status: Never   Smokeless tobacco: Never  Substance and Sexual Activity   Alcohol use: Yes    Alcohol/week: 1.0 standard drink of alcohol    Types: 1 Standard drinks or equivalent per week   Drug use: No   Sexual activity: Yes    Partners: Male    Birth control/protection: Pill  Other Topics Concern   Not on file  Social History Narrative   Not on file   Social Drivers of Health   Financial Resource Strain: Low Risk  (01/25/2024)   Overall Financial Resource Strain (CARDIA)    Difficulty of Paying  Living Expenses: Not hard at all  Food Insecurity: No Food Insecurity (01/25/2024)   Hunger Vital Sign    Worried About Running Out of Food in the Last Year: Never true    Ran Out of Food in the Last Year: Never true  Transportation Needs: Unmet Transportation Needs (01/25/2024)   PRAPARE - Administrator, Civil Service (Medical): Yes    Lack of Transportation (Non-Medical): No  Physical Activity: Sufficiently Active (01/25/2024)   Exercise Vital Sign    Days of Exercise per Week: 7 days    Minutes of Exercise per Session: 60 min  Stress: No Stress Concern Present (01/25/2024)   [employer] of Occupational Health - Occupational Stress Questionnaire    Feeling of Stress: Not at all  Social Connections: Moderately Isolated (01/25/2024)   Social Connection and Isolation Panel  Frequency of Communication with Friends and Family: Once a week    Frequency of Social Gatherings with Friends and Family: Once a week    Attends Religious Services: 1 to 4 times per year    Active Member of [employer] or Organizations: No    Attends Engineer, structural: Not on file    Marital Status: Married   Today's Vitals   01/25/24 1607  BP: 110/70  Pulse: 100  Resp: 17  Temp: 97.9 F (36.6 C)  SpO2: 98%  Weight: 131 lb 6.4 oz (59.6 kg)  Height: 5' 5 (1.651 m)   Body mass index is 21.87 kg/m.  Physical Exam Vitals and nursing note reviewed.  Constitutional:      General: She is not in acute distress.    Appearance: She is well-developed.  HENT:     Head: Normocephalic and atraumatic.     Mouth/Throat:     Mouth: Mucous membranes are moist.  Eyes:     Conjunctiva/sclera: Conjunctivae normal.  Cardiovascular:     Rate and Rhythm: Normal rate and regular rhythm.     Pulses:          Dorsalis pedis pulses are 2+ on the right side and 2+ on the left side.     Heart sounds: No murmur heard. Pulmonary:     Effort: Pulmonary effort is normal. No respiratory distress.      Breath sounds: Normal breath sounds.  Abdominal:     Palpations: Abdomen is soft. There is no mass.     Tenderness: There is no abdominal tenderness.  Skin:    General: Skin is warm.     Findings: No erythema or rash.  Neurological:     General: No focal deficit present.     Mental Status: She is alert and oriented to person, place, and time.     Cranial Nerves: No cranial nerve deficit.     Gait: Gait normal.  Psychiatric:        Mood and Affect: Mood and affect normal.   ASSESSMENT AND PLAN:  Kathleen [name] was seen today for medical management of chronic issues.  Diagnoses and all orders for this visit:  Orders Placed This Encounter  Procedures   Microalbumin / creatinine urine ratio   Basic metabolic panel with GFR   POC HgB A1c   Lab Results  Component Value Date   HGBA1C 12.5 (A) 01/25/2024   Type 2 diabetes mellitus with hyperglycemia, without long-term current use of insulin  (HCC) Assessment & Plan: Problem is not well-controlled. We discussed possible complications of persistently elevated glucose levels. She does not want any pharmacologic treatment. Initially expressed not interested in having an eye exam but at the end of her visit, she states that she will arrange appt. Lab is closed today, she does not have health insurance at this time but agrees with having BMP and microalb/cr ratio tomorrow, lab is closed at this time. Annual eye exam, periodic dental and foot care recommended. F/U in 4 months.  Orders: -     POCT glycosylated hemoglobin (Hb A1C) -     Microalbumin / creatinine urine ratio; Future -     Basic metabolic panel with GFR; Future -     OneTouch Verio; Use as instructed  Dispense: 600 strip; Refill: 1  Encounter for surveillance of contraceptive pills Assessment & Plan: He has been tolerated levonorgestrel -18 mL estradiol  0.15-30 mg tablet well. Side effects of OCP discussed. Continue regular follow-up.  Orders: -  Levonorgestrel -Ethinyl Estrad; Take 1 tablet by mouth daily.  Dispense: 84 tablet; Refill: 3  Treatment declined by patient Assessment & Plan: She has declined treatment for diabetes. She has declined statin treatment for hyperlipidemia, when recommended after lipid panel in 03/2021.   Return in about 4 months (around 05/27/2024) for chronic problems.   Vallerie Hentz [place], MD [facility]. [facility] office.

## 2024-01-25 NOTE — Patient Instructions (Signed)
 A few things to remember from today's visit:  Type 2 diabetes mellitus with hyperglycemia, without long-term current use of insulin  (HCC) - Plan: POC HgB A1c, Microalbumin / creatinine urine ratio  Encounter for surveillance of contraceptive pills  If you need refills for medications you take chronically, please call your pharmacy. Do not use My Chart to request refills or for acute issues that need immediate attention. If you send a my chart message, it may take a few days to be addressed, specially if I am not in the office.  Please be sure medication list is accurate. If a new problem present, please set up appointment sooner than planned today.

## 2024-01-26 ENCOUNTER — Other Ambulatory Visit (INDEPENDENT_AMBULATORY_CARE_PROVIDER_SITE_OTHER): Payer: Self-pay

## 2024-01-26 DIAGNOSIS — E1165 Type 2 diabetes mellitus with hyperglycemia: Secondary | ICD-10-CM

## 2024-01-26 LAB — MICROALBUMIN / CREATININE URINE RATIO
Creatinine,U: 410.9 mg/dL
Microalb Creat Ratio: 10.6 mg/g (ref 0.0–30.0)
Microalb, Ur: 4.3 mg/dL — ABNORMAL HIGH (ref 0.0–1.9)

## 2024-01-26 LAB — BASIC METABOLIC PANEL WITH GFR
BUN: 13 mg/dL (ref 6–23)
CO2: 23 meq/L (ref 19–32)
Calcium: 8.8 mg/dL (ref 8.4–10.5)
Chloride: 106 meq/L (ref 96–112)
Creatinine, Ser: 0.73 mg/dL (ref 0.40–1.20)
GFR: 105.61 mL/min (ref >60.00)
Glucose, Bld: 130 mg/dL — ABNORMAL HIGH (ref 70–99)
Potassium: 4.4 meq/L (ref 3.5–5.1)
Sodium: 139 meq/L (ref 135–145)

## 2024-01-30 ENCOUNTER — Ambulatory Visit: Payer: Self-pay | Admitting: Family Medicine

## 2024-01-31 ENCOUNTER — Other Ambulatory Visit: Payer: Self-pay

## 2024-01-31 DIAGNOSIS — E1165 Type 2 diabetes mellitus with hyperglycemia: Secondary | ICD-10-CM

## 2024-01-31 MED ORDER — ONETOUCH VERIO VI STRP: ORAL_STRIP | 1 refills | Status: AC

## 2024-03-12 ENCOUNTER — Ambulatory Visit: Payer: Self-pay | Admitting: Family Medicine
